# Patient Record
Sex: Male | Born: 1960 | Race: White | Hispanic: No | Marital: Married | Smoking: Former smoker
Health system: Southern US, Community
[De-identification: ages and names within clinical notes are randomized; demographics above are authoritative.]

## PROBLEM LIST (undated history)

## (undated) DIAGNOSIS — M169 Osteoarthritis of hip, unspecified: Secondary | ICD-10-CM

## (undated) DIAGNOSIS — N200 Calculus of kidney: Secondary | ICD-10-CM

## (undated) DIAGNOSIS — K4090 Unilateral inguinal hernia, without obstruction or gangrene, not specified as recurrent: Secondary | ICD-10-CM

## (undated) DIAGNOSIS — I10 Essential (primary) hypertension: Secondary | ICD-10-CM

## (undated) DIAGNOSIS — R011 Cardiac murmur, unspecified: Secondary | ICD-10-CM

## (undated) DIAGNOSIS — E119 Type 2 diabetes mellitus without complications: Secondary | ICD-10-CM

## (undated) DIAGNOSIS — G4730 Sleep apnea, unspecified: Secondary | ICD-10-CM

## (undated) HISTORY — PX: COLONOSCOPY: SHX174

---

## 2008-06-10 ENCOUNTER — Ambulatory Visit: Payer: Self-pay | Admitting: Unknown Physician Specialty

## 2009-09-02 ENCOUNTER — Ambulatory Visit: Payer: Self-pay | Admitting: Unknown Physician Specialty

## 2010-10-01 DIAGNOSIS — Z794 Long term (current) use of insulin: Secondary | ICD-10-CM | POA: Insufficient documentation

## 2010-10-01 DIAGNOSIS — E7800 Pure hypercholesterolemia, unspecified: Secondary | ICD-10-CM | POA: Insufficient documentation

## 2010-10-01 DIAGNOSIS — I10 Essential (primary) hypertension: Secondary | ICD-10-CM | POA: Insufficient documentation

## 2010-12-10 ENCOUNTER — Ambulatory Visit: Payer: Self-pay | Admitting: Gastroenterology

## 2013-01-26 ENCOUNTER — Other Ambulatory Visit: Payer: Self-pay | Admitting: Orthopaedic Surgery

## 2013-01-26 DIAGNOSIS — M25552 Pain in left hip: Secondary | ICD-10-CM

## 2013-02-03 ENCOUNTER — Other Ambulatory Visit: Payer: Self-pay

## 2013-02-05 ENCOUNTER — Ambulatory Visit
Admission: RE | Admit: 2013-02-05 | Discharge: 2013-02-05 | Disposition: A | Discharge status: Home / Self Care | Payer: BC Managed Care – PPO | Source: Ambulatory Visit | Attending: Orthopaedic Surgery | Admitting: Orthopaedic Surgery

## 2013-02-05 DIAGNOSIS — M25552 Pain in left hip: Secondary | ICD-10-CM

## 2013-02-20 ENCOUNTER — Encounter (HOSPITAL_COMMUNITY): Payer: Self-pay | Admitting: Pharmacy Technician

## 2013-02-20 ENCOUNTER — Other Ambulatory Visit (HOSPITAL_COMMUNITY): Payer: Self-pay | Admitting: Orthopaedic Surgery

## 2013-02-21 ENCOUNTER — Encounter (HOSPITAL_COMMUNITY)
Admission: RE | Admit: 2013-02-21 | Discharge: 2013-02-21 | Disposition: A | Discharge status: Home / Self Care | Payer: BC Managed Care – PPO | Source: Ambulatory Visit | Attending: Orthopaedic Surgery | Admitting: Orthopaedic Surgery

## 2013-02-21 ENCOUNTER — Encounter (HOSPITAL_COMMUNITY): Payer: Self-pay

## 2013-02-21 DIAGNOSIS — Z01818 Encounter for other preprocedural examination: Secondary | ICD-10-CM | POA: Insufficient documentation

## 2013-02-21 DIAGNOSIS — Z01810 Encounter for preprocedural cardiovascular examination: Secondary | ICD-10-CM | POA: Insufficient documentation

## 2013-02-21 DIAGNOSIS — Z01811 Encounter for preprocedural respiratory examination: Secondary | ICD-10-CM | POA: Insufficient documentation

## 2013-02-21 DIAGNOSIS — Z01812 Encounter for preprocedural laboratory examination: Secondary | ICD-10-CM | POA: Insufficient documentation

## 2013-02-21 HISTORY — DX: Cardiac murmur, unspecified: R01.1

## 2013-02-21 HISTORY — DX: Calculus of kidney: N20.0

## 2013-02-21 HISTORY — DX: Osteoarthritis of hip, unspecified: M16.9

## 2013-02-21 HISTORY — DX: Unilateral inguinal hernia, without obstruction or gangrene, not specified as recurrent: K40.90

## 2013-02-21 HISTORY — DX: Essential (primary) hypertension: I10

## 2013-02-21 HISTORY — DX: Type 2 diabetes mellitus without complications: E11.9

## 2013-02-21 HISTORY — DX: Sleep apnea, unspecified: G47.30

## 2013-02-21 LAB — PROTIME-INR
INR: 0.93 (ref 0.00–1.49)
PROTHROMBIN TIME: 12.3 s (ref 11.6–15.2)

## 2013-02-21 LAB — TYPE AND SCREEN
ABO/RH(D): O POS
Antibody Screen: NEGATIVE

## 2013-02-21 LAB — SURGICAL PCR SCREEN
MRSA, PCR: NEGATIVE
Staphylococcus aureus: NEGATIVE

## 2013-02-21 LAB — COMPREHENSIVE METABOLIC PANEL
ALK PHOS: 94 U/L (ref 39–117)
ALT: 26 U/L (ref 0–53)
AST: 20 U/L (ref 0–37)
Albumin: 4.2 g/dL (ref 3.5–5.2)
BUN: 26 mg/dL — AB (ref 6–23)
CO2: 26 mEq/L (ref 19–32)
Calcium: 9.4 mg/dL (ref 8.4–10.5)
Chloride: 99 mEq/L (ref 96–112)
Creatinine, Ser: 0.91 mg/dL (ref 0.50–1.35)
GFR calc non Af Amer: >90 mL/min (ref >90)
GLUCOSE: 246 mg/dL — AB (ref 70–99)
POTASSIUM: 4 meq/L (ref 3.7–5.3)
Sodium: 140 mEq/L (ref 137–147)
Total Bilirubin: 0.2 mg/dL — ABNORMAL LOW (ref 0.3–1.2)
Total Protein: 7.5 g/dL (ref 6.0–8.3)

## 2013-02-21 LAB — CBC
HEMATOCRIT: 42.2 % (ref 39.0–52.0)
Hemoglobin: 14.7 g/dL (ref 13.0–17.0)
MCH: 30.8 pg (ref 26.0–34.0)
MCHC: 34.8 g/dL (ref 30.0–36.0)
MCV: 88.5 fL (ref 78.0–100.0)
PLATELETS: 249 10*3/uL (ref 150–400)
RBC: 4.77 MIL/uL (ref 4.22–5.81)
RDW: 12.7 % (ref 11.5–15.5)
WBC: 11.7 10*3/uL — ABNORMAL HIGH (ref 4.0–10.5)

## 2013-02-21 LAB — URINALYSIS, ROUTINE W REFLEX MICROSCOPIC
BILIRUBIN URINE: NEGATIVE
Ketones, ur: NEGATIVE mg/dL
Leukocytes, UA: NEGATIVE
Nitrite: NEGATIVE
Protein, ur: NEGATIVE mg/dL
Specific Gravity, Urine: 1.024 (ref 1.005–1.030)
UROBILINOGEN UA: 0.2 mg/dL (ref 0.0–1.0)
pH: 5.5 (ref 5.0–8.0)

## 2013-02-21 LAB — URINE MICROSCOPIC-ADD ON

## 2013-02-21 LAB — APTT: aPTT: 33 seconds (ref 24–37)

## 2013-02-21 LAB — ABO/RH: ABO/RH(D): O POS

## 2013-02-21 NOTE — Pre-Procedure Instructions (Signed)
[name] CampbellDaniel [name]  02/21/2013   Your procedure is scheduled on:  Friday, March 02, 2013 at 7:30 AM  [facility] (use Main Entrance "A'') at 5:30 AM.  Call this number if you have problems the morning of surgery: [phone]   Remember:   Do not eat food or drink liquids after midnight.   Take these medicines the morning of surgery with A SIP OF WATER: allopurinol (ZYLOPRIM) 100 MG tablet, carvedilol (COREG) 6.25 MG tablet,  Stop taking Aspirin and herbal medications. Do not take any NSAIDs ie: Ibuprofen, Advil, Naproxen ( Anaprox) or any medication containing Aspirin   Do not wear jewelry, make-up or nail polish.  Do not wear lotions, powders, or perfumes. You may wear deodorant.  Do not shave 48 hours prior to surgery. Men may shave face and neck.  Do not bring valuables to the hospital.  [facility] is not responsible for any belongings or valuables.               Contacts, dentures or bridgework may not be worn into surgery.  Leave suitcase in the car. After surgery it may be brought to your room.  For patients admitted to the hospital, discharge time is determined by your treatment team.               Patients discharged the day of surgery will not be allowed to drive home.  Name and phone number of your driver:   Special Instructions: Shower using CHG the night before surgery and the morning of surgery.   Please read over the following fact sheets that you were given: Pain Booklet, Coughing and Deep Breathing, Blood Transfusion Information, Total Joint Packet, MRSA Information and Surgical Site Infection Prevention

## 2013-03-01 MED ORDER — VANCOMYCIN HCL 10 G IV SOLR
1500.0000 mg | INTRAVENOUS | Status: AC
  Administered 2013-03-02: 1500 mg via INTRAVENOUS
  Filled 2013-03-01: qty 1500

## 2013-03-01 NOTE — H&P (Signed)
TOTAL HIP ADMISSION H&P  Patient is admitted for left total hip arthroplasty.  Subjective:  Chief Complaint: left hip pain  HPI: [name] [name], 53 y.o. male, has a history of pain and functional disability in the left hip(s) due to arthritis and patient has failed non-surgical conservative treatments for greater than 12 weeks to include NSAID's and/or analgesics, corticosteriod injections and activity modification.  Onset of symptoms was gradual starting 2 years ago with gradually worsening course since that time.The patient noted no past surgery on the left hip(s).  Patient currently rates pain in the left hip at 6 out of 10 with activity. Patient has night pain, worsening of pain with activity and weight bearing and pain that interfers with activities of daily living. Patient has evidence of subchondral sclerosis, periarticular osteophytes and joint space narrowing by imaging studies. This condition presents safety issues increasing the risk of falls.  There is no current active infection.  There are no active problems to display for this patient.  Past Medical History  Diagnosis Date  . Hypertension   . Diabetes mellitus without complication   . OA (osteoarthritis) of hip     Hx: of  . Heart murmur     Hx: of as achild  . Sleep apnea   . Kidney stones     Hx: of  . Inguinal hernia     Hx: of left groin    Past Surgical History  Procedure Laterality Date  . Colonoscopy      Hx: of    No prescriptions prior to admission   Allergies  Allergen Reactions  . Shellfish Allergy Anaphylaxis  . Penicillins Hives    1977    History  Substance Use Topics  . Smoking status: Former Smoker    Types: Cigars, Cigarettes  . Smokeless tobacco: Current User    Types: Snuff     Comment: Quit smoking 1998  . Alcohol Use: Yes     Comment: once a week     Family History  Problem Relation Age of Onset  . Diabetes Mother   . Hypertension Mother   . Cancer - Other Mother   . Cancer -  Lung Father   . Arthritis Sister   . Heart disease Sister      Review of Systems  Musculoskeletal: Positive for joint pain.  All other systems reviewed and are negative.    Objective:  Physical Exam  Constitutional: He is oriented to person, place, and time. He appears well-developed and well-nourished.  HENT:  Head: Normocephalic and atraumatic.  Eyes: EOM are normal. Pupils are equal, round, and reactive to light.  Neck: Normal range of motion.  Cardiovascular: Normal rate.   Respiratory: Effort normal.  GI: Soft.  Musculoskeletal:  Painful and limited ROM of left hip. -SLR.  No focal weakness of LEs.  Neurological: He is alert and oriented to person, place, and time.  Skin: Skin is warm and dry.    Vital signs in last 24 hours:    Labs:   There is no height or weight on file to calculate BMI.   Imaging Review Plain radiographs demonstrate moderate degenerative joint disease of the left hip(s). The bone quality appears to be adequate for age and reported activity level.  Assessment/Plan:  End stage arthritis, left hip(s)  The patient history, physical examination, clinical judgement of the provider and imaging studies are consistent with end stage degenerative joint disease of the left hip(s) and total hip arthroplasty is deemed medically necessary.  The treatment options including medical management, injection therapy, arthroscopy and arthroplasty were discussed at length. The risks and benefits of total hip arthroplasty were presented and reviewed. The risks due to aseptic loosening, infection, stiffness, dislocation/subluxation,  thromboembolic complications and other imponderables were discussed.  The patient acknowledged the explanation, agreed to proceed with the plan and consent was signed. Patient is being admitted for inpatient treatment for surgery, pain control, PT, OT, prophylactic antibiotics, VTE prophylaxis, progressive ambulation and ADL's and discharge  planning.The patient is planning to be discharged home with home health services

## 2013-03-02 ENCOUNTER — Inpatient Hospital Stay (HOSPITAL_COMMUNITY): Payer: BC Managed Care – PPO | Admitting: Certified Registered Nurse Anesthetist

## 2013-03-02 ENCOUNTER — Inpatient Hospital Stay (HOSPITAL_COMMUNITY)
Admission: RE | Admit: 2013-03-02 | Discharge: 2013-03-05 | DRG: 470 | Disposition: A | Discharge status: Home / Self Care | Payer: BC Managed Care – PPO | Source: Ambulatory Visit | Attending: Orthopaedic Surgery | Admitting: Orthopaedic Surgery

## 2013-03-02 ENCOUNTER — Encounter (HOSPITAL_COMMUNITY)
Admission: RE | Disposition: A | Discharge status: Home / Self Care | Payer: Self-pay | Source: Ambulatory Visit | Attending: Orthopaedic Surgery

## 2013-03-02 ENCOUNTER — Inpatient Hospital Stay (HOSPITAL_COMMUNITY): Payer: BC Managed Care – PPO

## 2013-03-02 ENCOUNTER — Encounter (HOSPITAL_COMMUNITY): Payer: Self-pay | Admitting: Anesthesiology

## 2013-03-02 ENCOUNTER — Encounter (HOSPITAL_COMMUNITY): Payer: BC Managed Care – PPO | Admitting: Certified Registered Nurse Anesthetist

## 2013-03-02 DIAGNOSIS — M1610 Unilateral primary osteoarthritis, unspecified hip: Principal | ICD-10-CM | POA: Diagnosis present

## 2013-03-02 DIAGNOSIS — G4730 Sleep apnea, unspecified: Secondary | ICD-10-CM | POA: Diagnosis present

## 2013-03-02 DIAGNOSIS — I10 Essential (primary) hypertension: Secondary | ICD-10-CM | POA: Diagnosis present

## 2013-03-02 DIAGNOSIS — E119 Type 2 diabetes mellitus without complications: Secondary | ICD-10-CM | POA: Diagnosis present

## 2013-03-02 DIAGNOSIS — Z87891 Personal history of nicotine dependence: Secondary | ICD-10-CM

## 2013-03-02 DIAGNOSIS — M1611 Unilateral primary osteoarthritis, right hip: Secondary | ICD-10-CM | POA: Diagnosis present

## 2013-03-02 DIAGNOSIS — M169 Osteoarthritis of hip, unspecified: Principal | ICD-10-CM | POA: Diagnosis present

## 2013-03-02 DIAGNOSIS — M1612 Unilateral primary osteoarthritis, left hip: Secondary | ICD-10-CM

## 2013-03-02 HISTORY — PX: TOTAL HIP ARTHROPLASTY: SHX124

## 2013-03-02 LAB — GLUCOSE, CAPILLARY
GLUCOSE-CAPILLARY: 122 mg/dL — AB (ref 70–99)
GLUCOSE-CAPILLARY: 98 mg/dL (ref 70–99)
GLUCOSE-CAPILLARY: 223 mg/dL — AB (ref 70–99)
Glucose-Capillary: 114 mg/dL — ABNORMAL HIGH (ref 70–99)
Glucose-Capillary: 164 mg/dL — ABNORMAL HIGH (ref 70–99)

## 2013-03-02 SURGERY — ARTHROPLASTY, HIP, TOTAL, ANTERIOR APPROACH
Anesthesia: General | Site: Hip | Laterality: Left

## 2013-03-02 MED ORDER — CARVEDILOL 6.25 MG PO TABS
6.2500 mg | ORAL_TABLET | Freq: Two times a day (BID) | ORAL | Status: DC
  Administered 2013-03-02 – 2013-03-05 (×6): 6.25 mg via ORAL
  Filled 2013-03-02 (×8): qty 1

## 2013-03-02 MED ORDER — FLEET ENEMA 7-19 GM/118ML RE ENEM: 1.0000 | ENEMA | Freq: Once | RECTAL | Status: AC | PRN

## 2013-03-02 MED ORDER — METHOCARBAMOL 100 MG/ML IJ SOLN
500.0000 mg | Freq: Four times a day (QID) | INTRAVENOUS | Status: DC | PRN
  Filled 2013-03-02: qty 5

## 2013-03-02 MED ORDER — ACETAMINOPHEN 650 MG RE SUPP: 650.0000 mg | Freq: Four times a day (QID) | RECTAL | Status: DC | PRN

## 2013-03-02 MED ORDER — PROPOFOL 10 MG/ML IV BOLUS
INTRAVENOUS | Status: AC
  Filled 2013-03-02: qty 20

## 2013-03-02 MED ORDER — LIDOCAINE HCL (CARDIAC) 20 MG/ML IV SOLN
INTRAVENOUS | Status: DC | PRN
  Administered 2013-03-02: 80 mg via INTRAVENOUS

## 2013-03-02 MED ORDER — KETOROLAC TROMETHAMINE 15 MG/ML IJ SOLN
15.0000 mg | Freq: Three times a day (TID) | INTRAMUSCULAR | Status: AC
  Administered 2013-03-02 – 2013-03-03 (×3): 15 mg via INTRAVENOUS
  Filled 2013-03-02 (×3): qty 1

## 2013-03-02 MED ORDER — HYDROMORPHONE HCL PF 1 MG/ML IJ SOLN
INTRAMUSCULAR | Status: AC
  Filled 2013-03-02: qty 1

## 2013-03-02 MED ORDER — METFORMIN HCL 500 MG PO TABS
2000.0000 mg | ORAL_TABLET | Freq: Two times a day (BID) | ORAL | Status: DC
  Administered 2013-03-03: 1000 mg via ORAL
  Filled 2013-03-02 (×3): qty 4

## 2013-03-02 MED ORDER — ZOLPIDEM TARTRATE 5 MG PO TABS: 5.0000 mg | ORAL_TABLET | Freq: Every evening | ORAL | Status: DC | PRN

## 2013-03-02 MED ORDER — ONDANSETRON HCL 4 MG/2ML IJ SOLN
INTRAMUSCULAR | Status: AC
  Filled 2013-03-02: qty 2

## 2013-03-02 MED ORDER — SUCCINYLCHOLINE CHLORIDE 20 MG/ML IJ SOLN
INTRAMUSCULAR | Status: AC
  Filled 2013-03-02: qty 1

## 2013-03-02 MED ORDER — GLIMEPIRIDE 4 MG PO TABS
4.0000 mg | ORAL_TABLET | Freq: Every day | ORAL | Status: DC
  Administered 2013-03-03 – 2013-03-05 (×3): 4 mg via ORAL
  Filled 2013-03-02 (×4): qty 1

## 2013-03-02 MED ORDER — BISACODYL 10 MG RE SUPP: 10.0000 mg | Freq: Every day | RECTAL | Status: DC | PRN

## 2013-03-02 MED ORDER — SODIUM CHLORIDE 0.9 % IJ SOLN
INTRAMUSCULAR | Status: AC
  Filled 2013-03-02: qty 10

## 2013-03-02 MED ORDER — METHOCARBAMOL 500 MG PO TABS
ORAL_TABLET | ORAL | Status: AC
  Filled 2013-03-02: qty 1

## 2013-03-02 MED ORDER — LACTATED RINGERS IV SOLN
INTRAVENOUS | Status: DC | PRN
  Administered 2013-03-02 (×3): via INTRAVENOUS

## 2013-03-02 MED ORDER — ATORVASTATIN CALCIUM 10 MG PO TABS
10.0000 mg | ORAL_TABLET | Freq: Every day | ORAL | Status: DC
  Administered 2013-03-02 – 2013-03-05 (×4): 10 mg via ORAL
  Filled 2013-03-02 (×4): qty 1

## 2013-03-02 MED ORDER — EPHEDRINE SULFATE 50 MG/ML IJ SOLN
INTRAMUSCULAR | Status: AC
  Filled 2013-03-02: qty 1

## 2013-03-02 MED ORDER — LOSARTAN POTASSIUM 50 MG PO TABS
100.0000 mg | ORAL_TABLET | Freq: Every day | ORAL | Status: DC
  Administered 2013-03-02 – 2013-03-05 (×4): 100 mg via ORAL
  Filled 2013-03-02 (×4): qty 2

## 2013-03-02 MED ORDER — ONDANSETRON HCL 4 MG/2ML IJ SOLN
INTRAMUSCULAR | Status: DC | PRN
  Administered 2013-03-02: 4 mg via INTRAVENOUS

## 2013-03-02 MED ORDER — LIDOCAINE HCL (CARDIAC) 20 MG/ML IV SOLN
INTRAVENOUS | Status: AC
  Filled 2013-03-02: qty 5

## 2013-03-02 MED ORDER — 0.9 % SODIUM CHLORIDE (POUR BTL) OPTIME
TOPICAL | Status: DC | PRN
  Administered 2013-03-02: 1000 mL

## 2013-03-02 MED ORDER — MIDAZOLAM HCL 5 MG/5ML IJ SOLN
INTRAMUSCULAR | Status: DC | PRN
  Administered 2013-03-02: 2 mg via INTRAVENOUS

## 2013-03-02 MED ORDER — FENTANYL CITRATE 0.05 MG/ML IJ SOLN
INTRAMUSCULAR | Status: DC | PRN
  Administered 2013-03-02 (×2): 100 ug via INTRAVENOUS
  Administered 2013-03-02: 50 ug via INTRAVENOUS
  Administered 2013-03-02 (×2): 100 ug via INTRAVENOUS
  Administered 2013-03-02: 50 ug via INTRAVENOUS

## 2013-03-02 MED ORDER — ONDANSETRON HCL 4 MG PO TABS: 4.0000 mg | ORAL_TABLET | Freq: Four times a day (QID) | ORAL | Status: DC | PRN

## 2013-03-02 MED ORDER — SENNOSIDES-DOCUSATE SODIUM 8.6-50 MG PO TABS: 1.0000 | ORAL_TABLET | Freq: Every evening | ORAL | Status: DC | PRN

## 2013-03-02 MED ORDER — FENTANYL CITRATE 0.05 MG/ML IJ SOLN
INTRAMUSCULAR | Status: AC
  Filled 2013-03-02: qty 5

## 2013-03-02 MED ORDER — METHOCARBAMOL 500 MG PO TABS
500.0000 mg | ORAL_TABLET | Freq: Four times a day (QID) | ORAL | Status: DC | PRN
  Administered 2013-03-02 – 2013-03-05 (×7): 500 mg via ORAL
  Filled 2013-03-02 (×6): qty 1

## 2013-03-02 MED ORDER — PROPOFOL 10 MG/ML IV BOLUS
INTRAVENOUS | Status: DC | PRN
  Administered 2013-03-02: 330 mg via INTRAVENOUS

## 2013-03-02 MED ORDER — HYDROMORPHONE HCL PF 1 MG/ML IJ SOLN
0.2500 mg | INTRAMUSCULAR | Status: DC | PRN
  Administered 2013-03-02 (×4): 0.5 mg via INTRAVENOUS

## 2013-03-02 MED ORDER — METOCLOPRAMIDE HCL 10 MG PO TABS: 5.0000 mg | ORAL_TABLET | Freq: Three times a day (TID) | ORAL | Status: DC | PRN

## 2013-03-02 MED ORDER — GLYCOPYRROLATE 0.2 MG/ML IJ SOLN
INTRAMUSCULAR | Status: AC
  Filled 2013-03-02: qty 2

## 2013-03-02 MED ORDER — INSULIN GLARGINE 100 UNIT/ML ~~LOC~~ SOLN
25.0000 [IU] | Freq: Every day | SUBCUTANEOUS | Status: DC
  Administered 2013-03-03 – 2013-03-05 (×3): 25 [IU] via SUBCUTANEOUS
  Filled 2013-03-02 (×3): qty 0.25

## 2013-03-02 MED ORDER — INSULIN ASPART 100 UNIT/ML ~~LOC~~ SOLN
0.0000 [IU] | Freq: Three times a day (TID) | SUBCUTANEOUS | Status: DC
  Administered 2013-03-02 – 2013-03-03 (×2): 3 [IU] via SUBCUTANEOUS
  Administered 2013-03-03 – 2013-03-04 (×3): 2 [IU] via SUBCUTANEOUS
  Administered 2013-03-04 – 2013-03-05 (×2): 3 [IU] via SUBCUTANEOUS

## 2013-03-02 MED ORDER — ASPIRIN EC 325 MG PO TBEC: 325.0000 mg | DELAYED_RELEASE_TABLET | Freq: Every day | ORAL | Status: DC

## 2013-03-02 MED ORDER — OXYCODONE-ACETAMINOPHEN 5-325 MG PO TABS: 1.0000 | ORAL_TABLET | ORAL | Status: DC | PRN

## 2013-03-02 MED ORDER — CHLORHEXIDINE GLUCONATE 4 % EX LIQD: 60.0000 mL | Freq: Once | CUTANEOUS | Status: DC

## 2013-03-02 MED ORDER — MENTHOL 3 MG MT LOZG
1.0000 | LOZENGE | OROMUCOSAL | Status: DC | PRN
  Filled 2013-03-02: qty 9

## 2013-03-02 MED ORDER — ROCURONIUM BROMIDE 50 MG/5ML IV SOLN
INTRAVENOUS | Status: AC
  Filled 2013-03-02: qty 1

## 2013-03-02 MED ORDER — ASPIRIN EC 325 MG PO TBEC
325.0000 mg | DELAYED_RELEASE_TABLET | Freq: Every day | ORAL | Status: DC
  Administered 2013-03-03 – 2013-03-05 (×3): 325 mg via ORAL
  Filled 2013-03-02 (×4): qty 1

## 2013-03-02 MED ORDER — BUPIVACAINE HCL (PF) 0.25 % IJ SOLN
INTRAMUSCULAR | Status: AC
  Filled 2013-03-02: qty 30

## 2013-03-02 MED ORDER — MIDAZOLAM HCL 2 MG/2ML IJ SOLN
INTRAMUSCULAR | Status: AC
  Filled 2013-03-02: qty 2

## 2013-03-02 MED ORDER — ONDANSETRON HCL 4 MG/2ML IJ SOLN: 4.0000 mg | Freq: Four times a day (QID) | INTRAMUSCULAR | Status: DC | PRN

## 2013-03-02 MED ORDER — KETOROLAC TROMETHAMINE 30 MG/ML IJ SOLN
INTRAMUSCULAR | Status: AC
  Filled 2013-03-02: qty 1

## 2013-03-02 MED ORDER — ALLOPURINOL 100 MG PO TABS
100.0000 mg | ORAL_TABLET | Freq: Every day | ORAL | Status: DC
  Administered 2013-03-02 – 2013-03-05 (×4): 100 mg via ORAL
  Filled 2013-03-02 (×4): qty 1

## 2013-03-02 MED ORDER — METOCLOPRAMIDE HCL 5 MG/ML IJ SOLN: 5.0000 mg | Freq: Three times a day (TID) | INTRAMUSCULAR | Status: DC | PRN

## 2013-03-02 MED ORDER — POTASSIUM CHLORIDE IN NACL 20-0.45 MEQ/L-% IV SOLN
INTRAVENOUS | Status: DC
  Administered 2013-03-02 – 2013-03-03 (×2): via INTRAVENOUS
  Filled 2013-03-02 (×7): qty 1000

## 2013-03-02 MED ORDER — ACETAMINOPHEN 325 MG PO TABS
650.0000 mg | ORAL_TABLET | Freq: Four times a day (QID) | ORAL | Status: DC | PRN
  Administered 2013-03-03 – 2013-03-04 (×3): 650 mg via ORAL
  Filled 2013-03-02 (×3): qty 2

## 2013-03-02 MED ORDER — EPHEDRINE SULFATE 50 MG/ML IJ SOLN
INTRAMUSCULAR | Status: DC | PRN
  Administered 2013-03-02 (×2): 10 mg via INTRAVENOUS

## 2013-03-02 MED ORDER — METHOCARBAMOL 500 MG PO TABS
500.0000 mg | ORAL_TABLET | Freq: Four times a day (QID) | ORAL | Status: DC | PRN
Start: 2013-03-02 — End: 2014-06-01

## 2013-03-02 MED ORDER — GLYCOPYRROLATE 0.2 MG/ML IJ SOLN
INTRAMUSCULAR | Status: DC | PRN
  Administered 2013-03-02: 0.4 mg via INTRAVENOUS

## 2013-03-02 MED ORDER — HYDROCHLOROTHIAZIDE 25 MG PO TABS
25.0000 mg | ORAL_TABLET | Freq: Every day | ORAL | Status: DC
  Administered 2013-03-02 – 2013-03-05 (×4): 25 mg via ORAL
  Filled 2013-03-02 (×4): qty 1

## 2013-03-02 MED ORDER — NEOSTIGMINE METHYLSULFATE 1 MG/ML IJ SOLN
INTRAMUSCULAR | Status: DC | PRN
Start: 2013-03-02 — End: 2013-03-02
  Administered 2013-03-02: 3 mg via INTRAVENOUS

## 2013-03-02 MED ORDER — ACETAMINOPHEN 10 MG/ML IV SOLN
INTRAVENOUS | Status: AC
  Filled 2013-03-02: qty 100

## 2013-03-02 MED ORDER — INSULIN GLARGINE 100 UNIT/ML ~~LOC~~ SOLN
75.0000 [IU] | Freq: Every day | SUBCUTANEOUS | Status: DC
  Administered 2013-03-02 – 2013-03-04 (×3): 75 [IU] via SUBCUTANEOUS
  Filled 2013-03-02 (×4): qty 0.75

## 2013-03-02 MED ORDER — MORPHINE SULFATE 2 MG/ML IJ SOLN: 2.0000 mg | INTRAMUSCULAR | Status: DC | PRN

## 2013-03-02 MED ORDER — ONDANSETRON HCL 4 MG/2ML IJ SOLN: 4.0000 mg | Freq: Once | INTRAMUSCULAR | Status: DC | PRN

## 2013-03-02 MED ORDER — PHENOL 1.4 % MT LIQD: 1.0000 | OROMUCOSAL | Status: DC | PRN

## 2013-03-02 MED ORDER — ACETAMINOPHEN 10 MG/ML IV SOLN
INTRAVENOUS | Status: DC | PRN
  Administered 2013-03-02: 1000 mg via INTRAVENOUS

## 2013-03-02 MED ORDER — SUCCINYLCHOLINE CHLORIDE 20 MG/ML IJ SOLN
INTRAMUSCULAR | Status: DC | PRN
  Administered 2013-03-02: 110 mg via INTRAVENOUS

## 2013-03-02 MED ORDER — DOCUSATE SODIUM 100 MG PO CAPS
100.0000 mg | ORAL_CAPSULE | Freq: Two times a day (BID) | ORAL | Status: DC
  Administered 2013-03-02 – 2013-03-04 (×3): 100 mg via ORAL
  Filled 2013-03-02 (×7): qty 1

## 2013-03-02 MED ORDER — OXYCODONE HCL 5 MG PO TABS
ORAL_TABLET | ORAL | Status: AC
  Filled 2013-03-02: qty 2

## 2013-03-02 MED ORDER — ROCURONIUM BROMIDE 100 MG/10ML IV SOLN
INTRAVENOUS | Status: DC | PRN
  Administered 2013-03-02 (×2): 50 mg via INTRAVENOUS

## 2013-03-02 MED ORDER — NEOSTIGMINE METHYLSULFATE 1 MG/ML IJ SOLN
INTRAMUSCULAR | Status: AC
  Filled 2013-03-02: qty 10

## 2013-03-02 MED ORDER — OXYCODONE HCL 5 MG PO TABS
5.0000 mg | ORAL_TABLET | ORAL | Status: DC | PRN
  Administered 2013-03-02 – 2013-03-05 (×11): 10 mg via ORAL
  Filled 2013-03-02 (×10): qty 2

## 2013-03-02 SURGICAL SUPPLY — 51 items
BLADE SAW SGTL 18X1.27X75 (BLADE) ×2 IMPLANT
BLADE SURG ROTATE 9660 (MISCELLANEOUS) IMPLANT
CAPT HIP PF MOP ×2 IMPLANT
CELLS DAT CNTRL 66122 CELL SVR (MISCELLANEOUS) ×1 IMPLANT
CLOTH BEACON ORANGE TIMEOUT ST (SAFETY) ×2 IMPLANT
COVER SURGICAL LIGHT HANDLE (MISCELLANEOUS) ×2 IMPLANT
DERMABOND ADHESIVE PROPEN (GAUZE/BANDAGES/DRESSINGS) ×1
DERMABOND ADVANCED .7 DNX6 (GAUZE/BANDAGES/DRESSINGS) ×1 IMPLANT
DRAPE C-ARM 42X72 X-RAY (DRAPES) ×2 IMPLANT
DRAPE STERI IOBAN 125X83 (DRAPES) ×2 IMPLANT
DRAPE U-SHAPE 47X51 STRL (DRAPES) ×6 IMPLANT
DRSG MEPILEX BORDER 4X8 (GAUZE/BANDAGES/DRESSINGS) ×2 IMPLANT
DURAPREP 26ML APPLICATOR (WOUND CARE) ×2 IMPLANT
ELECT BLADE 4.0 EZ CLEAN MEGAD (MISCELLANEOUS)
ELECT BLADE TIP CTD 4 INCH (ELECTRODE) ×2 IMPLANT
ELECT CAUTERY BLADE 6.4 (BLADE) ×2 IMPLANT
ELECT REM PT RETURN 9FT ADLT (ELECTROSURGICAL) ×2
ELECTRODE BLDE 4.0 EZ CLN MEGD (MISCELLANEOUS) IMPLANT
ELECTRODE REM PT RTRN 9FT ADLT (ELECTROSURGICAL) ×1 IMPLANT
FACESHIELD LNG OPTICON STERILE (SAFETY) ×4 IMPLANT
GAUZE XEROFORM 1X8 LF (GAUZE/BANDAGES/DRESSINGS) ×2 IMPLANT
GLOVE BIOGEL PI IND STRL 7.5 (GLOVE) ×1 IMPLANT
GLOVE BIOGEL PI IND STRL 8 (GLOVE) ×1 IMPLANT
GLOVE BIOGEL PI INDICATOR 7.5 (GLOVE) ×1
GLOVE BIOGEL PI INDICATOR 8 (GLOVE) ×1
GLOVE ECLIPSE 7.0 STRL STRAW (GLOVE) ×2 IMPLANT
GLOVE ORTHO TXT STRL SZ7.5 (GLOVE) ×2 IMPLANT
GOWN PREVENTION PLUS LG XLONG (DISPOSABLE) IMPLANT
GOWN STRL NON-REIN LRG LVL3 (GOWN DISPOSABLE) ×4 IMPLANT
GOWN STRL REIN XL XLG (GOWN DISPOSABLE) ×2 IMPLANT
KIT BASIN OR (CUSTOM PROCEDURE TRAY) ×2 IMPLANT
KIT ROOM TURNOVER OR (KITS) ×2 IMPLANT
MANIFOLD NEPTUNE II (INSTRUMENTS) ×2 IMPLANT
NS IRRIG 1000ML POUR BTL (IV SOLUTION) ×2 IMPLANT
PACK TOTAL JOINT (CUSTOM PROCEDURE TRAY) ×2 IMPLANT
PAD ARMBOARD 7.5X6 YLW CONV (MISCELLANEOUS) ×4 IMPLANT
RTRCTR WOUND ALEXIS 18CM MED (MISCELLANEOUS) ×2
SPONGE LAP 18X18 X RAY DECT (DISPOSABLE) ×2 IMPLANT
SPONGE LAP 4X18 X RAY DECT (DISPOSABLE) IMPLANT
STAPLER VISISTAT 35W (STAPLE) ×2 IMPLANT
SUT ETHIBOND NAB CT1 #1 30IN (SUTURE) IMPLANT
SUT VIC AB 0 CT1 27 (SUTURE) ×1
SUT VIC AB 0 CT1 27XBRD ANBCTR (SUTURE) ×1 IMPLANT
SUT VIC AB 2-0 CT1 27 (SUTURE) ×1
SUT VIC AB 2-0 CT1 TAPERPNT 27 (SUTURE) ×1 IMPLANT
SUT VICRYL 4-0 PS2 18IN ABS (SUTURE) ×2 IMPLANT
SUT VLOC 180 0 24IN GS25 (SUTURE) ×2 IMPLANT
TOWEL OR 17X24 6PK STRL BLUE (TOWEL DISPOSABLE) ×2 IMPLANT
TOWEL OR 17X26 10 PK STRL BLUE (TOWEL DISPOSABLE) ×4 IMPLANT
TRAY FOLEY CATH 16FRSI W/METER (SET/KITS/TRAYS/PACK) IMPLANT
WATER STERILE IRR 1000ML POUR (IV SOLUTION) ×4 IMPLANT

## 2013-03-02 NOTE — Brief Op Note (Cosign Needed)
03/02/2013  10:06 AM  PATIENT:  [name] [name]  53 y.o. male  PRE-OPERATIVE DIAGNOSIS:  Osteoarthritis Left Hip  POST-OPERATIVE DIAGNOSIS:  Osteoarthritis Left Hip  PROCEDURE:  Procedure(s) with comments: TOTAL HIP ARTHROPLASTY ANTERIOR APPROACH (Left) - Left Total Hip Arthroplasty Direct Anterior Approach  SURGEON:  Surgeon(s) and Role:    * Eldred MangesMark C Yates, MD - Primary  PHYSICIAN ASSISTANT: Maud DeedSheila Naeem Quillin [facility]  ASSISTANTS: none   ANESTHESIA:   general  EBL:  Total I/O In: 1000 [I.V.:1000] Out: -   BLOOD ADMINISTERED:none  DRAINS: none   LOCAL MEDICATIONS USED:  MARCAINE     SPECIMEN:  No Specimen  DISPOSITION OF SPECIMEN:  N/A  COUNTS:  YES  TOURNIQUET:  * No tourniquets in log *  DICTATION: .Note written in EPIC  PLAN OF CARE: Admit to inpatient   PATIENT DISPOSITION:  PACU - hemodynamically stable.   Delay start of Pharmacological VTE agent (>24hrs) due to surgical blood loss or risk of bleeding: no

## 2013-03-02 NOTE — Evaluation (Signed)
Physical Therapy Evaluation Patient Details Name: [name] [name] MRN: [id number] DOB: Jan 15, 1961 Today's Date: 03/02/2013 Time: 8295-6213 PT Time Calculation (min): 29 min  PT Assessment / Plan / Recommendation History of Present Illness  Patient is a 53 yo male s/p Lt THA (direct anterior approach - no precautions).  Clinical Impression  Patient presents with problems listed below.  Will benefit from acute PT to maximize functional independence prior to discharge home with wife.    PT Assessment  Patient needs continued PT services    Follow Up Recommendations  Home health PT;Supervision/Assistance - 24 hour    Does the patient have the potential to tolerate intense rehabilitation      Barriers to Discharge        Equipment Recommendations  Rolling walker with 5" wheels;3in1 (PT) (Bariatric equipment (patient is 6'2" and obese))    Recommendations for Other Services     Frequency 7X/week    Precautions / Restrictions Precautions Precautions: Fall Restrictions Weight Bearing Restrictions: Yes LLE Weight Bearing: Weight bearing as tolerated   Pertinent Vitals/Pain       Mobility  Bed Mobility Overal bed mobility: Needs Assistance;+2 for physical assistance Bed Mobility: Supine to Sit Supine to sit: Max assist;+2 for physical assistance General bed mobility comments: Verbal cues for mobility.  Assist to move LLE off of bed.  +2 assist to raise trunk to sitting position.  Once upright with feet on floor, patient able to maintain balance with standby assist. Transfers Overall transfer level: Needs assistance Equipment used: Rolling walker (2 wheeled) Transfers: Sit to/from Stand Sit to Stand: Mod assist;+2 physical assistance;From elevated surface General transfer comment: Raised bed to elevated level to assist with sit > stand.  Verbal cues for hand placement and technique.  Assist to rise to standing and for balance. Ambulation/Gait Ambulation/Gait assistance: Min  assist;+2 physical assistance Ambulation Distance (Feet): 4 Feet Assistive device: Rolling walker (2 wheeled) Gait Pattern/deviations: Step-to pattern;Decreased stance time - left;Decreased step length - right;Decreased weight shift to left;Antalgic;Trunk flexed Gait velocity: Slow General Gait Details: Verbal cues for safe use of RW and gait sequence.  Patient able to maneuver RW.  Patient able to ambulate 4' to chair.  Patient stood at chair x2 minutes to void in urinal.  Assist to control descent into chair.    Exercises Total Joint Exercises Ankle Circles/Pumps: AROM;Both;10 reps;Seated   PT Diagnosis: Difficulty walking;Acute pain  PT Problem List: Decreased strength;Decreased activity tolerance;Decreased balance;Decreased mobility;Decreased knowledge of use of DME;Obesity;Pain PT Treatment Interventions: DME instruction;Gait training;Stair training;Functional mobility training;Therapeutic exercise;Patient/family education     PT Goals(Current goals can be found in the care plan section) Acute Rehab PT Goals Patient Stated Goal: To return home PT Goal Formulation: With patient/family Time For Goal Achievement: 03/09/13 Potential to Achieve Goals: Good  Visit Information  Last PT Received On: 03/02/13 Assistance Needed: +2 History of Present Illness: Patient is a 53 yo male s/p Lt THA (direct anterior approach - no precautions).       Prior Functioning  Home Living Family/patient expects to be discharged to:: Private residence Living Arrangements: Spouse/significant other Available Help at Discharge: Family;Available 24 hours/day Type of Home: House Home Access: Stairs to enter [employer] of Steps: 4 (2 steps with no rail) Entrance Stairs-Rails: Right;Left Home Layout: Two level (Bedroom/bath down 3 steps with no rails) Alternate Level Stairs-Number of Steps: 3 Alternate Level Stairs-Rails: None Home Equipment: None Prior Function Level of Independence:  Independent Communication Communication: No difficulties    Cognition  Cognition Arousal/Alertness: Awake/alert Behavior During Therapy: WFL for tasks assessed/performed Overall Cognitive Status: Within Functional Limits for tasks assessed    Extremity/Trunk Assessment Upper Extremity Assessment Upper Extremity Assessment: Overall WFL for tasks assessed Lower Extremity Assessment Lower Extremity Assessment: LLE deficits/detail LLE Deficits / Details: Decreased strength and ROM due to surgery/pain.  Strength grossly 3-/5 LLE: Unable to fully assess due to pain LLE Coordination: decreased gross motor Cervical / Trunk Assessment Cervical / Trunk Assessment: Normal   Balance Balance Overall balance assessment: Needs assistance Sitting-balance support: No upper extremity supported;Feet supported Sitting balance-Leahy Scale: Good Standing balance support: Bilateral upper extremity supported Standing balance-Leahy Scale: Fair  End of Session PT - End of Session Equipment Utilized During Treatment: Gait belt;Oxygen Activity Tolerance: Patient limited by pain;Patient limited by fatigue Patient left: in chair;with call bell/phone within reach;with family/visitor present Nurse Communication: Mobility status  GP     Vena AustriaDavis, Myrtle Haller H 03/02/2013, 4:26 PM Durenda HurtSusan H. Renaldo Fiddleravis, PT, [facility] Acute Rehab Services Pager [phone]

## 2013-03-02 NOTE — Transfer of Care (Signed)
Immediate Anesthesia Transfer of Care Note  Patient: [name] CampbellDaniel [name]  Procedure(s) Performed: Procedure(s) with comments: TOTAL HIP ARTHROPLASTY ANTERIOR APPROACH (Left) - Left Total Hip Arthroplasty Direct Anterior Approach  Patient Location: PACU  Anesthesia Type:General  Level of Consciousness: awake, alert , oriented and patient cooperative  Airway & Oxygen Therapy: Patient Spontanous Breathing and Patient connected to nasal cannula oxygen  Post-op Assessment: Report given to PACU RN, Post -op Vital signs reviewed and stable and Patient moving all extremities  Post vital signs: Reviewed and stable  Complications: No apparent anesthesia complications

## 2013-03-02 NOTE — Anesthesia Postprocedure Evaluation (Signed)
  Anesthesia Post-op Note  Patient: [name] [name]  Procedure(s) Performed: Procedure(s) with comments: TOTAL HIP ARTHROPLASTY ANTERIOR APPROACH (Left) - Left Total Hip Arthroplasty Direct Anterior Approach  Patient Location: PACU  Anesthesia Type:General  Level of Consciousness: awake, alert , oriented and patient cooperative  Airway and Oxygen Therapy: Patient Spontanous Breathing  Post-op Pain: moderate  Post-op Assessment: Post-op Vital signs reviewed, Patient's Cardiovascular Status Stable, Respiratory Function Stable, Patent Airway, No signs of Nausea or vomiting and Pain level controlled  Post-op Vital Signs: stable  Complications: No apparent anesthesia complications

## 2013-03-02 NOTE — Anesthesia Preprocedure Evaluation (Signed)
Anesthesia Evaluation  Patient identified by MRN, date of birth, ID band Patient awake    Reviewed: Allergy & Precautions, H&P , NPO status , Patient's Chart, lab work & pertinent test results  Airway       Dental   Pulmonary sleep apnea , former smoker,          Cardiovascular hypertension,     Neuro/Psych    GI/Hepatic   Endo/Other  diabetes, Type 2, Insulin Dependent and Oral Hypoglycemic Agents  Renal/GU Renal disease     Musculoskeletal   Abdominal   Peds  Hematology   Anesthesia Other Findings   Reproductive/Obstetrics                           Anesthesia Physical Anesthesia Plan  ASA: III  Anesthesia Plan: General   Post-op Pain Management:    Induction: Intravenous  Airway Management Planned: Oral ETT  Additional Equipment:   Intra-op Plan:   Post-operative Plan: Extubation in OR  Informed Consent: I have reviewed the patients History and Physical, chart, labs and discussed the procedure including the risks, benefits and alternatives for the proposed anesthesia with the patient or authorized representative who has indicated his/her understanding and acceptance.     Plan Discussed with:   Anesthesia Plan Comments:         Anesthesia Quick Evaluation

## 2013-03-02 NOTE — Interval H&P Note (Signed)
History and Physical Interval Note:  03/02/2013 7:22 AM  [name] [name]  has presented today for surgery, with the diagnosis of Osteoarthritis Left Hip  The various methods of treatment have been discussed with the patient and family. After consideration of risks, benefits and other options for treatment, the patient has consented to  Procedure(s) with comments: TOTAL HIP ARTHROPLASTY ANTERIOR APPROACH (Left) - Left Total Hip Arthroplasty Direct Anterior Approach as a surgical intervention .  The patient's history has been reviewed, patient examined, no change in status, stable for surgery.  I have reviewed the patient's chart and labs.  Questions were answered to the patient's satisfaction.     Sheng Pritz C

## 2013-03-02 NOTE — Discharge Instructions (Signed)
Keep hip incision dry for 5 days post op then may wet while bathing. Change dressing daily or as needed. Therapy daily . Call if fever or chills or increased drainage. Go to ER if acutely short of breath or call for ambulance. Return for follow up in 2 weeks. May full weight bear on the surgical leg unless told otherwise. In house walking for first 2 weeks.

## 2013-03-02 NOTE — Progress Notes (Signed)
Patient ID: [name] CampbellDaniel Kronk, male   DOB: 04-Apr-1960, 53 y.o.   MRN: [id number] Anticipate discharge to home, possibly over weekend if pt does well.   RX on chart  OV 2 weeks.

## 2013-03-03 LAB — CBC
HCT: 34.9 % — ABNORMAL LOW (ref 39.0–52.0)
Hemoglobin: 11.8 g/dL — ABNORMAL LOW (ref 13.0–17.0)
MCH: 30.3 pg (ref 26.0–34.0)
MCHC: 33.8 g/dL (ref 30.0–36.0)
MCV: 89.5 fL (ref 78.0–100.0)
Platelets: 193 K/uL (ref 150–400)
RBC: 3.9 MIL/uL — ABNORMAL LOW (ref 4.22–5.81)
RDW: 12.9 % (ref 11.5–15.5)
WBC: 8.2 K/uL (ref 4.0–10.5)

## 2013-03-03 LAB — BASIC METABOLIC PANEL
BUN: 14 mg/dL (ref 6–23)
CALCIUM: 8.2 mg/dL — AB (ref 8.4–10.5)
CO2: 30 meq/L (ref 19–32)
Chloride: 98 mEq/L (ref 96–112)
Creatinine, Ser: 0.99 mg/dL (ref 0.50–1.35)
GFR calc Af Amer: >90 mL/min (ref >90)
Glucose, Bld: 223 mg/dL — ABNORMAL HIGH (ref 70–99)
POTASSIUM: 3.7 meq/L (ref 3.7–5.3)
SODIUM: 140 meq/L (ref 137–147)

## 2013-03-03 LAB — GLUCOSE, CAPILLARY
Glucose-Capillary: 164 mg/dL — ABNORMAL HIGH (ref 70–99)
Glucose-Capillary: 110 mg/dL — ABNORMAL HIGH (ref 70–99)
Glucose-Capillary: 143 mg/dL — ABNORMAL HIGH (ref 70–99)
Glucose-Capillary: 156 mg/dL — ABNORMAL HIGH (ref 70–99)

## 2013-03-03 MED ORDER — METFORMIN HCL 500 MG PO TABS
1000.0000 mg | ORAL_TABLET | Freq: Two times a day (BID) | ORAL | Status: DC
  Administered 2013-03-03 – 2013-03-05 (×4): 1000 mg via ORAL
  Filled 2013-03-03 (×6): qty 2

## 2013-03-03 NOTE — Progress Notes (Signed)
Subjective: Pt stable - walked in halls   Objective: Vital signs in last 24 hours: Temp:  [97.6 F (36.4 C)-99.7 F (37.6 C)] 99.7 F (37.6 C) (02/07 0800) Pulse Rate:  [79-99] 99 (02/07 1000) Resp:  [12-19] 18 (02/07 1000) BP: (124-166)/(63-83) 127/78 mmHg (02/07 1000) SpO2:  [95 %-99 %] 98 % (02/07 1000)  Intake/Output from previous day: 02/06 0701 - 02/07 0700 In: 4162.5 [P.O.:680; I.V.:3482.5] Out: 2200 [Urine:1750; Blood:450] Intake/Output this shift:    Exam:  Neurovascular intact Sensation intact distally Intact pulses distally  Labs:  Recent Labs  03/03/13 0325  HGB 11.8*    Recent Labs  03/03/13 0325  WBC 8.2  RBC 3.90*  HCT 34.9*  PLT 193    Recent Labs  03/03/13 0325  NA 140  K 3.7  CL 98  CO2 30  BUN 14  CREATININE 0.99  GLUCOSE 223*  CALCIUM 8.2*   No results found for this basename: LABPT, INR,  in the last 72 hours  Assessment/Plan: Doing well plan for possible dc mon    [name] [name] 03/03/2013, 10:46 AM

## 2013-03-03 NOTE — Progress Notes (Signed)
Physical Therapy Treatment Patient Details Name: [name] [name] MRN: [id number] DOB: 1960-04-27 Today's Date: 03/03/2013 Time: 4098-1191 PT Time Calculation (min): 23 min  PT Assessment / Plan / Recommendation  History of Present Illness Patient is a 53 yo male s/p Lt THA (direct anterior approach - no precautions).   PT Comments   Pt. Initially stiff and needed to walk about 50 feet before he was moving L LE more freely.  As he tired, he positioned himself too far behind RW and needed cues for safe technique.  Overall making progress.  Follow Up Recommendations  Home health PT;Supervision/Assistance - 24 hour     Does the patient have the potential to tolerate intense rehabilitation     Barriers to Discharge        Equipment Recommendations  Rolling walker with 5" wheels;3in1 (PT)    Recommendations for Other Services    Frequency 7X/week   Progress towards PT Goals Progress towards PT goals: Progressing toward goals  Plan Current plan remains appropriate    Precautions / Restrictions Precautions Precautions: Fall Restrictions Weight Bearing Restrictions: Yes LLE Weight Bearing: Weight bearing as tolerated   Pertinent Vitals/Pain See vitals tab Pain appears well controlled    Mobility  Bed Mobility Overal bed mobility: Needs Assistance;+2 for physical assistance Bed Mobility: Sit to Supine Supine to sit: +2 for physical assistance;Min assist Sit to supine: +2 for physical assistance;Mod assist General bed mobility comments: Pt. needed mod assist at each LE to lie back down from sitting at EOB; pt. able to manage his own upper body.   Transfers Overall transfer level: Needs assistance Equipment used: Rolling walker (2 wheeled) Transfers: Sit to/from Stand Sit to Stand: Min assist General transfer comment: min assist from recliner and to bed with cues for hand placement and L LE placement for comfort Ambulation/Gait Ambulation/Gait assistance: Min assist;+2  safety/equipment (second person for IV, recliner chair) Ambulation Distance (Feet): 200 Feet Assistive device: Rolling walker (2 wheeled) Gait Pattern/deviations: Step-to pattern;Antalgic;Decreased step length - right;Decreased step length - left Gait velocity: Slow General Gait Details: Pt. had been sitting in recliner chair for several hours and had difficulty getting moving.  Needed increased time to begin to be able to step and swing with L LE but after about 50 feet he was doing so more freely and with less effort.  Pt. tends to get too far behind RW and needs cues to stpe up into RW.    Exercises Total Joint Exercises Ankle Circles/Pumps: AROM;Both;20 reps Quad Sets: AROM;Both;10 reps Short Arc Quad: AROM;Left;10 reps;Supine Straight Leg Raises: AAROM;Left;5 reps;Supine Long Arc Quad: AROM;Left;10 reps   PT Diagnosis:    PT Problem List:   PT Treatment Interventions:     PT Goals (current goals can now be found in the care plan section) Acute Rehab PT Goals Patient Stated Goal: get back to being independent  Visit Information  Last PT Received On: 03/03/13 Assistance Needed: +2 History of Present Illness: Patient is a 53 yo male s/p Lt THA (direct anterior approach - no precautions).    Subjective Data  Subjective: Pt. reports he recently got pain med and is at a 2/10 pain level Patient Stated Goal: get back to being independent   Cognition  Cognition Arousal/Alertness: Awake/alert Behavior During Therapy: WFL for tasks assessed/performed Overall Cognitive Status: Within Functional Limits for tasks assessed    Balance     End of Session PT - End of Session Equipment Utilized During Treatment: Gait belt Activity Tolerance: Patient  tolerated treatment well Patient left: in chair;with call bell/phone within reach Nurse Communication: Mobility status   GP     Ferman HammingBlankenship, TRUE Garciamartinez B 03/03/2013, 2:32 PM Weldon PickingSusan Fadumo Heng PT Acute Rehab Services [phone] Bee[place]  [phone]

## 2013-03-03 NOTE — Progress Notes (Signed)
Physical Therapy Treatment Patient Details Name: [name] [name] MRN: [id number] DOB: [birth year] Today's Date: 03/03/2013 Time: [id number] PT Time Calculation (min): 25 min  PT Assessment / Plan / Recommendation  History of Present Illness Patient is a 53 yo male s/p Lt THA (direct anterior approach - no precautions).   PT Comments   Pt's ambulation improving over yesterday evening.  Good progress with PT.  Transitions most difficult for him.  Follow Up Recommendations  Home health PT;Supervision/Assistance - 24 hour     Does the patient have the potential to tolerate intense rehabilitation     Barriers to Discharge        Equipment Recommendations  Rolling walker with 5" wheels;3in1 (PT)    Recommendations for Other Services    Frequency 7X/week   Progress towards PT Goals Progress towards PT goals: Progressing toward goals  Plan Current plan remains appropriate    Precautions / Restrictions Precautions Precautions: Fall Restrictions Weight Bearing Restrictions: Yes LLE Weight Bearing: Weight bearing as tolerated   Pertinent Vitals/Pain 03/03/2013 See vitals tab       Mobility  Bed Mobility Overal bed mobility: Needs Assistance;+2 for physical assistance Bed Mobility: Supine to Sit Supine to sit: +2 for physical assistance;Min assist General bed mobility comments: Pt. needed min assist for moving L LE to edge of bed and min asssist at shoulders to move to sitting position Transfers Overall transfer level: Needs assistance Equipment used: Rolling walker (2 wheeled) Transfers: Sit to/from Stand Sit to Stand: Min assist;+2 physical assistance General transfer comment: With bed raised, pt . needed min assist of 2 to rise to stand with assist to power up to standing postition Ambulation/Gait Ambulation/Gait assistance: Min assist;+2 safety/equipment Ambulation Distance (Feet): 150 Feet Assistive device: Rolling walker (2 wheeled) Gait Pattern/deviations: Step-to  pattern Gait velocity: Slow General Gait Details: Pt. able to walk 150 with slow pace and at min assist level, second person for recliner chair and for safety    Exercises Total Joint Exercises Ankle Circles/Pumps: AROM;Both;20 reps Quad Sets: AROM;Both;10 reps Short Arc Quad: AROM;Left;10 reps;Supine Long Arc Quad: AROM;Left;10 reps   PT Diagnosis:    PT Problem List:   PT Treatment Interventions:     PT Goals (current goals can now be found in the care plan section)    Visit Information  Last PT Received On: 03/03/13 Assistance Needed: +2 History of Present Illness: Patient is a 53 yo male s/p Lt THA (direct anterior approach - no precautions).    Subjective Data  Subjective: Pt. presents in bed and willing to participate in PT session   Cognition  Cognition Arousal/Alertness: Awake/alert Behavior During Therapy: [facility] tasks assessed/performed Overall Cognitive Status: Within Functional Limits for tasks assessed    Balance     End of Session PT - End of Session Equipment Utilized During Treatment: Gait belt Activity Tolerance: Patient tolerated treatment well Patient left: in chair;with call bell/phone within reach Nurse Communication: Mobility status   GP     Ferman HammingBlankenship, [name] Sieh B 03/03/2013, 1:56 PM Weldon PickingSusan Silus [name] PT Acute Rehab Services [phone] [place] [phone]

## 2013-03-03 NOTE — Evaluation (Signed)
Occupational Therapy Evaluation Patient Details Name: [name] [name] MRN: [id number] DOB: 1960-11-20 Today's Date: 03/03/2013 Time: 3710-6269 OT Time Calculation (min): 29 min  OT Assessment / Plan / Recommendation History of present illness Patient is a 53 yo male s/p Lt THA (direct anterior approach - no precautions).   Clinical Impression   Pt was admitted for the above surgery.  He will benefit from skilled OT to practice/review bathroom transfers while in acute.  Recommend wide 3:1 for home.      OT Assessment  Patient needs continued OT Services    Follow Up Recommendations  No OT follow up    Barriers to Discharge      Equipment Recommendations  3 in 1 bedside comode (wide)    Recommendations for Other Services    Frequency  Min 2X/week    Precautions / Restrictions Precautions Precautions: Fall Restrictions Weight Bearing Restrictions: Yes LLE Weight Bearing: Weight bearing as tolerated   Pertinent Vitals/Pain 1 to 2 L hip; R sciatica is worse--repositioned and this improved    ADL  Lower Body Bathing: Minimal assistance (with AE) Where Assessed - Lower Body Bathing: Supported sit to stand Lower Body Dressing: Minimal assistance (with AE) Where Assessed - Lower Body Dressing: Supported sit to stand Equipment Used: Rolling walker ADL Comments: Pt is able to perform UB adls with set up.  He bought AE kit: reviewed with him and he practiced with reacher and sock aid.  Stood to urinate.  Pt's HR up into 120s with standing:  did not walk to bathroom.  Simulated shower ledge, but pt was unable to lift LLE high enough to clear ledge.    OT Diagnosis: Generalized weakness  OT Problem List: Decreased activity tolerance;Pain;Decreased knowledge of use of DME or AE;Cardiopulmonary status limiting activity OT Treatment Interventions: Self-care/ADL training;DME and/or AE instruction;Patient/family education   OT Goals(Current goals can be found in the care plan section) Acute  Rehab OT Goals Patient Stated Goal: get back to being independent OT Goal Formulation: With patient/family Time For Goal Achievement: 03/10/13 Potential to Achieve Goals: Good ADL Goals Pt Will Transfer to Toilet: with min guard assist;ambulating;bedside commode Pt Will Perform Tub/Shower Transfer: with min guard assist;Shower transfer (simulate)  Visit Information  Last OT Received On: 03/03/13 Assistance Needed: +2 (+1 sit to stand) History of Present Illness: Patient is a 53 yo male s/p Lt THA (direct anterior approach - no precautions).       Prior [place] expects to be discharged to:: Private residence Living Arrangements: Spouse/significant other Available Help at Discharge: Family;Available 24 hours/day Home Equipment: None Prior Function Level of Independence: Independent Communication Communication: No difficulties         Vision/Perception     Cognition  Cognition Arousal/Alertness: Awake/alert Behavior During Therapy: WFL for tasks assessed/performed Overall Cognitive Status: Within Functional Limits for tasks assessed    Extremity/Trunk Assessment Upper Extremity Assessment Upper Extremity Assessment: Overall WFL for tasks assessed     Mobility  Transfers Overall transfer level: Needs assistance Equipment used: Rolling walker (2 wheeled) Transfers: Sit to/from Stand Sit to Stand: Min assist General transfer comment: from chair; cues for LE position     Exercise    Balance     End of Session OT - End of Session Activity Tolerance: Patient tolerated treatment well Patient left: in chair;with call bell/phone within reach;with family/visitor present  [place] 03/03/2013, 2:14 PM Lesle Chris, OTR/L [phone] 03/03/2013

## 2013-03-03 NOTE — Op Note (Signed)
NAMEARGELIO, [name] [name] NO.:  [id number]  MEDICAL RECORD NO.:  [id number]  LOCATION:  [id number]                        FACILITY:  [facility]  PHYSICIAN:  Garey Alleva C. [name], M.D.    DATE OF BIRTH:  1960-09-09  DATE OF PROCEDURE:  03/02/2013 DATE OF DISCHARGE:                              OPERATIVE REPORT   PREOPERATIVE DIAGNOSIS:  Left hip osteoarthritis.  POSTOPERATIVE DIAGNOSIS:  Left hip osteoarthritis.  PROCEDURE:  Left direct anterior total hip arthroplasty.  SURGEON:  Rayshawn Maney C. [name], M.D.  ASSISTANT:  Maud Deed, PA-C, medically necessary and present for the entire procedure.  SECOND ASSISTANT:  RNFA.  ANESTHESIA:  __________ Deniece Ree BLOOD LOSS:  100 mL.  DRAINS:  None.  COMPLICATIONS:  None.  COMPONENTS:  Corail DePuy #11 stem +1.5 neck, 54 mm acetabulum, +4 offset polyethylene liner, metal ball.  DESCRIPTION OF PROCEDURE:  After induction of general anesthesia, the patient had the hana table boots applied.  He was placed on the table post careful positioning.  The left anterior hip region was clipped.  C- arm was brought in, checked position lines drawn.  He had equivalent leg lengths, measuring at the ischial tuberosity to both lesser trochanters. Hip was prepped after 10 to 15 drapes had been applied and then more split sheets, drapes, taking large shower curtain Betadine Steri-Drape, half sheet on the opposite side, half sheet proximally.  Time-out procedure was completed.  Sterile skin marker was used over the top of the Betadine Steri-Drape.  Anterior incision was made after marking out the greater trochanter ASIS.  Oblique incision was made.  It was extended due to the patient's 347-pound size.  Thick subcutaneous tissue with multiple large veins that were coagulated.  Fascia was split in line with the skin incision.  Muscle was elevated medially after grasped with an Allis clamp, and then appropriate plane was developed between the  iliopsoas sartorius.  __________ was placed medially over the medial neck outside the capsule.  Anterior capsulectomy was performed with __________ around the superior neck.  Once capsule was opened, cobras were placed intracapsular and continued removal of the capsule was performed.  Neck was cut with direct C-arm visualization with about an 8- mm neck cut about the lesser trochanter.  Neck cut was made a few mm lower than normal due to the patient's fairly large size.  Extensive time was then spent after the lateral aspect of the cut and vertical portion had been made with an osteotome.  After removal of the head, extensive resection of the labrum with 90-degree Hohmann placed anteriorly over the anterior-inferior acetabulum.  Labrum resected sequential reaming up to 53, 52 trial bottomed out, a 54 cup was inserted.  It was checked under C-arm for final positioning, was in good version and 45 degrees abduction.  Permanent liner was inserted, tested, tight.  Finishing impactor was pounded down and there was good position checked under C-arm.  Large femoral hook was applied for hydraulic lifting.  Leg was then taken down, externally rotated, and taken underneath the opposite leg.  Due to the patient's extremely large size, we had to lean against the lateral-anterior thigh in order to get approach  to the femur.  Extensive time was spent hollowing out proximally __________ the superior aspect of the posterior capsule __________ to the trochanter and peeling back a little bit of the medius over the trochanter, thinning it for approach and then sequential cookie cutter chili pepper sequential broaching up to 11 checked under fluoroscopy with trial reduction and restoration of leg length. Permanent stem was inserted, +1.5 neck, 36 ball.  Meticulously cleaning the trunnion before placing the ball, impacting it, and then the hip was reduced.  Ankle leg lengths, AP and lateral x-ray was taken.   The hip could be externally rotated 90 degrees and shucked, which was only trace shucked and was still stable.  Leg lengths were restored.  Good position of the stem, AP and lateral.  Good position of acetabulum with abduction and inversion, copious irrigation and then V-Loc closure of fascia and subcutaneous tissue, skin closure with Dermabond on the skin.  Postop dressing and transferred to the recovery room in stable condition. Instrument count and needle count was correct.     Ashtan Laton C. Ophelia CharterYates, M.D.     MCY/MEDQ  D:  03/02/2013  T:  03/03/2013  Job:  [id number]

## 2013-03-04 LAB — CBC
HEMATOCRIT: 33.8 % — AB (ref 39.0–52.0)
Hemoglobin: 11.5 g/dL — ABNORMAL LOW (ref 13.0–17.0)
MCH: 30.4 pg (ref 26.0–34.0)
MCHC: 34 g/dL (ref 30.0–36.0)
MCV: 89.4 fL (ref 78.0–100.0)
Platelets: 199 10*3/uL (ref 150–400)
RBC: 3.78 MIL/uL — ABNORMAL LOW (ref 4.22–5.81)
RDW: 12.8 % (ref 11.5–15.5)
WBC: 7.5 10*3/uL (ref 4.0–10.5)

## 2013-03-04 LAB — GLUCOSE, CAPILLARY
Glucose-Capillary: 122 mg/dL — ABNORMAL HIGH (ref 70–99)
Glucose-Capillary: 180 mg/dL — ABNORMAL HIGH (ref 70–99)
Glucose-Capillary: 132 mg/dL — ABNORMAL HIGH (ref 70–99)
Glucose-Capillary: 134 mg/dL — ABNORMAL HIGH (ref 70–99)

## 2013-03-04 NOTE — Progress Notes (Signed)
Subjective: Pt stable - having more pain today   Objective: Vital signs in last 24 hours: Temp:  [98.2 F (36.8 C)-101.8 F (38.8 C)] 98.2 F (36.8 C) (02/08 0809) Pulse Rate:  [98-111] 107 (02/08 0513) Resp:  [16-18] 18 (02/08 0513) BP: (110-147)/(55-72) 147/63 mmHg (02/08 0513) SpO2:  [90 %-96 %] 90 % (02/08 0513)  Intake/Output from previous day: 02/07 0701 - 02/08 0700 In: 1085 [P.O.:335; I.V.:750] Out: 1450 [Urine:1450] Intake/Output this shift:    Exam:  Dorsiflexion/Plantar flexion intact  Labs:  Recent Labs  03/03/13 0325 03/04/13 0445  HGB 11.8* 11.5*    Recent Labs  03/03/13 0325 03/04/13 0445  WBC 8.2 7.5  RBC 3.90* 3.78*  HCT 34.9* 33.8*  PLT 193 199    Recent Labs  03/03/13 0325  NA 140  K 3.7  CL 98  CO2 30  BUN 14  CREATININE 0.99  GLUCOSE 223*  CALCIUM 8.2*   No results found for this basename: LABPT, INR,  in the last 72 hours  Assessment/Plan: Labs stable - likely ready for dc am   Brittny Spangle SCOTT 03/04/2013, 11:09 AM

## 2013-03-04 NOTE — Progress Notes (Signed)
Occupational Therapy Treatment Patient Details Name: Sundiata Ferrick MRN: [id number] DOB: May 05, 1960 Today's Date: 03/04/2013 Time: 1010-1037 OT Time Calculation (min): 27 min  OT Assessment / Plan / Recommendation  History of present illness Patient is a 53 yo male s/p Lt THA (direct anterior approach - no precautions).   OT comments  Moving well. Still requiring assistance for bed mobility sequencing and tech.  Able to complete toileting and sim. Shower stall transfer with min guard a/s.  Has purchased a/e kit for lb adls.    Follow Up Recommendations  No OT follow up           Equipment Recommendations  3 in 1 bedside comode        Frequency Min 2X/week   Progress towards OT Goals Progress towards OT goals: Progressing toward goals  Plan Discharge plan remains appropriate    Precautions / Restrictions Precautions Precautions: Fall Restrictions LLE Weight Bearing: Weight bearing as tolerated   Pertinent Vitals/Pain 0/10, reports pain meds given prior to arrival.  States he went too long yesterday without taking.  Reviewed benefits of keeping up with reg. Dosage to avoid having spikes in pain.     ADL  Grooming: Performed;Wash/dry hands Where Assessed - Grooming: Unsupported standing Toilet Transfer: Performed;Min guard Armed forces technical officer Method: Other (comment) (standing) Toilet Transfer Equipment: Regular height toilet;Grab bars Toileting - Clothing Manipulation and Hygiene: Performed;Supervision/safety Where Assessed - Best boy and Hygiene: Standing Tub/Shower Transfer: Simulated;Min guard Tub/Shower Transfer Method: Anterior-posterior Equipment Used: Rolling walker Transfers/Ambulation Related to ADLs: intermittent cues for hand placement and sliding LLE forward prior to sitting down ADL Comments: amb. to b.room and stood for toileting with s/min guard a.  sim. tub transfer with min guard a.  reports he purchased a/e kit and declined further review  stating he feels he has a good understanding from yesterday's review     OT Goals(current goals can now be found in the care plan section)    Visit Information  Last OT Received On: 03/04/13 History of Present Illness: Patient is a 53 yo male s/p Lt THA (direct anterior approach - no precautions).                 Cognition  Cognition Arousal/Alertness: Awake/alert Behavior During Therapy: WFL for tasks assessed/performed Overall Cognitive Status: Within Functional Limits for tasks assessed    Mobility  Bed Mobility Overal bed mobility: Needs Assistance Bed Mobility: Rolling;Sidelying to Sit Rolling: Min assist;Mod assist Sidelying to sit: Min assist General bed mobility comments: initial assistance to initiate roll and inst. cues for bringing b les off of bed and using ues to push self into upright position.-completed with hob flat Transfers Overall transfer level: Needs assistance Equipment used: Rolling walker (2 wheeled) Transfers: Sit to/from Stand Sit to Stand: Min guard General transfer comment: min assist from recliner and to bed with cues for hand placement and L LE placement for comfort              End of Session OT - End of Session Activity Tolerance: Patient tolerated treatment well Patient left: in chair;with call bell/phone within reach;with family/visitor present       Janice Coffin, COTA/L 03/04/2013, 11:51 AM

## 2013-03-04 NOTE — Progress Notes (Signed)
Physical Therapy Treatment Patient Details Name: [name] CampbellDaniel Mcalpine MRN: [id number] DOB: 09-24-1960 Today's Date: 03/04/2013 Time: [id number] PT Time Calculation (min): 23 min  PT Assessment / Plan / Recommendation  History of Present Illness Patient is a 53 yo male s/p Lt THA (direct anterior approach - no precautions).   PT Comments   Pt progressing well towards physical therapy goals. Pt reports being fatigued during session, but was still able to ambulate 200' with RW and negotiate 5 steps. Pt states he is stiff from sitting in the recliner, but appeared to loosen up quickly after began gait training. Occasional cueing needed for walker placement for safety, however overall pt's ambulation technique is improving.   Follow Up Recommendations  Home health PT;Supervision/Assistance - 24 hour     Does the patient have the potential to tolerate intense rehabilitation     Barriers to Discharge        Equipment Recommendations  Rolling walker with 5" wheels;3in1 (PT)    Recommendations for Other Services    Frequency 7X/week   Progress towards PT Goals Progress towards PT goals: Progressing toward goals  Plan Current plan remains appropriate    Precautions / Restrictions Precautions Precautions: Fall Restrictions Weight Bearing Restrictions: Yes LLE Weight Bearing: Weight bearing as tolerated   Pertinent Vitals/Pain Pt reports 0/10 pain at rest while sitting in the chair; 2/10 during ambulation.    Mobility  Bed Mobility Overal bed mobility: Needs Assistance Bed Mobility: Sit to Supine Rolling: Min assist;Mod assist Sidelying to sit: Min assist Sit to supine: Min assist General bed mobility comments: Assist for LLE elevation back into bed, as well as for LLE movement and support to adjust positioning in bed.  Transfers Overall transfer level: Needs assistance Equipment used: Rolling walker (2 wheeled) Transfers: Sit to/from Stand Sit to Stand: Min guard General transfer  comment: Pt demonstrates proper hand placement and safety awareness while coming to full stand.  Ambulation/Gait Ambulation/Gait assistance: Min guard;Supervision Ambulation Distance (Feet): 200 Feet Assistive device: Rolling walker (2 wheeled) Gait Pattern/deviations: Step-to pattern;Step-through pattern;Decreased stride length;Trunk flexed Gait velocity: Decreased Gait velocity interpretation: Below normal speed for age/gender General Gait Details: VC's for increased heel strike, increased quad activation, and walker positioning closer to pt's body. Stairs: Yes Stairs assistance: Min guard Stair Management: Two rails;Forwards Number of Stairs: 5 General stair comments: Pt states he will be using the entrance where he will be able to grab onto door frame with both hands while advancing up steps. States he has been pulling himself up the stairs this way for a long time and feels he will be fine getting into the house. PT offers backward option with walker, however pt declines.    Exercises Total Joint Exercises Ankle Circles/Pumps: 15 reps Quad Sets: 15 reps Short Arc Quad: 15 reps Heel Slides: 15 reps Hip ABduction/ADduction: 15 reps Long Arc Quad: 15 reps   PT Diagnosis:    PT Problem List:   PT Treatment Interventions:     PT Goals (current goals can now be found in the care plan section) Acute Rehab PT Goals Patient Stated Goal: Get back to work PT Goal Formulation: With patient/family Time For Goal Achievement: 03/09/13 Potential to Achieve Goals: Good  Visit Information  Last PT Received On: 03/04/13 Assistance Needed: +1 History of Present Illness: Patient is a 53 yo male s/p Lt THA (direct anterior approach - no precautions).    Subjective Data  Subjective: "I feel stiff but I'm doing ok." Patient Stated Goal: Get  back to work   Copywriter, advertising Arousal/Alertness: Awake/alert Behavior During Therapy: WFL for tasks assessed/performed Overall Cognitive Status:  Within Functional Limits for tasks assessed    Balance  Balance Overall balance assessment: Needs assistance Sitting-balance support: Feet supported Sitting balance-Leahy Scale: Good Standing balance support: Bilateral upper extremity supported Standing balance-Leahy Scale: Fair  End of Session PT - End of Session Equipment Utilized During Treatment: Gait belt Activity Tolerance: Patient tolerated treatment well Patient left: in bed;with call bell/phone within reach;with family/visitor present Nurse Communication: Mobility status   GP     Ruthann Cancer 03/04/2013, 3:38 PM  Ruthann Cancer, PT, DPT [phone]

## 2013-03-04 NOTE — Progress Notes (Signed)
Physical Therapy Treatment Patient Details Name: [name] CampbellDaniel [name] MRN: [id number] DOB: 20-Jan-1961 Today's Date: 03/04/2013 Time: [id number] PT Time Calculation (min): 36 min  PT Assessment / Plan / Recommendation  History of Present Illness Patient is a 53 yo male s/p Lt THA (direct anterior approach - no precautions).   PT Comments   Pt  Progressing well towards physical therapy goals. Pt able to negotiate 5 steps with bilateral railings, however states that he has to navigate through uneven terrein to get to steps with bilateral railings. Will continue stair training to best meet his needs for entrance to home.  Follow Up Recommendations  Home health PT;Supervision/Assistance - 24 hour     Does the patient have the potential to tolerate intense rehabilitation     Barriers to Discharge        Equipment Recommendations  Rolling walker with 5" wheels;3in1 (PT)    Recommendations for Other Services    Frequency 7X/week   Progress towards PT Goals Progress towards PT goals: Progressing toward goals  Plan Current plan remains appropriate    Precautions / Restrictions Precautions Precautions: Fall Restrictions Weight Bearing Restrictions: Yes LLE Weight Bearing: Weight bearing as tolerated   Pertinent Vitals/Pain Pt reports minimal pain throughout session.     Mobility  Bed Mobility Overal bed mobility: Needs Assistance Bed Mobility: Rolling;Sidelying to Sit Rolling: Min assist;Mod assist Sidelying to sit: Min assist General bed mobility comments: NT - pt received sitting in chair.  Transfers Overall transfer level: Needs assistance Equipment used: Rolling walker (2 wheeled) Transfers: Sit to/from Stand Sit to Stand: Min assist General transfer comment: Assist to power-up to full stand from low recliner. Pt was cued for hand placement on seated surface for safety.  Ambulation/Gait Ambulation/Gait assistance: Min guard Ambulation Distance (Feet): 200 Feet Assistive device:  Rolling walker (2 wheeled) Gait Pattern/deviations: Step-through pattern;Decreased stride length;Trunk flexed Gait velocity: Slow Gait velocity interpretation: Below normal speed for age/gender General Gait Details: VC's for increased heel strike, increased quad activation, and safety awareness with the RW.  Stairs: Yes Stairs assistance: Min guard Stair Management: Two rails;Forwards;Step to pattern Number of Stairs: 5 General stair comments: VC's for sequencing and safety awareness    Exercises Total Joint Exercises Ankle Circles/Pumps: 15 reps Quad Sets: 15 reps Short Arc Quad: 15 reps Heel Slides: 15 reps Hip ABduction/ADduction: 15 reps Long Arc Quad: 15 reps   PT Diagnosis:    PT Problem List:   PT Treatment Interventions:     PT Goals (current goals can now be found in the care plan section) Acute Rehab PT Goals Patient Stated Goal: Get back to work PT Goal Formulation: With patient/family Time For Goal Achievement: 03/09/13 Potential to Achieve Goals: Good  Visit Information  Last PT Received On: 03/04/13 Assistance Needed: +1 History of Present Illness: Patient is a 53 yo male s/p Lt THA (direct anterior approach - no precautions).    Subjective Data  Subjective: Pt agreeable to participation in PT. Wife present. Patient Stated Goal: Get back to work   Cognition  Cognition Arousal/Alertness: Awake/alert Behavior During Therapy: WFL for tasks assessed/performed Overall Cognitive Status: Within Functional Limits for tasks assessed    Balance  Balance Overall balance assessment: Needs assistance Sitting-balance support: Feet supported;Bilateral upper extremity supported Sitting balance-Leahy Scale: Good Standing balance support: Bilateral upper extremity supported Standing balance-Leahy Scale: Fair  End of Session PT - End of Session Equipment Utilized During Treatment: Gait belt Activity Tolerance: Patient tolerated treatment well Patient left: in  chair;with call bell/phone within reach Nurse Communication: Mobility status   GP     Ruthann Cancer 03/04/2013, 12:07 PM  Ruthann Cancer, PT, DPT [phone]

## 2013-03-05 ENCOUNTER — Encounter (HOSPITAL_COMMUNITY): Payer: Self-pay | Admitting: Orthopaedic Surgery

## 2013-03-05 LAB — CBC
HEMATOCRIT: 31.2 % — AB (ref 39.0–52.0)
Hemoglobin: 10.9 g/dL — ABNORMAL LOW (ref 13.0–17.0)
MCH: 30.5 pg (ref 26.0–34.0)
MCHC: 34.9 g/dL (ref 30.0–36.0)
MCV: 87.4 fL (ref 78.0–100.0)
Platelets: 187 10*3/uL (ref 150–400)
RBC: 3.57 MIL/uL — ABNORMAL LOW (ref 4.22–5.81)
RDW: 12.6 % (ref 11.5–15.5)
WBC: 7.5 10*3/uL (ref 4.0–10.5)

## 2013-03-05 LAB — GLUCOSE, CAPILLARY
GLUCOSE-CAPILLARY: 158 mg/dL — AB (ref 70–99)
Glucose-Capillary: 133 mg/dL — ABNORMAL HIGH (ref 70–99)
Glucose-Capillary: 94 mg/dL (ref 70–99)

## 2013-03-05 MED ORDER — OXYCODONE-ACETAMINOPHEN 5-325 MG PO TABS: 1.0000 | ORAL_TABLET | ORAL | Status: DC | PRN

## 2013-03-05 NOTE — Progress Notes (Signed)
OT Cancellation Note  Patient Details Name: Norlene CampbellDaniel Pickering MRN: [id number] DOB: September 12, 1960   Cancelled Treatment:    Reason Eval/Treat Not Completed: Other (comment). Pt reports no further OT need, will sign off. I did follow up with his concern that he did not have his DME yet. CM, Darl PikesSusan in process of verifying this as I type.   Evette GeorgesLeonard, Kamron Vanwyhe Eva [phone] 03/05/2013, 9:38 AM

## 2013-03-05 NOTE — Progress Notes (Signed)
Physical Therapy Treatment Patient Details Name: Vidit Boissonneault MRN: [id number] DOB: 27-Dec-1960 Today's Date: 03/05/2013 Time: 0900-0919 PT Time Calculation (min): 19 min  PT Assessment / Plan / Recommendation  History of Present Illness Patient is a 53 yo male s/p Lt THA (direct anterior approach - no precautions).   PT Comments   Pt has met all goals and is mod I with gait and mobility, will benefit from HHPT for continued strengthening and gait training to progress to gait without AD and return to prior level of function.  Follow Up Recommendations  Home health PT;Supervision - Intermittent     Does the patient have the potential to tolerate intense rehabilitation     Barriers to Discharge        Equipment Recommendations  Rolling walker with 5" wheels;3in1 (PT)    Recommendations for Other Services    Frequency 7X/week   Progress towards PT Goals Progress towards PT goals: Goals met/education completed, patient discharged from PT  Plan Current plan remains appropriate    Precautions / Restrictions Precautions Precautions: Fall Restrictions Weight Bearing Restrictions: Yes LLE Weight Bearing: Weight bearing as tolerated   Pertinent Vitals/Pain Pt c/o LLE fatigue after gait, no c/o pain.    Mobility  Transfers Overall transfer level: Modified independent Ambulation/Gait Ambulation/Gait assistance: Modified independent (Device/Increase time) Ambulation Distance (Feet): 300 Feet Assistive device: Rolling walker (2 wheeled) General Gait Details: improved step through pattern Stairs: Yes Stairs assistance: Supervision Stair Management: Two rails;Forwards Number of Stairs: 5 General stair comments: pt states he will grab door frame to assist with stair climbing, pt states this is how he entered home PTA so he feels comfortable with this technique    Exercises  Reviewed HEP with pt, he verbalizes understanding   PT Diagnosis:    PT Problem List:   PT Treatment  Interventions:     PT Goals (current goals can now be found in the care plan section)    Visit Information  Last PT Received On: 03/05/13 Assistance Needed: +1 History of Present Illness: Patient is a 53 yo male s/p Lt THA (direct anterior approach - no precautions).    Subjective Data      Cognition  Cognition Arousal/Alertness: Awake/alert Behavior During Therapy: WFL for tasks assessed/performed Overall Cognitive Status: Within Functional Limits for tasks assessed    Balance     End of Session PT - End of Session Equipment Utilized During Treatment: Gait belt Activity Tolerance: Patient tolerated treatment well Patient left: in chair;with call bell/phone within reach Nurse Communication: Mobility status   GP     Aleene Swanner 03/05/2013, 9:25 AM

## 2013-03-05 NOTE — Progress Notes (Signed)
Patient provided with discharge instructions and follow up information. He has DME for home use provided by hospital. He is going home with his wife at this time.

## 2013-03-05 NOTE — Progress Notes (Signed)
Subjective: 3 Days Post-Op Procedure(s) (LRB): TOTAL HIP ARTHROPLASTY ANTERIOR APPROACH (Left) Patient reports pain as mild.    Objective: Vital signs in last 24 hours: Temp:  [98.2 F (36.8 C)-99.4 F (37.4 C)] 98.9 F (37.2 C) (02/09 0611) Pulse Rate:  [91-96] 91 (02/09 0611) Resp:  [17-18] 18 (02/09 0611) BP: (118-142)/(57-82) 142/82 mmHg (02/09 0611) SpO2:  [90 %-95 %] 94 % (02/09 0611) Weight:  [157.852 kg (348 lb)] 157.852 kg (348 lb) (02/09 0401)  Intake/Output from previous day: 02/08 0701 - 02/09 0700 In: 400 [P.O.:400] Out: -  Intake/Output this shift: Total I/O In: 360 [P.O.:360] Out: -    Recent Labs  03/03/13 0325 03/04/13 0445 03/05/13 0530  HGB 11.8* 11.5* 10.9*    Recent Labs  03/04/13 0445 03/05/13 0530  WBC 7.5 7.5  RBC 3.78* 3.57*  HCT 33.8* 31.2*  PLT 199 187    Recent Labs  03/03/13 0325  NA 140  K 3.7  CL 98  CO2 30  BUN 14  CREATININE 0.99  GLUCOSE 223*  CALCIUM 8.2*   No results found for this basename: LABPT, INR,  in the last 72 hours  Neurovascular intact Dorsiflexion/Plantar flexion intact Incision: dressing C/D/I  Assessment/Plan: 3 Days Post-Op Procedure(s) (LRB): TOTAL HIP ARTHROPLASTY ANTERIOR APPROACH (Left) Discharge home today  [name] [name], [name] [name] 03/05/2013, 11:58 AM

## 2013-03-05 NOTE — Care Management Note (Signed)
CARE MANAGEMENT NOTE 03/05/2013  Patient:  [name] [name],[name] [name]   Account Number:  [id number]  Date Initiated:  03/05/2013  Documentation initiated by:  Vance PeperBRADY,Dashawn Golda  Subjective/Objective Assessment:   53 yr old male s/p left total hip arthroplasty.     Action/Plan:   Case manager spoke with patient concerning DME and HH needs. patient states he will not need HHPT per Dr.DME ordered.   Anticipated DC Date:  03/05/2013   Anticipated DC Plan:  HOME/SELF CARE      DC Planning Services  CM consult      PAC Choice  DURABLE MEDICAL EQUIPMENT   Choice offered to / List presented to:     DME arranged  WALKER - TALL  WALKER - WIDE  3-N-1      DME agency  [facility]        Status of service:  Completed, signed off Medicare Important Message given?   (If response is "NO", the following Medicare IM given date fields will be blank) Date Medicare IM given:   Date Additional Medicare IM given:    Discharge Disposition:  HOME/SELF CARE

## 2013-03-06 NOTE — Discharge Summary (Signed)
Patient ID: [name] [name] MRN: [id number] DOB/AGE: [birth year] 53 y.o.  Admit date: 03/02/2013 Discharge date: 03/06/2013  Admission Diagnoses:  Active Problems:   Osteoarthritis of left hip   Discharge Diagnoses:  S/p left total hip arthroplasty direct anterior approach  Past Medical History  Diagnosis Date  . Hypertension   . Diabetes mellitus without complication   . OA (osteoarthritis) of hip     Hx: of  . Heart murmur     Hx: of as achild  . Sleep apnea   . Kidney stones     Hx: of  . Inguinal hernia     Hx: of left groin    Surgeries: Procedure(s): TOTAL HIP ARTHROPLASTY ANTERIOR APPROACH on 03/02/2013   Consultants:  PT  Discharged Condition: Improved  Hospital Course: [name] [name] is an 53 y.o. male who was admitted 03/02/2013 for operative treatment of<principal problem not specified>. Patient has severe unremitting pain that affects sleep, daily activities, and work/hobbies. After pre-op clearance the patient was taken to the operating room on 03/02/2013 and underwent  Procedure(s): TOTAL HIP ARTHROPLASTY ANTERIOR APPROACH.    Patient was given perioperative antibiotics: Anti-infectives   Start     Dose/Rate Route Frequency Ordered Stop   03/01/13 1516  vancomycin (VANCOCIN) 1,500 mg in sodium chloride 0.9 % 500 mL IVPB     1,500 mg 250 mL/hr over 120 Minutes Intravenous 60 min pre-op 03/01/13 1516 03/02/13 0845       Patient was given sequential compression devices, early ambulation, and chemoprophylaxis to prevent DVT.  Patient benefited maximally from hospital stay and there were no complications.    Recent vital signs: No data found.    Recent laboratory studies:  Recent Labs  03/04/13 0445 03/05/13 0530  WBC 7.5 7.5  HGB 11.5* 10.9*  HCT 33.8* 31.2*  PLT 199 187     Discharge Medications:     Medication List         allopurinol 100 MG tablet  Commonly known as:  ZYLOPRIM  Take 100 mg by mouth daily.     aspirin EC 325 MG tablet  Take  1 tablet (325 mg total) by mouth daily.     atorvastatin 10 MG tablet  Commonly known as:  LIPITOR  Take 10 mg by mouth daily.     carvedilol 6.25 MG tablet  Commonly known as:  COREG  Take 6.25 mg by mouth 2 (two) times daily with a meal.     glimepiride 4 MG tablet  Commonly known as:  AMARYL  Take 4 mg by mouth daily with breakfast.     hydrochlorothiazide 25 MG tablet  Commonly known as:  HYDRODIURIL  Take 25 mg by mouth daily.     insulin glargine 100 UNIT/ML injection  Commonly known as:  LANTUS  Inject 25-75 Units into the skin 2 (two) times daily. 25units in the morning and 75 units in the evening     losartan 100 MG tablet  Commonly known as:  COZAAR  Take 100 mg by mouth daily.     metFORMIN 1000 MG tablet  Commonly known as:  GLUCOPHAGE  Take 2,000 mg by mouth daily.     methocarbamol 500 MG tablet  Commonly known as:  ROBAXIN  Take 1 tablet (500 mg total) by mouth every 6 (six) hours as needed for muscle spasms (spasm).     naproxen sodium 220 MG tablet  Commonly known as:  ANAPROX  Take 440 mg by mouth 2 (two) times daily with  a meal.     oxyCODONE-acetaminophen 5-325 MG per tablet  Commonly known as:  ROXICET  Take 1-2 tablets by mouth every 4 (four) hours as needed.        Diagnostic Studies: Dg Chest 2 View  02/21/2013   CLINICAL DATA:  Preoperative evaluation for left hip replacement, former smoker, hypertension, diabetes, sleep apnea  EXAM: CHEST  2 VIEW  COMPARISON:  None  FINDINGS: Normal heart size, mediastinal contours and pulmonary vascularity.  Minimal peribronchial thickening.  Lungs grossly clear.  No pleural effusion or pneumothorax.  Degenerative disc disease changes thoracic and lumbar spine.  IMPRESSION: Minimal bronchitic changes.   Electronically Signed   By: Ulyses SouthwardMark  Boles M.D.   On: 02/21/2013 17:14   Dg Hip Operative Left  03/02/2013   CLINICAL DATA:  Left total hip arthroplasty  EXAM: OPERATIVE LEFT HIP  COMPARISON:  Preoperative MRI  02/05/2013  FINDINGS: Single frontal intraoperative spot radiograph of the left hip demonstrate interval surgical changes of total hip arthroplasty. No evidence of acute hardware complication on this image.  IMPRESSION: Surgical changes of left total hip arthroplasty as above.   Electronically Signed   By: Malachy MoanHeath  McCullough M.D.   On: 03/02/2013 11:22   Mr Hip Left Wo Contrast  02/06/2013   CLINICAL DATA:  Left hip pain.  EXAM: MRI OF THE LEFT HIP WITHOUT CONTRAST  TECHNIQUE: Multiplanar, multisequence MR imaging was performed. No intravenous contrast was administered.  COMPARISON:  None.  FINDINGS: Both hips are normally located. There are moderate hip joint degenerative changes bilaterally, left slightly greater than right. There is joint space narrowing, degenerative chondrosis, osteophytic spurring and subchondral cystic change. No findings for avascular necrosis or stress fracture. There is a small left-sided joint effusion. No periarticular fluid collections to suggest a paralabral cyst.  The pubic symphysis and SI joints are intact. No pelvic stress fracture.  The surrounding hip and pelvic musculature appear normal. No findings for gluteus medius tendinitis or trochanteric bursitis. The hamstring attachments appear normal.  No significant intrapelvic abnormalities are identified. Prominent fat containing inguinal rings versus inguinal hernias containing fat, left greater than right.  IMPRESSION: 1. Age advanced hip joint degenerative changes, left greater than right. 2. No findings for stress fracture or avascular necrosis. 3. Intact hip and pelvic musculature. 4. Bilateral inguinal hernias versus prominent fat containing inguinal canals, left greater than right.   Electronically Signed   By: Loralie ChampagneMark  Gallerani M.D.   On: 02/06/2013 09:30    Disposition: 01-Home or Self Care      Discharge Orders   Future Orders Complete By Expires   Full weight bearing  As directed    Questions:     Laterality:   left   Extremity:  Lower      Follow-up Information   Follow up with YATES,MARK C, MD. Schedule an appointment as soon as possible for a visit in 2 weeks.   Specialty:  Orthopedic Surgery   Contact information:   [address] [place] [address] [phone]        Signed: Richardean CanalCLARK, Emara Lichter 03/06/2013, 11:51 AM

## 2014-04-11 ENCOUNTER — Other Ambulatory Visit: Payer: Self-pay | Admitting: Orthopaedic Surgery

## 2014-04-11 DIAGNOSIS — M79672 Pain in left foot: Secondary | ICD-10-CM

## 2014-04-16 ENCOUNTER — Ambulatory Visit
Admission: RE | Admit: 2014-04-16 | Discharge: 2014-04-16 | Disposition: A | Discharge status: Home / Self Care | Payer: BLUE CROSS/BLUE SHIELD | Source: Ambulatory Visit | Attending: Orthopaedic Surgery | Admitting: Orthopaedic Surgery

## 2014-04-16 DIAGNOSIS — M79672 Pain in left foot: Secondary | ICD-10-CM

## 2014-05-20 ENCOUNTER — Other Ambulatory Visit (HOSPITAL_COMMUNITY): Payer: Self-pay | Admitting: Orthopaedic Surgery

## 2014-05-21 NOTE — Pre-Procedure Instructions (Signed)
[name] [name]  05/21/2014   Your procedure is scheduled on: Wednesday, May 29, 2014  [facility] at 10:30 AM.  Call this number if you have problems the morning of surgery: [phone]   Remember:   Do not eat food or drink liquids after midnight Tuesday, May 28, 2014   Take these medicines the morning of surgery with A SIP OF WATER: carvedilol (COREG)  DO NOT take any diabetic medications the morning of procedure such as glimepiride (AMARYL), metFORMIN (GLUCOPHAGE) and insulin glargine (LANTUS).  Stop taking Aspirin, vitamins and herbal medications. Do not take any NSAIDs ie: Ibuprofen, Advil, Naproxen or any medication containing Aspirin; stop 5 days prior to procedure (Friday, May 24, 2014)   Do not wear jewelry, make-up or nail polish.  Do not wear lotions or colognes. You may not wear deodorant.   Men may shave face and neck.  Do not bring valuables to the hospital.  [facility] is not responsible for any belongings or valuables.               Contacts, dentures or bridgework may not be worn into surgery.  Leave suitcase in the car. After surgery it may be brought to your room.  For patients admitted to the hospital, discharge time is determined by your treatment team.               Patients discharged the day of surgery will not be allowed to drive home.  Name and phone number of your driver: [name] [name] -  SPOUSE    Special Instructions:  Special Instructions:Special Instructions: [facility] - Preparing for [facility]  Before surgery, you can play an important role.  Because skin is not sterile, your skin needs to be as free of germs as possible.  You can reduce the number of germs on you skin by washing with CHG (chlorahexidine gluconate) soap before surgery.  CHG is an antiseptic cleaner which kills germs and bonds with the skin to continue killing germs even after washing.  Please DO NOT use if you have an allergy to CHG or antibacterial  soaps.  If your skin becomes reddened/irritated stop using the CHG and inform your nurse when you arrive at Short Stay.  Do not shave (including legs and underarms) for at least 48 hours prior to the first CHG shower.  You may shave your face.  Please follow these instructions carefully:   1.  Shower with CHG Soap the night before surgery and the morning of Surgery.  2.  If you choose to wash your hair, wash your hair first as usual with your normal shampoo.  3.  After you shampoo, rinse your hair and body thoroughly to remove the Shampoo.  4.  Use CHG as you would any other liquid soap.  You can apply chg directly  to the skin and wash gently with scrungie or a clean washcloth.  5.  Apply the CHG Soap to your body ONLY FROM THE NECK DOWN.  Do not use on open wounds or open sores.  Avoid contact with your eyes, ears, mouth and genitals (private parts).  Wash genitals (private parts) with your normal soap.  6.  Wash thoroughly, paying special attention to the area where your surgery will be performed.  7.  Thoroughly rinse your body with warm water from the neck down.  8.  DO NOT shower/wash with your normal soap after using and rinsing off the CHG Soap.  9.  Pat yourself dry with a clean towel.            10.  Wear clean pajamas.            11.  Place clean sheets on your bed the night of your first shower and do not sleep with pets.  Day of Surgery  Do not apply any lotions/deodorants the morning of surgery.  Please wear clean clothes to the hospital/surgery center.   Please read over the following fact sheets that you were given: Pain Booklet, Coughing and Deep Breathing and Surgical Site Infection Prevention

## 2014-05-22 ENCOUNTER — Encounter (HOSPITAL_COMMUNITY)
Admission: RE | Admit: 2014-05-22 | Discharge: 2014-05-22 | Disposition: A | Discharge status: Home / Self Care | Payer: BLUE CROSS/BLUE SHIELD | Source: Ambulatory Visit | Attending: Orthopaedic Surgery | Admitting: Orthopaedic Surgery

## 2014-05-22 ENCOUNTER — Encounter (HOSPITAL_COMMUNITY): Payer: Self-pay

## 2014-05-22 ENCOUNTER — Ambulatory Visit (HOSPITAL_COMMUNITY)
Admission: RE | Admit: 2014-05-22 | Discharge: 2014-05-22 | Disposition: A | Discharge status: Home / Self Care | Payer: BLUE CROSS/BLUE SHIELD | Source: Ambulatory Visit | Attending: Orthopaedic Surgery | Admitting: Orthopaedic Surgery

## 2014-05-22 DIAGNOSIS — Z01810 Encounter for preprocedural cardiovascular examination: Secondary | ICD-10-CM | POA: Diagnosis not present

## 2014-05-22 DIAGNOSIS — M21969 Unspecified acquired deformity of unspecified lower leg: Secondary | ICD-10-CM | POA: Insufficient documentation

## 2014-05-22 DIAGNOSIS — Z01818 Encounter for other preprocedural examination: Secondary | ICD-10-CM | POA: Insufficient documentation

## 2014-05-22 DIAGNOSIS — M216X9 Other acquired deformities of unspecified foot: Secondary | ICD-10-CM

## 2014-05-22 DIAGNOSIS — Z01812 Encounter for preprocedural laboratory examination: Secondary | ICD-10-CM | POA: Insufficient documentation

## 2014-05-22 DIAGNOSIS — R011 Cardiac murmur, unspecified: Secondary | ICD-10-CM | POA: Insufficient documentation

## 2014-05-22 DIAGNOSIS — I10 Essential (primary) hypertension: Secondary | ICD-10-CM | POA: Diagnosis not present

## 2014-05-22 DIAGNOSIS — E119 Type 2 diabetes mellitus without complications: Secondary | ICD-10-CM | POA: Diagnosis not present

## 2014-05-22 LAB — CBC
HCT: 42.5 % (ref 39.0–52.0)
Hemoglobin: 14.3 g/dL (ref 13.0–17.0)
MCH: 30.2 pg (ref 26.0–34.0)
MCHC: 33.6 g/dL (ref 30.0–36.0)
MCV: 89.7 fL (ref 78.0–100.0)
PLATELETS: 231 10*3/uL (ref 150–400)
RBC: 4.74 MIL/uL (ref 4.22–5.81)
RDW: 13 % (ref 11.5–15.5)
WBC: 8.9 10*3/uL (ref 4.0–10.5)

## 2014-05-22 LAB — COMPREHENSIVE METABOLIC PANEL
ALBUMIN: 3.9 g/dL (ref 3.5–5.2)
ALT: 33 U/L (ref 0–53)
ANION GAP: 11 (ref 5–15)
AST: 28 U/L (ref 0–37)
Alkaline Phosphatase: 67 U/L (ref 39–117)
BILIRUBIN TOTAL: 0.7 mg/dL (ref 0.3–1.2)
BUN: 19 mg/dL (ref 6–23)
CHLORIDE: 98 mmol/L (ref 96–112)
CO2: 28 mmol/L (ref 19–32)
CREATININE: 0.99 mg/dL (ref 0.50–1.35)
Calcium: 9.3 mg/dL (ref 8.4–10.5)
GFR calc non Af Amer: >90 mL/min (ref >90)
Glucose, Bld: 150 mg/dL — ABNORMAL HIGH (ref 70–99)
POTASSIUM: 3.9 mmol/L (ref 3.5–5.1)
SODIUM: 137 mmol/L (ref 135–145)
Total Protein: 6.6 g/dL (ref 6.0–8.3)

## 2014-05-22 LAB — URINALYSIS, ROUTINE W REFLEX MICROSCOPIC
BILIRUBIN URINE: NEGATIVE
GLUCOSE, UA: NEGATIVE mg/dL
KETONES UR: NEGATIVE mg/dL
Leukocytes, UA: NEGATIVE
Nitrite: NEGATIVE
PROTEIN: NEGATIVE mg/dL
Specific Gravity, Urine: 1.021 (ref 1.005–1.030)
Urobilinogen, UA: 0.2 mg/dL (ref 0.0–1.0)
pH: 5 (ref 5.0–8.0)

## 2014-05-22 LAB — URINE MICROSCOPIC-ADD ON

## 2014-05-22 LAB — SURGICAL PCR SCREEN
MRSA, PCR: NEGATIVE
Staphylococcus aureus: NEGATIVE

## 2014-05-22 LAB — PROTIME-INR
INR: 0.97 (ref 0.00–1.49)
PROTHROMBIN TIME: 13 s (ref 11.6–15.2)

## 2014-05-22 NOTE — Progress Notes (Addendum)
Had murmur as child, no problems.  Has never seen cardio.  2013 EKG done @ [facility]. Has been test for OSA greater then [facility] yrs [facility], but doesn't wear the mask.  PCP is aware.

## 2014-05-28 MED ORDER — CLINDAMYCIN PHOSPHATE 900 MG/50ML IV SOLN
900.0000 mg | INTRAVENOUS | Status: AC
  Administered 2014-05-29: 900 mg via INTRAVENOUS
  Filled 2014-05-28: qty 50

## 2014-05-29 ENCOUNTER — Inpatient Hospital Stay (HOSPITAL_COMMUNITY): Payer: BLUE CROSS/BLUE SHIELD | Admitting: Certified Registered Nurse Anesthetist

## 2014-05-29 ENCOUNTER — Observation Stay (HOSPITAL_COMMUNITY): Payer: BLUE CROSS/BLUE SHIELD

## 2014-05-29 ENCOUNTER — Encounter (HOSPITAL_COMMUNITY): Payer: Self-pay

## 2014-05-29 ENCOUNTER — Encounter (HOSPITAL_COMMUNITY)
Admission: RE | Disposition: A | Discharge status: Home / Self Care | Payer: Self-pay | Source: Ambulatory Visit | Attending: Orthopaedic Surgery

## 2014-05-29 ENCOUNTER — Observation Stay (HOSPITAL_COMMUNITY)
Admission: RE | Admit: 2014-05-29 | Discharge: 2014-06-01 | Disposition: A | Discharge status: Home / Self Care | Payer: BLUE CROSS/BLUE SHIELD | Source: Ambulatory Visit | Attending: Orthopaedic Surgery | Admitting: Orthopaedic Surgery

## 2014-05-29 DIAGNOSIS — R5082 Postprocedural fever: Secondary | ICD-10-CM

## 2014-05-29 DIAGNOSIS — Z87442 Personal history of urinary calculi: Secondary | ICD-10-CM | POA: Insufficient documentation

## 2014-05-29 DIAGNOSIS — Z981 Arthrodesis status: Secondary | ICD-10-CM

## 2014-05-29 DIAGNOSIS — Z794 Long term (current) use of insulin: Secondary | ICD-10-CM | POA: Diagnosis not present

## 2014-05-29 DIAGNOSIS — Q666 Other congenital valgus deformities of feet: Secondary | ICD-10-CM | POA: Insufficient documentation

## 2014-05-29 DIAGNOSIS — Z419 Encounter for procedure for purposes other than remedying health state, unspecified: Secondary | ICD-10-CM

## 2014-05-29 DIAGNOSIS — G4730 Sleep apnea, unspecified: Secondary | ICD-10-CM | POA: Diagnosis not present

## 2014-05-29 DIAGNOSIS — E119 Type 2 diabetes mellitus without complications: Secondary | ICD-10-CM | POA: Insufficient documentation

## 2014-05-29 DIAGNOSIS — R0902 Hypoxemia: Secondary | ICD-10-CM

## 2014-05-29 DIAGNOSIS — M19072 Primary osteoarthritis, left ankle and foot: Secondary | ICD-10-CM | POA: Diagnosis not present

## 2014-05-29 DIAGNOSIS — J9601 Acute respiratory failure with hypoxia: Secondary | ICD-10-CM

## 2014-05-29 DIAGNOSIS — Z96649 Presence of unspecified artificial hip joint: Secondary | ICD-10-CM | POA: Diagnosis not present

## 2014-05-29 DIAGNOSIS — Z91013 Allergy to seafood: Secondary | ICD-10-CM | POA: Insufficient documentation

## 2014-05-29 DIAGNOSIS — I10 Essential (primary) hypertension: Secondary | ICD-10-CM | POA: Insufficient documentation

## 2014-05-29 DIAGNOSIS — Z880 Allergy status to penicillin: Secondary | ICD-10-CM | POA: Insufficient documentation

## 2014-05-29 DIAGNOSIS — Z87891 Personal history of nicotine dependence: Secondary | ICD-10-CM | POA: Insufficient documentation

## 2014-05-29 DIAGNOSIS — Z7982 Long term (current) use of aspirin: Secondary | ICD-10-CM | POA: Insufficient documentation

## 2014-05-29 HISTORY — PX: FOOT ARTHRODESIS, TRIPLE: SUR54

## 2014-05-29 HISTORY — PX: FOOT ARTHRODESIS: SHX1655

## 2014-05-29 LAB — GLUCOSE, CAPILLARY
GLUCOSE-CAPILLARY: 129 mg/dL — AB (ref 70–99)
GLUCOSE-CAPILLARY: 201 mg/dL — AB (ref 70–99)
Glucose-Capillary: 189 mg/dL — ABNORMAL HIGH (ref 70–99)
Glucose-Capillary: 108 mg/dL — ABNORMAL HIGH (ref 70–99)

## 2014-05-29 SURGERY — FUSION, JOINT, FOOT
Anesthesia: General | Laterality: Left

## 2014-05-29 MED ORDER — LIDOCAINE HCL (CARDIAC) 20 MG/ML IV SOLN
INTRAVENOUS | Status: AC
  Filled 2014-05-29: qty 5

## 2014-05-29 MED ORDER — ALLOPURINOL 300 MG PO TABS
300.0000 mg | ORAL_TABLET | Freq: Every day | ORAL | Status: DC
  Administered 2014-05-30 – 2014-06-01 (×3): 300 mg via ORAL
  Filled 2014-05-29 (×2): qty 1

## 2014-05-29 MED ORDER — LIDOCAINE HCL (CARDIAC) 20 MG/ML IV SOLN
INTRAVENOUS | Status: AC
  Filled 2014-05-29: qty 10

## 2014-05-29 MED ORDER — POTASSIUM CHLORIDE IN NACL 20-0.45 MEQ/L-% IV SOLN
INTRAVENOUS | Status: DC
  Administered 2014-05-29: 85 mL/h via INTRAVENOUS
  Filled 2014-05-29 (×2): qty 1000

## 2014-05-29 MED ORDER — LACTATED RINGERS IV SOLN
INTRAVENOUS | Status: DC
  Administered 2014-05-29 (×2): via INTRAVENOUS

## 2014-05-29 MED ORDER — ACETAMINOPHEN 650 MG RE SUPP: 650.0000 mg | Freq: Four times a day (QID) | RECTAL | Status: DC | PRN

## 2014-05-29 MED ORDER — HYDROMORPHONE HCL 1 MG/ML IJ SOLN
0.2500 mg | INTRAMUSCULAR | Status: DC | PRN
  Administered 2014-05-29 (×4): 0.5 mg via INTRAVENOUS

## 2014-05-29 MED ORDER — DOCUSATE SODIUM 100 MG PO CAPS
100.0000 mg | ORAL_CAPSULE | Freq: Two times a day (BID) | ORAL | Status: DC
  Administered 2014-05-29 – 2014-06-01 (×6): 100 mg via ORAL
  Filled 2014-05-29 (×6): qty 1

## 2014-05-29 MED ORDER — NEOSTIGMINE METHYLSULFATE 10 MG/10ML IV SOLN
INTRAVENOUS | Status: AC
  Filled 2014-05-29: qty 1

## 2014-05-29 MED ORDER — MIDAZOLAM HCL 2 MG/2ML IJ SOLN
INTRAMUSCULAR | Status: AC
  Filled 2014-05-29: qty 2

## 2014-05-29 MED ORDER — FENTANYL CITRATE (PF) 250 MCG/5ML IJ SOLN
INTRAMUSCULAR | Status: AC
  Filled 2014-05-29: qty 5

## 2014-05-29 MED ORDER — NEOSTIGMINE METHYLSULFATE 10 MG/10ML IV SOLN
INTRAVENOUS | Status: DC | PRN
  Administered 2014-05-29: 2 mg via INTRAVENOUS
  Administered 2014-05-29: 1 mg via INTRAVENOUS

## 2014-05-29 MED ORDER — DEXTROSE 5 % IV SOLN
10.0000 mg | INTRAVENOUS | Status: DC | PRN
  Administered 2014-05-29: 20 ug/min via INTRAVENOUS

## 2014-05-29 MED ORDER — LOSARTAN POTASSIUM 50 MG PO TABS
100.0000 mg | ORAL_TABLET | Freq: Every day | ORAL | Status: DC
Start: 2014-05-29 — End: 2014-06-01
  Administered 2014-05-29 – 2014-06-01 (×4): 100 mg via ORAL
  Filled 2014-05-29 (×4): qty 2

## 2014-05-29 MED ORDER — INSULIN GLARGINE 100 UNIT/ML ~~LOC~~ SOLN
65.0000 [IU] | Freq: Every day | SUBCUTANEOUS | Status: DC
  Administered 2014-05-29 – 2014-05-31 (×3): 65 [IU] via SUBCUTANEOUS
  Filled 2014-05-29 (×4): qty 0.65

## 2014-05-29 MED ORDER — INSULIN GLARGINE 100 UNIT/ML ~~LOC~~ SOLN
25.0000 [IU] | Freq: Every day | SUBCUTANEOUS | Status: DC
  Administered 2014-05-30 – 2014-06-01 (×3): 25 [IU] via SUBCUTANEOUS
  Filled 2014-05-29 (×5): qty 0.25

## 2014-05-29 MED ORDER — ONDANSETRON HCL 4 MG/2ML IJ SOLN
INTRAMUSCULAR | Status: AC
  Filled 2014-05-29: qty 4

## 2014-05-29 MED ORDER — ROCURONIUM BROMIDE 100 MG/10ML IV SOLN
INTRAVENOUS | Status: DC | PRN
  Administered 2014-05-29: 30 mg via INTRAVENOUS

## 2014-05-29 MED ORDER — LIDOCAINE HCL (CARDIAC) 20 MG/ML IV SOLN
INTRAVENOUS | Status: DC | PRN
  Administered 2014-05-29: 100 mg via INTRAVENOUS

## 2014-05-29 MED ORDER — GLIMEPIRIDE 4 MG PO TABS
4.0000 mg | ORAL_TABLET | Freq: Two times a day (BID) | ORAL | Status: DC
  Administered 2014-05-30 – 2014-05-31 (×3): 4 mg via ORAL
  Filled 2014-05-29 (×3): qty 1

## 2014-05-29 MED ORDER — METFORMIN HCL 500 MG PO TABS
1000.0000 mg | ORAL_TABLET | Freq: Two times a day (BID) | ORAL | Status: DC
  Administered 2014-05-30 – 2014-05-31 (×3): 1000 mg via ORAL
  Filled 2014-05-29 (×3): qty 2

## 2014-05-29 MED ORDER — ALBUTEROL SULFATE HFA 108 (90 BASE) MCG/ACT IN AERS
INHALATION_SPRAY | RESPIRATORY_TRACT | Status: DC | PRN
  Administered 2014-05-29: 8 via RESPIRATORY_TRACT

## 2014-05-29 MED ORDER — METOCLOPRAMIDE HCL 5 MG PO TABS: 5.0000 mg | ORAL_TABLET | Freq: Three times a day (TID) | ORAL | Status: DC | PRN

## 2014-05-29 MED ORDER — ATORVASTATIN CALCIUM 10 MG PO TABS
10.0000 mg | ORAL_TABLET | Freq: Every day | ORAL | Status: DC
Start: 2014-05-29 — End: 2014-06-01
  Administered 2014-05-29 – 2014-06-01 (×4): 10 mg via ORAL
  Filled 2014-05-29 (×4): qty 1

## 2014-05-29 MED ORDER — ONDANSETRON HCL 4 MG PO TABS: 4.0000 mg | ORAL_TABLET | Freq: Four times a day (QID) | ORAL | Status: DC | PRN

## 2014-05-29 MED ORDER — SUCCINYLCHOLINE CHLORIDE 20 MG/ML IJ SOLN
INTRAMUSCULAR | Status: AC
  Filled 2014-05-29: qty 1

## 2014-05-29 MED ORDER — ALBUTEROL SULFATE HFA 108 (90 BASE) MCG/ACT IN AERS
INHALATION_SPRAY | RESPIRATORY_TRACT | Status: AC
  Filled 2014-05-29: qty 6.7

## 2014-05-29 MED ORDER — PHENYLEPHRINE 40 MCG/ML (10ML) SYRINGE FOR IV PUSH (FOR BLOOD PRESSURE SUPPORT)
PREFILLED_SYRINGE | INTRAVENOUS | Status: AC
  Filled 2014-05-29: qty 10

## 2014-05-29 MED ORDER — PROPOFOL 10 MG/ML IV BOLUS
INTRAVENOUS | Status: DC | PRN
  Administered 2014-05-29: 20 mg via INTRAVENOUS
  Administered 2014-05-29: 260 mg via INTRAVENOUS

## 2014-05-29 MED ORDER — PHENYLEPHRINE HCL 10 MG/ML IJ SOLN
INTRAMUSCULAR | Status: DC | PRN
  Administered 2014-05-29: 40 ug via INTRAVENOUS
  Administered 2014-05-29 (×2): 80 ug via INTRAVENOUS
  Administered 2014-05-29 (×2): 40 ug via INTRAVENOUS

## 2014-05-29 MED ORDER — HYDROCHLOROTHIAZIDE 25 MG PO TABS
25.0000 mg | ORAL_TABLET | Freq: Every day | ORAL | Status: DC
  Administered 2014-05-29 – 2014-06-01 (×4): 25 mg via ORAL
  Filled 2014-05-29 (×4): qty 1

## 2014-05-29 MED ORDER — METHOCARBAMOL 500 MG PO TABS
500.0000 mg | ORAL_TABLET | Freq: Four times a day (QID) | ORAL | Status: DC | PRN
  Administered 2014-05-30 – 2014-05-31 (×2): 500 mg via ORAL
  Filled 2014-05-29 (×3): qty 1

## 2014-05-29 MED ORDER — GLYCOPYRROLATE 0.2 MG/ML IJ SOLN
INTRAMUSCULAR | Status: DC | PRN
  Administered 2014-05-29 (×2): .2 mg via INTRAVENOUS

## 2014-05-29 MED ORDER — METOCLOPRAMIDE HCL 5 MG/ML IJ SOLN: 5.0000 mg | Freq: Three times a day (TID) | INTRAMUSCULAR | Status: DC | PRN

## 2014-05-29 MED ORDER — 0.9 % SODIUM CHLORIDE (POUR BTL) OPTIME
TOPICAL | Status: DC | PRN
  Administered 2014-05-29: 1000 mL

## 2014-05-29 MED ORDER — PROPOFOL 10 MG/ML IV BOLUS
INTRAVENOUS | Status: AC
  Filled 2014-05-29: qty 20

## 2014-05-29 MED ORDER — ONDANSETRON HCL 4 MG/2ML IJ SOLN
INTRAMUSCULAR | Status: DC | PRN
  Administered 2014-05-29: 4 mg via INTRAVENOUS

## 2014-05-29 MED ORDER — HYDROMORPHONE HCL 1 MG/ML IJ SOLN
INTRAMUSCULAR | Status: AC
  Administered 2014-05-29: 0.5 mg via INTRAVENOUS
  Filled 2014-05-29: qty 1

## 2014-05-29 MED ORDER — SUCCINYLCHOLINE CHLORIDE 20 MG/ML IJ SOLN
INTRAMUSCULAR | Status: DC | PRN
  Administered 2014-05-29: 120 mg via INTRAVENOUS

## 2014-05-29 MED ORDER — PHENYLEPHRINE HCL 10 MG/ML IJ SOLN
INTRAMUSCULAR | Status: AC
  Filled 2014-05-29: qty 3

## 2014-05-29 MED ORDER — CARVEDILOL 6.25 MG PO TABS
6.2500 mg | ORAL_TABLET | Freq: Two times a day (BID) | ORAL | Status: DC
  Administered 2014-05-30 – 2014-06-01 (×5): 6.25 mg via ORAL
  Filled 2014-05-29 (×5): qty 1

## 2014-05-29 MED ORDER — GLYCOPYRROLATE 0.2 MG/ML IJ SOLN
INTRAMUSCULAR | Status: AC
  Filled 2014-05-29: qty 3

## 2014-05-29 MED ORDER — FENTANYL CITRATE (PF) 250 MCG/5ML IJ SOLN
INTRAMUSCULAR | Status: DC | PRN
  Administered 2014-05-29 (×2): 25 ug via INTRAVENOUS
  Administered 2014-05-29: 100 ug via INTRAVENOUS

## 2014-05-29 MED ORDER — ASPIRIN EC 325 MG PO TBEC
325.0000 mg | DELAYED_RELEASE_TABLET | Freq: Every day | ORAL | Status: DC
Start: 2014-05-29 — End: 2014-06-01
  Administered 2014-05-29 – 2014-06-01 (×4): 325 mg via ORAL
  Filled 2014-05-29 (×4): qty 1

## 2014-05-29 MED ORDER — ROCURONIUM BROMIDE 50 MG/5ML IV SOLN
INTRAVENOUS | Status: AC
  Filled 2014-05-29: qty 1

## 2014-05-29 MED ORDER — LIDOCAINE HCL 4 % MT SOLN
OROMUCOSAL | Status: DC | PRN
  Administered 2014-05-29: 4 mL via TOPICAL

## 2014-05-29 MED ORDER — ONDANSETRON HCL 4 MG/2ML IJ SOLN: 4.0000 mg | Freq: Four times a day (QID) | INTRAMUSCULAR | Status: DC | PRN

## 2014-05-29 MED ORDER — ACETAMINOPHEN 325 MG PO TABS
650.0000 mg | ORAL_TABLET | Freq: Four times a day (QID) | ORAL | Status: DC | PRN
  Administered 2014-05-30 – 2014-05-31 (×2): 650 mg via ORAL
  Filled 2014-05-29 (×2): qty 2

## 2014-05-29 MED ORDER — OXYCODONE HCL 5 MG PO TABS
5.0000 mg | ORAL_TABLET | ORAL | Status: DC | PRN
  Administered 2014-05-29 – 2014-06-01 (×7): 10 mg via ORAL
  Filled 2014-05-29 (×7): qty 2

## 2014-05-29 MED ORDER — PROMETHAZINE HCL 25 MG/ML IJ SOLN
6.2500 mg | INTRAMUSCULAR | Status: DC | PRN
Start: 2014-05-29 — End: 2014-05-29

## 2014-05-29 MED ORDER — HYDROMORPHONE HCL 1 MG/ML IJ SOLN
1.0000 mg | INTRAMUSCULAR | Status: DC | PRN
  Administered 2014-05-29 – 2014-05-30 (×7): 1 mg via INTRAVENOUS
  Filled 2014-05-29 (×7): qty 1

## 2014-05-29 MED ORDER — METHOCARBAMOL 1000 MG/10ML IJ SOLN
500.0000 mg | Freq: Four times a day (QID) | INTRAMUSCULAR | Status: DC | PRN
  Filled 2014-05-29: qty 5

## 2014-05-29 MED ORDER — FENTANYL CITRATE (PF) 100 MCG/2ML IJ SOLN
INTRAMUSCULAR | Status: AC
  Filled 2014-05-29: qty 2

## 2014-05-29 MED ORDER — ONDANSETRON HCL 4 MG/2ML IJ SOLN
INTRAMUSCULAR | Status: AC
  Filled 2014-05-29: qty 2

## 2014-05-29 MED ORDER — SODIUM CHLORIDE 0.9 % IJ SOLN
INTRAMUSCULAR | Status: AC
  Filled 2014-05-29: qty 10

## 2014-05-29 MED ORDER — VANCOMYCIN HCL IN DEXTROSE 1-5 GM/200ML-% IV SOLN
1000.0000 mg | Freq: Two times a day (BID) | INTRAVENOUS | Status: AC
  Administered 2014-05-29: 1000 mg via INTRAVENOUS
  Filled 2014-05-29: qty 200

## 2014-05-29 MED ORDER — EPHEDRINE SULFATE 50 MG/ML IJ SOLN
INTRAMUSCULAR | Status: AC
  Filled 2014-05-29: qty 1

## 2014-05-29 SURGICAL SUPPLY — 53 items
BANDAGE ELASTIC 4 VELCRO ST LF (GAUZE/BANDAGES/DRESSINGS) ×2 IMPLANT
BANDAGE ELASTIC 6 VELCRO ST LF (GAUZE/BANDAGES/DRESSINGS) ×2 IMPLANT
BANDAGE ESMARK 6X9 LF (GAUZE/BANDAGES/DRESSINGS) ×1 IMPLANT
BIT DRILL 3.8 CANN DISP (BIT) ×2 IMPLANT
BLADE LONG MED 31X9 (MISCELLANEOUS) ×2 IMPLANT
BLADE SAW SGTL 81X20 HD (BLADE) ×2 IMPLANT
BNDG ESMARK 6X9 LF (GAUZE/BANDAGES/DRESSINGS) ×2
BNDG GAUZE ELAST 4 BULKY (GAUZE/BANDAGES/DRESSINGS) ×2 IMPLANT
COVER MAYO STAND STRL (DRAPES) ×6 IMPLANT
COVER SURGICAL LIGHT HANDLE (MISCELLANEOUS) ×2 IMPLANT
CUFF TOURNIQUET SINGLE 34IN LL (TOURNIQUET CUFF) ×2 IMPLANT
CUFF TOURNIQUET SINGLE 44IN (TOURNIQUET CUFF) IMPLANT
DRAPE C-ARM 42X72 X-RAY (DRAPES) ×2 IMPLANT
DRAPE INCISE IOBAN 66X45 STRL (DRAPES) ×2 IMPLANT
DRAPE PROXIMA HALF (DRAPES) ×2 IMPLANT
DRAPE U-SHAPE 47X51 STRL (DRAPES) ×2 IMPLANT
DRSG PAD ABDOMINAL 8X10 ST (GAUZE/BANDAGES/DRESSINGS) ×2 IMPLANT
DURAPREP 26ML APPLICATOR (WOUND CARE) ×2 IMPLANT
ELECT REM PT RETURN 9FT ADLT (ELECTROSURGICAL) ×2
ELECTRODE REM PT RTRN 9FT ADLT (ELECTROSURGICAL) ×1 IMPLANT
GAUZE SPONGE 4X4 12PLY STRL (GAUZE/BANDAGES/DRESSINGS) ×2 IMPLANT
GAUZE XEROFORM 5X9 LF (GAUZE/BANDAGES/DRESSINGS) ×2 IMPLANT
GLOVE BIOGEL PI IND STRL 8 (GLOVE) ×2 IMPLANT
GLOVE BIOGEL PI INDICATOR 8 (GLOVE) ×2
GLOVE ORTHO TXT STRL SZ7.5 (GLOVE) ×4 IMPLANT
GOWN STRL REUS W/ TWL LRG LVL3 (GOWN DISPOSABLE) ×1 IMPLANT
GOWN STRL REUS W/ TWL XL LVL3 (GOWN DISPOSABLE) ×1 IMPLANT
GOWN STRL REUS W/TWL 2XL LVL3 (GOWN DISPOSABLE) ×2 IMPLANT
GOWN STRL REUS W/TWL LRG LVL3 (GOWN DISPOSABLE) ×1
GOWN STRL REUS W/TWL XL LVL3 (GOWN DISPOSABLE) ×1
KIT BASIN OR (CUSTOM PROCEDURE TRAY) ×2 IMPLANT
KIT ROOM TURNOVER OR (KITS) ×2 IMPLANT
MANIFOLD NEPTUNE II (INSTRUMENTS) IMPLANT
NS IRRIG 1000ML POUR BTL (IV SOLUTION) ×2 IMPLANT
PACK ORTHO EXTREMITY (CUSTOM PROCEDURE TRAY) ×2 IMPLANT
PAD ARMBOARD 7.5X6 YLW CONV (MISCELLANEOUS) ×4 IMPLANT
PAD CAST 4YDX4 CTTN HI CHSV (CAST SUPPLIES) ×1 IMPLANT
PADDING CAST COTTON 4X4 STRL (CAST SUPPLIES) ×1
PADDING CAST COTTON 6X4 STRL (CAST SUPPLIES) ×2 IMPLANT
SCREW LAG CANN CANC 5.0 20X65 (Screw) ×2 IMPLANT
SCREW LAG CANN CANC 5.0 20X70 (Screw) ×4 IMPLANT
SPLINT FIBERGLASS 4X30 (CAST SUPPLIES) ×2 IMPLANT
SPONGE LAP 18X18 X RAY DECT (DISPOSABLE) ×2 IMPLANT
STAPLER VISISTAT 35W (STAPLE) IMPLANT
SUCTION FRAZIER TIP 10 FR DISP (SUCTIONS) ×2 IMPLANT
SUT ETHILON 3 0 PS 1 (SUTURE) ×4 IMPLANT
SUT VIC AB 2-0 CT1 27 (SUTURE) ×2
SUT VIC AB 2-0 CT1 TAPERPNT 27 (SUTURE) ×2 IMPLANT
TOWEL OR 17X24 6PK STRL BLUE (TOWEL DISPOSABLE) ×2 IMPLANT
TOWEL OR 17X26 10 PK STRL BLUE (TOWEL DISPOSABLE) ×2 IMPLANT
TUBE CONNECTING 12X1/4 (SUCTIONS) ×2 IMPLANT
WATER STERILE IRR 1000ML POUR (IV SOLUTION) IMPLANT
YANKAUER SUCT BULB TIP NO VENT (SUCTIONS) ×2 IMPLANT

## 2014-05-29 NOTE — Anesthesia Procedure Notes (Addendum)
Anesthesia Regional Block:  Popliteal block  Pre-Anesthetic Checklist: ,, timeout performed, Correct Patient, Correct Site, Correct Laterality, Correct Procedure, Correct Position, site marked, Risks and benefits discussed,  Surgical consent,  Pre-op evaluation,  At surgeon's request and post-op pain management  Laterality: Left  Prep: chloraprep       Needles:   Needle Type: Stimulator Needle - 80          Additional Needles:  Procedures: Doppler guided Popliteal block Narrative:  Start time: 05/29/2014 12:00 PM End time: 05/29/2014 12:15 PM Injection made incrementally with aspirations every 5 mL.  Performed by: Personally    Procedure Name: Intubation Date/Time: 05/29/2014 1:12 PM Performed by: Merdis Delay Pre-anesthesia Checklist: Patient identified, Emergency Drugs available, Patient being monitored, Timeout performed and Suction available Patient Re-evaluated:Patient Re-evaluated prior to inductionOxygen Delivery Method: Circle system utilized Preoxygenation: Pre-oxygenation with 100% oxygen Intubation Type: IV induction Ventilation: Mask ventilation without difficulty and Oral airway inserted - appropriate to patient size Laryngoscope Size: Mac and 4 Grade View: Grade II Tube type: Oral Tube size: 8.0 mm Number of attempts: 1 Airway Equipment and Method: Stylet and LTA kit utilized Placement Confirmation: ETT inserted through vocal cords under direct vision,  positive ETCO2,  breath sounds checked- equal and bilateral and CO2 detector Secured at: 22 cm Tube secured with: Tape Dental Injury: Teeth and Oropharynx as per pre-operative assessment

## 2014-05-29 NOTE — Anesthesia Preprocedure Evaluation (Addendum)
Anesthesia Evaluation  Patient identified by MRN, date of birth, ID band Patient awake    Reviewed: Allergy & Precautions, NPO status   Airway Mallampati: III  TM Distance: >3 FB Neck ROM: Full    Dental  (+) Teeth Intact, Dental Advisory Given,    Pulmonary sleep apnea , former smoker,  breath sounds clear to auscultation        Cardiovascular hypertension, Rhythm:Regular Rate:Normal     Neuro/Psych    GI/Hepatic negative GI ROS, Neg liver ROS,   Endo/Other  diabetes  Renal/GU Renal disease     Musculoskeletal   Abdominal   Peds  Hematology   Anesthesia Other Findings   Reproductive/Obstetrics                           Anesthesia Physical Anesthesia Plan  ASA: III  Anesthesia Plan: General   Post-op Pain Management:    Induction: Intravenous  Airway Management Planned: Oral ETT  Additional Equipment:   Intra-op Plan:   Post-operative Plan: Extubation in OR  Informed Consent: I have reviewed the patients History and Physical, chart, labs and discussed the procedure including the risks, benefits and alternatives for the proposed anesthesia with the patient or authorized representative who has indicated his/her understanding and acceptance.   Dental advisory given  Plan Discussed with: CRNA  Anesthesia Plan Comments:         Anesthesia Quick Evaluation

## 2014-05-29 NOTE — H&P (Signed)
[name] CampbellDaniel [name] is an 54 y.o. male.   CHIEF COMPLAINT:  Painful bilateral plantar valgus foot deformity.   A 54 year old male returns for followup with persistent left foot pain symptoms.  CT scan done 04/16/2014 is available for review.  We gave him a copy of the report and reviewed the images.  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  Tendons are intact.  We discussed options with him.  Patient has had previous total hip arthroplasty which is doing well.   MEDICATIONS:  Atorvastatin 10 mg daily, metformin 1000 mg b.i.d., hydrochlorothiazide 25 mg daily, losartan 100 mg daily, carvedilol 6.25 mg a day, allopurinol 200 mg daily, Lantus 75 units at night, 20 units in the morning.  No additional sliding scale.  One baby aspirin and glyburide 4.25 mg p.o. daily.   ALLERGIES:  PENICILLIN causes a rash.  He has had Ancef before with no problems.   FAMILY HISTORY:  Positive for diabetes, breast and lung cancer, hypertension.   SOCIAL HISTORY:  Married to his wife Steward DroneBrenda.  He works as an Database administratoraviation inspector.  Does not smoke.  Drinks 2 drinks per week.   Past Medical History  Diagnosis Date  . Hypertension   . Diabetes mellitus without complication   . OA (osteoarthritis) of hip     Hx: of  . Heart murmur     Hx: of as achild  . Kidney stones     Hx: of  . Inguinal hernia     Hx: of left groin  . Sleep apnea     DOESN'T WEAR CPAP - 'CLOSED IN FEELING'.  [facility]rs [facility]    Past Surgical History  Procedure Laterality Date  . Colonoscopy      Hx: of  . 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 Total hip arthroplasty Left 03/02/2013    Procedure: TOTAL HIP ARTHROPLASTY ANTERIOR APPROACH;  Surgeon: Eldred MangesMark C Yates, MD;  Location: [facility] OR;  Service: Orthopedics;  Laterality: Left;  Left Total Hip Arthroplasty Direct Anterior Approach    Family History  Problem Relation Age  of Onset  . Diabetes Mother   . Hypertension Mother   . Cancer - Other Mother   . Cancer - Lung Father   . Arthritis Sister   . Heart disease Sister    Social History:  reports that he has quit smoking. His smoking use included Cigars and Cigarettes. His smokeless tobacco use includes Snuff. He reports that he drinks about 1.2 oz of alcohol per week. He reports that he does not use illicit drugs.  Allergies:  Allergies  Allergen Reactions  . Shellfish Allergy Anaphylaxis  . Penicillins Hives    1977    Medications Prior to Admission  Medication Sig Dispense Refill  . allopurinol (ZYLOPRIM) 300 MG tablet Take 300 mg by mouth daily at 12 noon.    Marland Kitchen. aspirin EC 325 MG tablet Take 1 tablet (325 mg total) by mouth daily. 30 tablet 0  . atorvastatin (LIPITOR) 10 MG tablet Take 10 mg by mouth daily.    Marland Kitchen. b complex vitamins tablet Take 1 tablet by mouth daily.    . carvedilol (COREG) 6.25 MG tablet Take 6.25 mg by mouth 2 (two) times daily with a meal.    . Cholecalciferol (VITAMIN D) 2000 UNITS CAPS Take 2,000 Units by mouth daily.    . Cinnamon 500 MG  TABS Take 1,000 mg by mouth daily.    Marland Kitchen. glimepiride (AMARYL) 4 MG tablet Take 4 mg by mouth 2 (two) times daily with a meal.     . hydrochlorothiazide (HYDRODIURIL) 25 MG tablet Take 25 mg by mouth daily.    . insulin glargine (LANTUS) 100 UNIT/ML injection Inject 25-75 Units into the skin 2 (two) times daily. 25units in the morning and 75 units in the evening    . losartan (COZAAR) 100 MG tablet Take 100 mg by mouth daily.    . metFORMIN (GLUCOPHAGE) 1000 MG tablet Take 2,000 mg by mouth daily.    . naproxen sodium (ANAPROX) 220 MG tablet Take 440 mg by mouth daily as needed (pain).     . Omega-3 Fatty Acids (OMEGA-3 FISH OIL) 300 MG CAPS Take 600 mg by mouth daily.    . Turmeric 500 MG CAPS Take 500 mg by mouth daily.    Marland Kitchen. ascorbic acid (VITAMIN C) 500 MG tablet Take 500 mg by mouth daily.    . methocarbamol (ROBAXIN) 500 MG tablet Take 1  tablet (500 mg total) by mouth every 6 (six) hours as needed for muscle spasms (spasm). (Patient not taking: Reported on 05/15/2014) 30 tablet 0  . oxyCODONE-acetaminophen (ROXICET) 5-325 MG per tablet Take 1-2 tablets by mouth every 4 (four) hours as needed. (Patient not taking: Reported on 05/15/2014) 60 tablet 0    No results found for this or any previous visit (from the past 48 hour(s)). No results found.  ROS REVIEW OF SYSTEMS:  Positive for hypertension, diabetes, increased MRI.  There were no vitals taken for this visit. Physical Exam  PHYSICAL EXAMINATION:  Patient is 6'4", 335 pounds.  Alert and oriented.  Good visual acuity with glasses.  WDWN.  Extraocular movements intact.  No plantar foot lesions.  Bilateral plantar valgus feet with grade 4 planus 30 degrees hindfoot valgus.  Internally rotates 45 degrees both hips. No pain with left hip.  Negative Trendelenburg in left hip.  Distal pulses are 2+.   ASSESSMENT:  Status post left total hip arthroplasty doing well.  Bilateral painful progressive plantar valgus foot deformity.   PLAN:  Triple arthrodesis with possible Achilles tendon lengthening for correction of his fixed deformity.  Length of time for this to heal and possible use of a wheelchair.  Likely 2 nights stay in the hospital and he understands it will take about 3 months for this to heal and potential for pseudoarthrosis.  Need for additional surgery, bone grafting discussed.  All questions answered.  Problems with hardware, skin breakdown, hyperglycemia, DVT, heart attack discussed.  All questions answered and he requested to proceed.  Lorenda Grecco M 05/29/2014, 10:25 AM

## 2014-05-29 NOTE — Transfer of Care (Signed)
Immediate Anesthesia Transfer of Care Note  Patient: [name] CampbellDaniel [name]  Procedure(s) Performed: Procedure(s): Left Triple Arthrodesis, Possible Achilles Lengthening (Left)  Patient Location: PACU  Anesthesia Type:General  Level of Consciousness: awake, alert  and oriented  Airway & Oxygen Therapy: Patient Spontanous Breathing and Patient connected to nasal cannula oxygen  Post-op Assessment: Report given to RN and Post -op Vital signs reviewed and stable  Post vital signs: Reviewed and stable  Last Vitals:  Filed Vitals:   05/29/14 1039  BP: 128/73  Pulse:   Temp: 36.8 C  Resp:     Complications: No apparent anesthesia complications

## 2014-05-29 NOTE — Interval H&P Note (Signed)
History and Physical Interval Note:  05/29/2014 12:23 PM  [name] [name]  has presented today for surgery, with the diagnosis of Left Planovalgus Foot Deformity, Subtalar Arthritis  The various methods of treatment have been discussed with the patient and family. After consideration of risks, benefits and other options for treatment, the patient has consented to  Procedure(s): Left Triple Arthrodesis, Possible Achilles Lengthening (Left) as a surgical intervention .  The patient's history has been reviewed, patient examined, no change in status, stable for surgery.  I have reviewed the patient's chart and labs.  Questions were answered to the patient's satisfaction.     [name] [name]

## 2014-05-30 ENCOUNTER — Encounter (HOSPITAL_COMMUNITY): Payer: Self-pay | Admitting: General Practice

## 2014-05-30 DIAGNOSIS — M19072 Primary osteoarthritis, left ankle and foot: Secondary | ICD-10-CM | POA: Diagnosis not present

## 2014-05-30 LAB — GLUCOSE, CAPILLARY
GLUCOSE-CAPILLARY: 111 mg/dL — AB (ref 70–99)
Glucose-Capillary: 101 mg/dL — ABNORMAL HIGH (ref 70–99)
Glucose-Capillary: 162 mg/dL — ABNORMAL HIGH (ref 70–99)
Glucose-Capillary: 168 mg/dL — ABNORMAL HIGH (ref 70–99)

## 2014-05-30 MED ORDER — OXYCODONE HCL ER 10 MG PO T12A
10.0000 mg | EXTENDED_RELEASE_TABLET | Freq: Two times a day (BID) | ORAL | Status: DC
  Administered 2014-05-30 – 2014-06-01 (×4): 10 mg via ORAL
  Filled 2014-05-30 (×4): qty 1

## 2014-05-30 NOTE — Evaluation (Signed)
Physical Therapy Evaluation Patient Details Name: [name] [name] MRN: [id number] DOB: [birth year] Today's Date: 05/30/2014   History of Present Illness  Pt is a 54 y/o M s/p L foot triple arthrodesis.  Pt additionally has painful R foot plantar valgus deformity which prevents pt from ambulating long distances 2/2 pain.  Pt's PMH includes HTN, DM, and L THA.  Clinical Impression  Patient is s/p above surgery resulting in functional limitations due to the deficits listed below (see PT Problem List). Pt will need to complete knee walker training at next session.  Limited to stand pivot transfer this session 2/2 pt fatigue and pain in L foot.  Pt is willing to participate in therapy.  Determine need for WC at next session depending on pt's performance w/ knee scooter.  Patient will benefit from skilled PT to increase their independence and safety with mobility to allow discharge to the venue listed below.      Follow Up Recommendations Home health PT;Supervision for mobility/OOB    Equipment Recommendations  None recommended by PT;Wheelchair (measurements PT);Wheelchair cushion (measurements PT) (potentially WC depending on pts performance w/ knee scooter)    Recommendations for Other Services OT consult     Precautions / Restrictions Precautions Precautions: Fall Required Braces or Orthoses: Other Brace/Splint Other Brace/Splint: splint LLE Restrictions Weight Bearing Restrictions: Yes LLE Weight Bearing: Non weight bearing      Mobility  Bed Mobility Overal bed mobility: Modified Independent             General bed mobility comments: Max use of bed rails, cues for proper sequencing, increased time.  Cues to maintain breathing as pt demonstrates valsalva.  Transfers Overall transfer level: Needs assistance Equipment used: Rolling walker (2 wheeled) Transfers: Sit to/from UGI CorporationStand;Stand Pivot Transfers Sit to Stand: Min assist;From elevated surface Stand pivot transfers: Min  assist       General transfer comment: Min A for sit>stand and stand pivot to ensure pt maintains balance.  Pt responds well to cues to take his time which allows him to maintain his balance.  Pt w/ slight LOB x1 during stand pivot but is able to stabilize w/ min A and using RW.  Ambulation/Gait             General Gait Details: pivotal steps only this session 2/2 pt's fatigue follow stand pivot and request to defer this to next session  Stairs            Wheelchair Mobility    Modified Rankin (Stroke Patients Only)       Balance Overall balance assessment: Needs assistance Sitting-balance support: Feet supported;Bilateral upper extremity supported (RLE supported) Sitting balance-Leahy Scale: Fair     Standing balance support: Bilateral upper extremity supported;During functional activity Standing balance-Leahy Scale: Poor                               Pertinent Vitals/Pain Pain Assessment: 0-10 Pain Score: 6  Pain Location: L foot Pain Descriptors / Indicators: Aching;Throbbing Pain Intervention(s): Limited activity within patient's tolerance;Monitored during session;Repositioned    Home Living Family/patient expects to be discharged to:: Private residence Living Arrangements: Spouse/significant other Available Help at Discharge: Family;Available 24 hours/day (wife available 24/7 for 2 weeks after d/c) Type of Home: House Home Access: Ramped entrance     Home Layout: One level Home Equipment: Cane - single point;Walker - 2 wheels;Bedside commode (Knee scooter) Additional Comments: Has two steps to get down  to his bedroom but has bought a rampt for this transition    Prior Function Level of Independence: Independent         Comments: Can only walk short distances ~200 ft prior to surgery 2/2 pain in B feet.  For walking distances farther than this pt would need to hold onto something for support (grocery cart)     Hand Dominance    Dominant Hand: Left    Extremity/Trunk Assessment   Upper Extremity Assessment: Defer to OT evaluation           Lower Extremity Assessment: LLE deficits/detail;RLE deficits/detail RLE Deficits / Details: see history LLE Deficits / Details: weakness and limited ROM as expected s/p surgery     Communication   Communication: No difficulties  Cognition Arousal/Alertness: Awake/alert Behavior During Therapy: WFL for tasks assessed/performed Overall Cognitive Status: Within Functional Limits for tasks assessed                      General Comments      Exercises Total Joint Exercises Ankle Circles/Pumps: AROM;10 reps;Supine;Right Quad Sets: AROM;Both;5 reps;Supine Straight Leg Raises: AROM;Both;10 reps;Supine      Assessment/Plan    PT Assessment Patient needs continued PT services  PT Diagnosis Difficulty walking;Abnormality of gait;Acute pain;Generalized weakness   PT Problem List Decreased strength;Decreased range of motion;Decreased activity tolerance;Decreased balance;Decreased mobility;Decreased coordination;Decreased knowledge of use of DME;Decreased safety awareness;Decreased knowledge of precautions;Impaired sensation;Decreased skin integrity;Pain;Obesity  PT Treatment Interventions DME instruction;Gait training;Functional mobility training;Therapeutic activities;Therapeutic exercise;Balance training;Neuromuscular re-education;Patient/family education;Modalities;Wheelchair mobility training   PT Goals (Current goals can be found in the Care Plan section) Acute Rehab PT Goals Patient Stated Goal: to get stronger PT Goal Formulation: With patient Time For Goal Achievement: 06/06/14 Potential to Achieve Goals: Good    Frequency Min 4X/week   Barriers to discharge        Co-evaluation               End of Session Equipment Utilized During Treatment: Gait belt Activity Tolerance: Patient limited by fatigue Patient left: in chair;with call  bell/phone within reach;with family/visitor present;with nursing/sitter in room Nurse Communication: Mobility status;Precautions;Weight bearing status    Functional Assessment Tool Used: clinical judgement Functional Limitation: Mobility: Walking and moving around Mobility: Walking and Moving Around Current Status [phone](G8978): At least 40 percent but less than 60 percent impaired, limited or restricted Mobility: Walking and Moving Around Goal Status [phone](G8979): At least 20 percent but less than 40 percent impaired, limited or restricted    Time: 1130-1203 PT Time Calculation (min) (ACUTE ONLY): 33 min   Charges:   PT Evaluation $Initial PT Evaluation Tier I: 1 Procedure PT Treatments $Therapeutic Exercise: 8-22 mins   PT G Codes:   PT G-Codes **NOT FOR INPATIENT CLASS** Functional Assessment Tool Used: clinical judgement Functional Limitation: Mobility: Walking and moving around Mobility: Walking and Moving Around Current Status ([id number](G8978): At least 40 percent but less than 60 percent impaired, limited or restricted Mobility: Walking and Moving Around Goal Status [phone](G8979): At least 20 percent but less than 40 percent impaired, limited or restricted   Michail JewelsAshley Parr PT, DPT [phone] Pager: [phone] 05/30/2014, 12:17 PM

## 2014-05-30 NOTE — Progress Notes (Signed)
Talked with Dr Ophelia CharterYates about patient getting no relief from oxycodone tablets. He is still getting IV dilaudid which helps his pain some. See mar for new orders. Jacqulynn CadetPotter, Padraig Nhan A

## 2014-05-30 NOTE — Op Note (Signed)
[name] [name]:  [name] [name]               ACCOUNT NO.:  [id number]  MEDICAL RECORD NO.:  [id number]  LOCATION:  [id number]                        FACILITY:  [facility]  PHYSICIAN:  [name] [name], M.D.    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  Congenital talus malformation.  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  PROCEDURE:  Left triple arthrodesis.  SURGEON:  [name] [name], M.D.  ASSISTANT:  Genene ChurnJames M. Barry Dieneswens, PA-C, medically necessary and present for the entire procedure.  ANESTHESIA:  General.  FIXATION:  Two 5-0 cannulated Biomet screws, 65, 70 mm.  TOURNIQUET TIME:  An hour and 10 minutes x350.  HISTORY:  A 54 year old male with painful planovalgus feet.  He has to wear boot all the times.  His pain has progressed to the point where he is having difficulty ambulating.  Does have diabetes, weight is up with 156 kg.  CT scan showed severe talus congenital deformity with some ankle arthrosis, but no severe subtalar arthrosis and calcaneocuboid.  DESCRIPTION OF PROCEDURE:  After standard prepping and draping, preoperative Ancef prophylaxis, extremity sheets, and drapes were applied.  Sterile skin marker, sterile gloves over the toes.  Leg was wrapped in Esmarch, tourniquet inflated.  Time-out procedure being completed.  Incision was made midway between the tip of the fibula and the base of the fifth obliquely following the skin folds.  Laterally, the peroneal tendons were the borders.  Sural nerve and accompanying vein were visualized and preserved.  Medially, the extensor tendons were noted.  Brevis was peeled back, __________ was taken off from the oscillating saw.  Calcaneocuboid joint and periosteal flap was taken underneath the cuboid  and then proximally exposed the subtalar joint and subtalar joint was cleaned out using combination of oscillating saw, osteotome angled, and straight curettes.  Subtalar joint was clean.  Due to the deformity of the talus, which was angled 45 degrees medially with a serpentine foot deformity, medial incision was made over the navicular.  For the cut for exposure of the talonavicular joint, wedge was taken, taking more bone toward the plantar surface and starting at the dorsal surface of the foot to help improve the planus deformity of his foot to recreate an arch.  The rest of foot was completely flapped, but at least with the correction of this joint, there was some improvement in his arch.  Next, the talus was cut off.  Bone was meticulously cleaned of soft tissues for later bone graft.  Subtalar joint was cleaned off on the medial side.  Talonavicular joint was cleaned, rounded off, and then the C-arm was brought in and checked. There was good resection of bone.  Foot was then shifted correcting the varus deformity in the mid foot and hind foot and then increasing the arch and K-wires were placed from navicular into the talus on the medial aspect and cuboid into calcaneus on the lateral aspect.  Pins were adjusted, reamed and then screws were placed.  Initially, the screw  over the cuboid calcaneal joint was little bit long, was exchanged from 70-60 since it barely went through the cortex, but still gave some cortical bite.  Not palpable on the plantar surface of the foot.  Position would not affect the FHL tendon.  __________ were copiously irrigated.  All remaining pieces of cartilage were cleaned and then the bone meticulously prepared, was packed with care taken of packing the talonavicular joint both medial and lateral, also subtalar joint and calcaneocuboid joint.  There was correction of the foot and under both x- ray and visualization, it was improved.  The Achilles tendon did  not need to be released.  Tourniquet was deflated.  Subcutaneous tissue reapproximated with 2-0 and then a 2-0 nylon placed in the skin or 3-0 nylon interrupted.  Xeroform, 4x4s, another pack 4x4s, 3 ABDs, Webril, and fiberglass short-leg splints were applied.  The patient tolerated the procedure well, was transferred to recovery room in stable condition.     [name] [name], M.D.     MCY/MEDQ  D:  05/29/2014  T:  05/30/2014  Job:  [id number]

## 2014-05-30 NOTE — Progress Notes (Signed)
Subjective: 1 Day Post-Op Procedure(s) (LRB): Left Triple Arthrodesis, Possible Achilles Lengthening (Left) Patient reports pain as severe.    Objective: Vital signs in last 24 hours: Temp:  [97.9 F (36.6 C)-100.1 F (37.8 C)] 100.1 F (37.8 C) (05/05 0625) Pulse Rate:  [72-96] 96 (05/05 0625) Resp:  [15-27] 18 (05/05 0625) BP: (102-157)/(58-94) 126/65 mmHg (05/05 0625) SpO2:  [92 %-100 %] 92 % (05/05 0625) Weight:  [156.945 kg (346 lb)] 156.945 kg (346 lb) (05/04 1038)  Intake/Output from previous day: 05/04 0701 - 05/05 0700 In: 1850 [P.O.:250; I.V.:1600] Out: 1015 [Urine:1000; Blood:15] Intake/Output this shift:    No results for input(s): HGB in the last 72 hours. No results for input(s): WBC, RBC, HCT, PLT in the last 72 hours. No results for input(s): NA, K, CL, CO2, BUN, CREATININE, GLUCOSE, CALCIUM in the last 72 hours. No results for input(s): LABPT, INR in the last 72 hours.  Neurologically intact  Assessment/Plan: 1 Day Post-Op Procedure(s) (LRB): Left Triple Arthrodesis, Possible Achilles Lengthening (Left) Up with therapy possible home Friday afternoon or Sat.   Bloody drainage thru dressing as expected. Overwrap.   Dazja Houchin C 05/30/2014, 7:27 AM

## 2014-05-31 ENCOUNTER — Observation Stay (HOSPITAL_COMMUNITY): Payer: BLUE CROSS/BLUE SHIELD

## 2014-05-31 ENCOUNTER — Encounter (HOSPITAL_COMMUNITY): Payer: Self-pay | Admitting: Radiology

## 2014-05-31 DIAGNOSIS — J9601 Acute respiratory failure with hypoxia: Secondary | ICD-10-CM

## 2014-05-31 DIAGNOSIS — R0902 Hypoxemia: Secondary | ICD-10-CM | POA: Diagnosis not present

## 2014-05-31 DIAGNOSIS — R5082 Postprocedural fever: Secondary | ICD-10-CM

## 2014-05-31 DIAGNOSIS — Z880 Allergy status to penicillin: Secondary | ICD-10-CM | POA: Diagnosis not present

## 2014-05-31 DIAGNOSIS — Z91013 Allergy to seafood: Secondary | ICD-10-CM | POA: Diagnosis not present

## 2014-05-31 DIAGNOSIS — M19072 Primary osteoarthritis, left ankle and foot: Secondary | ICD-10-CM | POA: Diagnosis not present

## 2014-05-31 DIAGNOSIS — Q666 Other congenital valgus deformities of feet: Secondary | ICD-10-CM | POA: Diagnosis not present

## 2014-05-31 LAB — BLOOD GAS, ARTERIAL
ACID-BASE EXCESS: 8.3 mmol/L — AB (ref 0.0–2.0)
Bicarbonate: 32.6 mEq/L — ABNORMAL HIGH (ref 20.0–24.0)
Drawn by: 313941
FIO2: 0.21 %
O2 Saturation: 89.3 %
PATIENT TEMPERATURE: 99.4
PCO2 ART: 48.3 mmHg — AB (ref 35.0–45.0)
TCO2: 34 mmol/L (ref 0–100)
pH, Arterial: 7.446 (ref 7.350–7.450)
pO2, Arterial: 52.7 mmHg — ABNORMAL LOW (ref 80.0–100.0)

## 2014-05-31 LAB — COMPREHENSIVE METABOLIC PANEL
ALBUMIN: 3.2 g/dL — AB (ref 3.5–5.0)
ALK PHOS: 58 U/L (ref 38–126)
ALT: 23 U/L (ref 17–63)
AST: 22 U/L (ref 15–41)
Anion gap: 9 (ref 5–15)
BUN: 13 mg/dL (ref 6–20)
CALCIUM: 8.8 mg/dL — AB (ref 8.9–10.3)
CO2: 33 mmol/L — ABNORMAL HIGH (ref 22–32)
Chloride: 91 mmol/L — ABNORMAL LOW (ref 101–111)
Creatinine, Ser: 1.03 mg/dL (ref 0.61–1.24)
GFR calc Af Amer: >60 mL/min (ref >60)
GFR calc non Af Amer: >60 mL/min (ref >60)
Glucose, Bld: 182 mg/dL — ABNORMAL HIGH (ref 70–99)
POTASSIUM: 3.2 mmol/L — AB (ref 3.5–5.1)
SODIUM: 133 mmol/L — AB (ref 135–145)
TOTAL PROTEIN: 6.4 g/dL — AB (ref 6.5–8.1)
Total Bilirubin: 0.5 mg/dL (ref 0.3–1.2)

## 2014-05-31 LAB — CBC WITH DIFFERENTIAL/PLATELET
Basophils Absolute: 0 10*3/uL (ref 0.0–0.1)
Basophils Relative: 0 % (ref 0–1)
EOS PCT: 2 % (ref 0–5)
Eosinophils Absolute: 0.1 10*3/uL (ref 0.0–0.7)
HCT: 38.2 % — ABNORMAL LOW (ref 39.0–52.0)
Hemoglobin: 13.2 g/dL (ref 13.0–17.0)
LYMPHS ABS: 2 10*3/uL (ref 0.7–4.0)
LYMPHS PCT: 21 % (ref 12–46)
MCH: 30.6 pg (ref 26.0–34.0)
MCHC: 34.6 g/dL (ref 30.0–36.0)
MCV: 88.4 fL (ref 78.0–100.0)
Monocytes Absolute: 0.8 10*3/uL (ref 0.1–1.0)
Monocytes Relative: 8 % (ref 3–12)
NEUTROS PCT: 69 % (ref 43–77)
Neutro Abs: 6.6 10*3/uL (ref 1.7–7.7)
PLATELETS: 191 10*3/uL (ref 150–400)
RBC: 4.32 MIL/uL (ref 4.22–5.81)
RDW: 12.6 % (ref 11.5–15.5)
WBC: 9.6 10*3/uL (ref 4.0–10.5)

## 2014-05-31 LAB — URINALYSIS, ROUTINE W REFLEX MICROSCOPIC
BILIRUBIN URINE: NEGATIVE
Glucose, UA: NEGATIVE mg/dL
Ketones, ur: NEGATIVE mg/dL
Leukocytes, UA: NEGATIVE
NITRITE: NEGATIVE
Protein, ur: NEGATIVE mg/dL
SPECIFIC GRAVITY, URINE: 1.016 (ref 1.005–1.030)
Urobilinogen, UA: 0.2 mg/dL (ref 0.0–1.0)
pH: 5.5 (ref 5.0–8.0)

## 2014-05-31 LAB — URINE MICROSCOPIC-ADD ON

## 2014-05-31 LAB — GLUCOSE, CAPILLARY
GLUCOSE-CAPILLARY: 172 mg/dL — AB (ref 70–99)
Glucose-Capillary: 156 mg/dL — ABNORMAL HIGH (ref 70–99)
Glucose-Capillary: 149 mg/dL — ABNORMAL HIGH (ref 70–99)
Glucose-Capillary: 198 mg/dL — ABNORMAL HIGH (ref 70–99)

## 2014-05-31 LAB — TROPONIN I: Troponin I: <0.03 ng/mL (ref <0.031)

## 2014-05-31 MED ORDER — METHOCARBAMOL 750 MG PO TABS: 750.0000 mg | ORAL_TABLET | Freq: Four times a day (QID) | ORAL | Status: DC | PRN

## 2014-05-31 MED ORDER — HYDROMORPHONE HCL 2 MG PO TABS: 2.0000 mg | ORAL_TABLET | Freq: Four times a day (QID) | ORAL | Status: DC | PRN

## 2014-05-31 MED ORDER — IOHEXOL 350 MG/ML SOLN
75.0000 mL | Freq: Once | INTRAVENOUS | Status: AC | PRN
  Administered 2014-05-31: 75 mL via INTRAVENOUS

## 2014-05-31 NOTE — Progress Notes (Signed)
Physical Therapy Treatment Patient Details Name: [name] CampbellDaniel [name] MRN: [id number] DOB: 26-Nov-1960 Today's Date: 05/31/2014    History of Present Illness Pt is a 54 y/o M s/p L foot triple arthrodesis.  Pt additionally has painful R foot plantar valgus deformity which prevents pt from ambulating long distances 2/2 pain.  Pt's PMH includes HTN, DM, and L THA.    PT Comments    Pt demonstrated ability to perform squat pivot transfer from bed>WC and WC>recliner chair.  Additionally pt managed WC 400 ft and performed backward and forward propulsion and turning w/ min verbal cues for technique.  Unable to practice using knee walker this session 2/2 weight limit on knee walker.  Pt has knee walker at home w/ weight limit of 450lbs and will practice knee walker use w/ HHPT once appropriate. Pt's SpO2 on room air: 91% sitting prior to transfer, 95% during WC mobility, 85% during squat pivot transfer.   Follow Up Recommendations  Home health PT;Supervision for mobility/OOB     Equipment Recommendations  Wheelchair (measurements PT);Wheelchair cushion (measurements PT) (Bariatric WC w/ L elevating leg rest)    Recommendations for Other Services OT consult     Precautions / Restrictions Precautions Precautions: Fall Required Braces or Orthoses: Other Brace/Splint Other Brace/Splint: splint LLE Restrictions Weight Bearing Restrictions: Yes LLE Weight Bearing: Non weight bearing    Mobility  Bed Mobility Overal bed mobility: Modified Independent             General bed mobility comments: Cues to maintain breathing as pt demonstrates valsava.  Mod use of bed rails.  Transfers Overall transfer level: Needs assistance Equipment used: None Transfers: Stand Pivot Transfers   Stand pivot transfers: Min assist       General transfer comment: Min A during squat pivot from bed>WC and WC>recliner.  Demonstration and verbal cues for proper technique.    Ambulation/Gait                  Administratortairs            Wheelchair Mobility Wheelchair Mobility Wheelchair mobility: Yes Wheelchair propulsion: Both upper extremities;Right lower extremity Wheelchair parts: Supervision/cueing Distance: 400 Wheelchair Assistance Details (indicate cue type and reason): Cues for proper technique, pt demonstrated ability to perform steering backwards, forwards, and turning.  Pt educated on safety features of WC and how to bring arm of chair off during transfers and back on.   Modified Rankin (Stroke Patients Only)       Balance Overall balance assessment: Needs assistance Sitting-balance support: Bilateral upper extremity supported;Feet supported Sitting balance-Leahy Scale: Fair                              Cognition Arousal/Alertness: Awake/alert Behavior During Therapy: WFL for tasks assessed/performed Overall Cognitive Status: Within Functional Limits for tasks assessed                      Exercises Total Joint Exercises Ankle Circles/Pumps: AROM;10 reps;Supine;Right Quad Sets: AROM;Both;5 reps;Supine    General Comments General comments (skin integrity, edema, etc.): Pt's SpO2 on room air: 91% sitting prior to transfer, 95% during WC mobility, 85% during squat pivot transfer.      Pertinent Vitals/Pain Pain Assessment: 0-10 Pain Score: 3  Pain Location: L foot Pain Descriptors / Indicators: Throbbing;Tightness Pain Intervention(s): Limited activity within patient's tolerance;Monitored during session;Repositioned    Home Living  Prior Function            PT Goals (current goals can now be found in the care plan section) Acute Rehab PT Goals Patient Stated Goal: to go home today Progress towards PT goals: Progressing toward goals    Frequency  Min 4X/week    PT Plan Current plan remains appropriate    Co-evaluation             End of Session Equipment Utilized During Treatment: Gait  belt Activity Tolerance: Treatment limited secondary to medical complications (Comment);Patient tolerated treatment well (limited by SpO2) Patient left: in chair;with call bell/phone within reach;with family/visitor present     Time: [id number] PT Time Calculation (min) (ACUTE ONLY): 31 min  Charges:  $Therapeutic Activity: 8-22 mins $Wheel Chair Management: 8-22 mins                    G Codes:      Michail JewelsAshley Parr PT, Tennessee[place]PT [phone] Pager: [phone] 05/31/2014, 11:40 AM

## 2014-05-31 NOTE — Progress Notes (Signed)
UR completed 

## 2014-05-31 NOTE — Progress Notes (Signed)
Subjective: Doing well this morning.  Pain controlled.  Has ambulated with PT.  Ready to go home.    Objective: Vital signs in last 24 hours: Temp:  [99.2 F (37.3 C)-100.6 F (38.1 C)] 99.2 F (37.3 C) (05/06 0641) Pulse Rate:  [98-109] 98 (05/06 0641) Resp:  [18-93] 18 (05/06 0641) BP: (114-148)/(61-69) 136/67 mmHg (05/06 0641) SpO2:  [90 %-94 %] 93 % (05/06 0641)  Intake/Output from previous day: 05/05 0701 - 05/06 0700 In: 960 [P.O.:960] Out: 3925 [Urine:3925] Intake/Output this shift: Total I/O In: -  Out: 250 [Urine:250]  No results for input(s): HGB in the last 72 hours. No results for input(s): WBC, RBC, HCT, PLT in the last 72 hours. No results for input(s): NA, K, CL, CO2, BUN, CREATININE, GLUCOSE, CALCIUM in the last 72 hours. No results for input(s): LABPT, INR in the last 72 hours.  Exam:   Splint intact.  Moves toes well.   Assessment/Plan: D/c home today after works with PT.  Scripts on chart for dilaudid and robaxin.  F/u in one week with Dr Ophelia CharterYates.     Erique Kaser M 05/31/2014, 10:39 AM

## 2014-05-31 NOTE — Care Management Note (Signed)
Case Management Note  Patient Details  Name: [name] [name] MRN: [id number] Date of Birth: [birth year]  Subjective/Objective:                    Action/Plan:   Expected Discharge Date:                  Expected Discharge Plan:     In-House Referral:     Discharge planning Services  CM Consult  Post Acute Care Choice:  Durable Medical Equipment Choice offered to:     DME Arranged:  Wheelchair manual DME Agency:  [facility]  [facility]     Status of Service:  Completed, signed off  Medicare Important Message Given:    Date Medicare IM Given:    Medicare IM give by:    Date Additional Medicare IM Given:    Additional Medicare Important Message give by:     If discussed at Long Length of Stay Meetings, dates discussed:    Additional Comments:  [name], [name], RN 05/31/2014, 1:32 PM

## 2014-05-31 NOTE — Progress Notes (Signed)
PA notified for possible home oxygen use for discharge.

## 2014-05-31 NOTE — Anesthesia Postprocedure Evaluation (Signed)
  Anesthesia Post-op Note  Patient: [name] [name]  Procedure(s) Performed: Procedure(s): Left Triple Arthrodesis, Possible Achilles Lengthening (Left)  Patient Location: PACU  Anesthesia Type:General  Level of Consciousness: awake  Airway and Oxygen Therapy: Patient Spontanous Breathing  Post-op Pain: mild  Post-op Assessment: Post-op Vital signs reviewed  Post-op Vital Signs: Reviewed  Last Vitals:  Filed Vitals:   05/31/14 0641  BP: 136/67  Pulse: 98  Temp: 37.3 C  Resp: 18    Complications: No apparent anesthesia complications

## 2014-05-31 NOTE — Progress Notes (Addendum)
Patient 02 sats b/t 88-98 while lying in bed.  They have fallen below 85% on room air while napping this afternoon, encouraged to cough and deep breath and use IS.  Patient continues to refuse C-PAP and oxygen at this time stating "It makes me feel uncomfortable".   No s/s of hypoxia including SOB or chest pain.  Nsg to continue to monitor for status changes.

## 2014-05-31 NOTE — Consult Note (Addendum)
[facility] Consult Note   Patient name: [name] [name] Medical record number: [id number] Date of birth: 1960/11/13 Age: 54 y.o. Gender: male  Primary Care Provider: Rolm GalaGRANDIS, HEIDI, MD Attending: Ophelia CharterYates   Chief Complaint: acute resp failure w/ hypoxia, postoperative fever   History of Present Illness:This is a 54 y.o. year old male with significant past medical history of morbid obesity, OSA, type 2 DM who was admitted 5/4 for painful bilateral plantar valgus foot deformity now with post operative fever and acuter resp failure w/ hypoxia. Patient noted to have been admitted on May 4 for painful bilateral plantar valgus foot deformity. Status post arthrodesis. Per report, patient has had low-grade temps up to 100.6 over the past 25 hours. Patient also with new reading of's hypoxia with O2 sats into the upper 80s on room air. Patient currently denies any systemic symptoms including chest pain, shortness of breath. Patient has no some mild increased urination. No nausea or vomiting. Current vitals at temperature of 99.4, heart rate in the 90s to 100s, respirations in the tens, pressure in the 110s to 140s. Now back to satting in the mid 90s on room air. Stat labs ordered including CBC and CMP. White count 9.6, hemoglobin 13.2. CMP still pending. Chest x-ray without acute infiltrate. EKG NSR.   Assessment and Plan: [name] [name] is a 54 y.o. year old male presenting with acute resp failure w/ hypoxia, postoperative fever Active Problems:   Status post arthrodesis   Postoperative fever   Acute respiratory failure with hypoxia   1- Acute resp failure w/ hypoxia -transient at present  -asymptomatic-no subjective complaint of dyspnea -EKG NSR  -CXR negative for infiltrate  -given post operative state w/ concominant intermittnt tachycardia, obtain CTAngio r/o PE  -suspect possible component of OSA -supplemental O2 prn  -f/u imaging   2-postoperative fever  -r/o infectious etiologies  including PNA, UTI -CXR w/o infiltrate-f/u CT Angio  -UA  -surgical site appears stable  -meets sepsis criteria transiently by HR and T-however pt non toxic-well appearing-asymptomatic-hemodynamically stable  -follow studies-start sepsis protocol as clinically indicated  -follow   3- DM -hold orals -SSI  4-bilateral plantar valgus foot deformity  -s/p arthrodesis -per ortho   5-HTN -BP stable  -cont current regimen  Patient Active Problem List   Diagnosis Date Noted  . Status post arthrodesis 05/29/2014  . Osteoarthritis of left hip 03/02/2013   Past Medical History: Past Medical History  Diagnosis Date  . Hypertension   . OA (osteoarthritis) of hip     Hx: of  . Heart murmur     Hx: of as achild  . Kidney stones     Hx: of  . Inguinal hernia     Hx: of left groin  . Sleep apnea     DOESN'T WEAR CPAP - 'CLOSED IN FEELING'.  [facility] yrs [facility]  . Diabetes mellitus without complication     type 2     Past Surgical History: Past Surgical History  Procedure Laterality Date  . Colonoscopy      Hx: of  . 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 Total hip arthroplasty Left 03/02/2013    Procedure: TOTAL HIP ARTHROPLASTY ANTERIOR APPROACH;  Surgeon: Eldred MangesMark C Yates, MD;  Location: [facility] OR;  Service: Orthopedics;  Laterality: Left;  Left Total Hip Arthroplasty Direct Anterior Approach  . Foot arthrodesis, triple Left 05/29/2014    Social History: History   Social History  . Marital Status:  Married    Spouse Name: N/A  . Number of Children: N/A  . Years of Education: N/A   Social History Main Topics  . Smoking status: Former Smoker    Types: Cigars, Cigarettes  . Smokeless tobacco: Current User    Types: Snuff     Comment: Quit smoking 1998  . Alcohol Use: 1.2 oz/week    2 Shots of liquor per week     Comment: once a week   . Drug Use: No  . Sexual Activity: Not on file   Other Topics Concern  . None   Social History Narrative     Family History: Family History  Problem Relation Age of Onset  . Diabetes Mother   . Hypertension Mother   . Cancer - Other Mother   . Cancer - Lung Father   . Arthritis Sister   . Heart disease Sister     Allergies: Allergies  Allergen Reactions  . Shellfish Allergy Anaphylaxis  . Penicillins Hives    1977    Current Facility-Administered Medications  Medication Dose Route Frequency Provider Last Rate Last Dose  . acetaminophen (TYLENOL) tablet 650 mg  650 mg Oral Q6H PRN Naida SleightJames M Owens, PA-C   650 mg at 05/30/14 0631   Or  . acetaminophen (TYLENOL) suppository 650 mg  650 mg Rectal Q6H PRN Naida SleightJames M Owens, PA-C      . allopurinol (ZYLOPRIM) tablet 300 mg  300 mg Oral Q1200 Naida SleightJames M Owens, PA-C   300 mg at 05/31/14 1250  . aspirin EC tablet 325 mg  325 mg Oral Daily Naida SleightJames M Owens, PA-C   325 mg at 05/31/14 1034  . atorvastatin (LIPITOR) tablet 10 mg  10 mg Oral Daily Naida SleightJames M Owens, PA-C   10 mg at 05/31/14 1036  . carvedilol (COREG) tablet 6.25 mg  6.25 mg Oral BID WC Naida SleightJames M Owens, PA-C   6.25 mg at 05/31/14 1035  . docusate sodium (COLACE) capsule 100 mg  100 mg Oral BID Naida SleightJames M Owens, PA-C   100 mg at 05/31/14 1035  . glimepiride (AMARYL) tablet 4 mg  4 mg Oral BID WC Naida SleightJames M Owens, PA-C   4 mg at 05/31/14 1035  . hydrochlorothiazide (HYDRODIURIL) tablet 25 mg  25 mg Oral Daily Naida SleightJames M Owens, PA-C   25 mg at 05/31/14 1036  . HYDROmorphone (DILAUDID) injection 1 mg  1 mg Intravenous Q1H PRN Naida SleightJames M Owens, PA-C   1 mg at 05/30/14 2319  . insulin glargine (LANTUS) injection 25 Units  25 Units Subcutaneous Daily Eldred MangesMark C Yates, MD   25 Units at 05/31/14 1036  . insulin glargine (LANTUS) injection 65 Units  65 Units Subcutaneous QHS Naida SleightJames M Owens, PA-C   65 Units at 05/30/14 2050  . lactated ringers infusion   Intravenous Continuous Judie Petitharlene Edwards, MD 50 mL/hr at 05/29/14 1128    . losartan (COZAAR) tablet 100 mg  100 mg Oral Daily Naida SleightJames M Owens, PA-C   100 mg at 05/31/14 1035  .  metFORMIN (GLUCOPHAGE) tablet 1,000 mg  1,000 mg Oral BID WC Naida SleightJames M Owens, PA-C   1,000 mg at 05/31/14 1035  . methocarbamol (ROBAXIN) tablet 500 mg  500 mg Oral Q6H PRN Naida SleightJames M Owens, PA-C   500 mg at 05/31/14 [id number]   Or  . methocarbamol (ROBAXIN) 500 mg in dextrose 5 % 50 mL IVPB  500 mg Intravenous Q6H PRN Naida SleightJames M Owens, PA-C      . metoCLOPramide [facility](REGLAN)  tablet 5-10 mg  5-10 mg Oral Q8H PRN Naida Sleight, PA-C       Or  . metoCLOPramide (REGLAN) injection 5-10 mg  5-10 mg Intravenous Q8H PRN Naida Sleight, PA-C      . ondansetron [facility]) tablet 4 mg  4 mg Oral Q6H PRN Naida Sleight, PA-C       Or  . ondansetron [facility]) injection 4 mg  4 mg Intravenous Q6H PRN Naida Sleight, PA-C      . oxyCODONE (Oxy IR/ROXICODONE) immediate release tablet 5-10 mg  5-10 mg Oral Q4H PRN Naida Sleight, PA-C   10 mg at 05/31/14 0354  . OxyCODONE (OXYCONTIN) 12 hr tablet 10 mg  10 mg Oral Q12H Eldred Manges, MD   10 mg at 05/31/14 1035   Review Of Systems: 12 point ROS negative except as noted above in HPI.  Physical Exam: Filed Vitals:   05/31/14 1539  BP: 124/65  Pulse: 92  Temp: 99.4 F (37.4 C)  Resp: 16    General: alert, cooperative, moderately obese and morbidly obese HEENT: PERRLA and extra ocular movement intact Heart: S1, S2 normal, no murmur, rub or gallop, regular rate and rhythm Lungs: clear to auscultation, no wheezes or rales and unlabored breathing Abdomen: abdomen is soft without significant tenderness, masses, organomegaly or guarding Extremities: L foot post surgical wound-bandaged. minimal tenderness Skin:no rashes Neurology: normal without focal findings  Labs and Imaging: Lab Results  Component Value Date/Time   NA 137 05/22/2014 08:19 AM   K 3.9 05/22/2014 08:19 AM   CL 98 05/22/2014 08:19 AM   CO2 28 05/22/2014 08:19 AM   BUN 19 05/22/2014 08:19 AM   CREATININE 0.99 05/22/2014 08:19 AM   GLUCOSE 150* 05/22/2014 08:19 AM   Lab Results  Component Value Date   WBC  9.6 05/31/2014   HGB 13.2 05/31/2014   HCT 38.2* 05/31/2014   MCV 88.4 05/31/2014   PLT 191 05/31/2014    Dg Chest Port 1 View  05/31/2014   CLINICAL DATA:  Hypoxia.  EXAM: PORTABLE CHEST - 1 VIEW  COMPARISON:  05/22/2014.  FINDINGS: Poor inspiration. Grossly normal sized heart. Clear lungs with normal vascularity. Lower thoracic spine degenerative changes.  IMPRESSION: No acute abnormality.   Electronically Signed   By: Beckie Salts M.D.   On: 05/31/2014 15:40           Doree Albee MD  Pager: [phone]

## 2014-05-31 NOTE — Progress Notes (Signed)
Subjective: RN called and said that patient's O2 sats have been dropping into the low 90's.  Patient reports also high 80's.  C/o productive cough with yellow sputum.  Question some dyspnea when up.  Hx of sleep apnea.  No cpap.  Denies chest pain.    Objective: Vital signs in last 24 hours: Temp:  [99.2 F (37.3 C)-100 F (37.8 C)] 99.2 F (37.3 C) (05/06 0641) Pulse Rate:  [98-109] 98 (05/06 0641) Resp:  [18-93] 18 (05/06 0641) BP: (136-148)/(61-69) 136/67 mmHg (05/06 0641) SpO2:  [93 %-94 %] 93 % (05/06 0641)  Intake/Output from previous day: 05/05 0701 - 05/06 0700 In: 960 [P.O.:960] Out: 3925 [Urine:3925] Intake/Output this shift: Total I/O In: -  Out: 250 [Urine:250]  No results for input(s): HGB in the last 72 hours. No results for input(s): WBC, RBC, HCT, PLT in the last 72 hours. No results for input(s): NA, K, CL, CO2, BUN, CREATININE, GLUCOSE, CALCIUM in the last 72 hours. No results for input(s): LABPT, INR in the last 72 hours.  Exam: alert and oriented.  NAD.  O2 sats low 90's while I was in room.  Splint intact.    Assessment/Plan: Spoke with hospitalist and they will evaluate patient.   Ordered chest xray, ekg,CBC and CMET.     [name] [name] M 05/31/2014, 3:40 PM

## 2014-06-01 DIAGNOSIS — J9601 Acute respiratory failure with hypoxia: Secondary | ICD-10-CM | POA: Diagnosis not present

## 2014-06-01 DIAGNOSIS — Z981 Arthrodesis status: Secondary | ICD-10-CM

## 2014-06-01 DIAGNOSIS — R5082 Postprocedural fever: Secondary | ICD-10-CM | POA: Diagnosis not present

## 2014-06-01 DIAGNOSIS — M19072 Primary osteoarthritis, left ankle and foot: Secondary | ICD-10-CM | POA: Diagnosis not present

## 2014-06-01 LAB — GLUCOSE, CAPILLARY
GLUCOSE-CAPILLARY: 96 mg/dL (ref 70–99)
GLUCOSE-CAPILLARY: 145 mg/dL — AB (ref 70–99)

## 2014-06-01 MED ORDER — LEVOFLOXACIN 750 MG PO TABS: 750.0000 mg | ORAL_TABLET | Freq: Every day | ORAL | Status: DC

## 2014-06-01 MED ORDER — LEVOFLOXACIN 500 MG PO TABS
750.0000 mg | ORAL_TABLET | Freq: Every day | ORAL | Status: DC
  Administered 2014-06-01: 750 mg via ORAL
  Filled 2014-06-01: qty 2

## 2014-06-01 NOTE — Progress Notes (Signed)
Patient is stable satting in the low 90s on RA.  nonlabored breathing.  Workup has been negative so far.  Per patient, hospitalist came by this morning and was ok with dc home today.  Will plan on dc home today with HHPT.  Mayra ReelN. Michael Madilynn Montante, MD [facility] [phone] 8:45 AM

## 2014-06-01 NOTE — Progress Notes (Signed)
[facility]                                                                              Patient Demographics  [name] [name], is a 54 y.o. male, DOB - 1960/12/17, DGU:[id number]  Admit date - 05/29/2014   Admitting Physician Eldred Manges, MD  Outpatient Primary MD for the patient is Rolm Gala, MD  LOS - 3   No chief complaint on file.      Brief HPI   Briefly 54 year old male with morbid obesity, OSA, type 2 DM who was admitted 5/4 for painful bilateral plantar valgus foot deformity underwent left triple arthrodesis postoperatively patient had a fever and acute respiratory failure with hypoxia and medicine was consulted. Patient noted to have been admitted on May 4 for painful bilateral plantar valgus foot deformity. Status post arthrodesis. Per report, patient has had low-grade temps up to 100.6 over the past 24 hours. Patient also with new reading of's hypoxia with O2 sats into the upper 80s on room air.  At the time of examination by the consulting physician, patient had denied any systemic symptoms of chest pain, shortness of breath, nausea or vomiting or abdominal pain.  vitals at time of consult is time, temp 99.4, heart rate 90s to 100s, respirations intense,urrent vitals at temperature of 99.4, heart rate in the 90s to 100s, respirations 19, 114/65, subsequently was noted to be satting 90% on room air.  90s on room air. Stat labs ordered including CBC and CMP. White count 9.6, hemoglobin 13.2. CMP still pending. Chest x-ray without acute infiltrate. EKG NSR.    Assessment & Plan    Active Problems:   Status post arthrodesis - Per orthopedics    Postoperative fever,  Acute respiratory failure with hypoxia; likely due to atelectasis. Patient has underlying history of obesity hypoventilation with obstructive sleep apnea, on CPAP, noncompliant - Currently patient is back to baseline, at the time of my examination this morning O2 sats 96-98% on 2 L. Removed  the oxygen and he remained 98% on room air - Patient on examination has no wheezing or rhonchi, had a low-grade temp 99, 100.2 at 7 PM yesterday. Chest x-ray reviewed, no infiltrates.  - CT angiogram of the chest was negative for pulmonary embolism, showed small right upper lobe lung nodules with some associated groundglass opacity, may be infectious/inflammatory. Patient reports that he does not smoke. Repeat CT in 12 months for follow-up of the lung nodules, explained in detail it to the patient - Recommended patient to continue incentive spirometry, he does not need any inhalers or steroids, has no wheezing. Given that he had low-grade fever overnight, will give prescription for Levaquin X 5 days. - Strongly recommended patient to be compliant with CPAP at home, he will go to his pulmonologist for repeat fitting. Patient reports that CPAP becomes loose at night.  - Also recommended to cut down narcotics as tolerated  Diabetes mellitus - Continue home regimen of insulin  Hypertension - Continue home regimen of antihypertensives   Code Status: Full code   Family Communication: Discussed in detail with the patient, all imaging results, lab results explained  to the patient    Disposition Plan:Okay from medical standpoint TO DISCHARGE  Time Spent in minutes   25minutes  Procedures  CT angiogram of the chest    DVT Prophylaxis SCD's  Medications  Scheduled Meds: . allopurinol  300 mg Oral Q1200  . aspirin EC  325 mg Oral Daily  . atorvastatin  10 mg Oral Daily  . carvedilol  6.25 mg Oral BID WC  . docusate sodium  100 mg Oral BID  . hydrochlorothiazide  25 mg Oral Daily  . insulin glargine  25 Units Subcutaneous Daily  . insulin glargine  65 Units Subcutaneous QHS  . losartan  100 mg Oral Daily  . OxyCODONE  10 mg Oral Q12H   Continuous Infusions: . lactated ringers 50 mL/hr at 05/29/14 1128   PRN Meds:.acetaminophen **OR** acetaminophen, HYDROmorphone (DILAUDID) injection,  methocarbamol **OR** methocarbamol (ROBAXIN)  IV, metoCLOPramide **OR** metoCLOPramide (REGLAN) injection, ondansetron **OR** ondansetron (ZOFRAN) IV, oxyCODONE   Antibiotics   Anti-infectives    Start     Dose/Rate Route Frequency Ordered Stop   05/29/14 1915  vancomycin (VANCOCIN) IVPB 1000 mg/200 mL premix     1,000 mg 200 mL/hr over 60 Minutes Intravenous Every 12 hours 05/29/14 1851 05/29/14 2119   05/29/14 0600  clindamycin (CLEOCIN) IVPB 900 mg     900 mg 100 mL/hr over 30 Minutes Intravenous On call to O.R. 05/28/14 1306 05/29/14 1315        Subjective:   [name] [name] was seen and examined today.  Patient denies dizziness, chest pain, shortness of breath, abdominal pain, N/V/D/C, new weakness, numbess, tingling. No acute events overnight. O2 sat Was 96% On 2 L, O2 sats remained in 90s after O2 was taken off, feels back to his baseline   Objective:   Blood pressure 136/74, pulse 81, temperature 99 F (37.2 C), temperature source Oral, resp. rate 16, height 6\' 2"  (1.88 m), weight 156.945 kg (346 lb), SpO2 98 %.  Wt Readings from Last 3 Encounters:  05/29/14 156.945 kg (346 lb)  03/05/13 157.852 kg (348 lb)     Intake/Output Summary (Last 24 hours) at 06/01/14 1001 Last data filed at 06/01/14 [id number]  Gross per 24 hour  Intake      0 ml  Output    700 ml  Net   -700 ml    Exam  General: Alert and oriented x 3, NAD  HEENT:  PERRLA, EOMI, Anicteic Sclera, mucous membranes moist.   Neck: Supple, no JVD, no masses  CVS: S1 S2 auscultated, no rubs, murmurs or gallops. Regular rate and rhythm.  Respiratory: Clear to auscultation bilaterally, no wheezing, rales or rhonchi  Abdomen: Soft, nontender, nondistended, + bowel sounds  Ext: no cyanosis clubbing or edema, left lower extremity in dressing   Neuro: AAOx3, Cr N's II- XII. Strength 5/5 upper and lower extremities bilaterally  Skin: No rashes  Psych: Normal affect and demeanor, alert and oriented x3     Data Review   Micro Results No results found for this or any previous visit (from the past 240 hour(s)).  Radiology Reports Dg Chest 2 View  05/22/2014   CLINICAL DATA:  Preop. Planovalgus deformity of foot, acquired. Planned triple arthrodesis. Personal history of hypertension, heart murmur, and diabetes.  EXAM: CHEST  2 VIEW  COMPARISON:  01/01/2014  FINDINGS: The cardiomediastinal silhouette is within normal limits. The lungs are mildly hypoinflated with minimal linear opacity in the left lung base most consistent with atelectasis. No confluent airspace opacity,  pulmonary edema, pleural effusion, or pneumothorax is identified. Thoracolumbar spondylosis is noted.  IMPRESSION: No evidence of acute airspace disease. Minimal left basilar atelectasis.   Electronically Signed   By: Sebastian AcheAllen  Grady   On: 05/22/2014 09:09   Ct Angio Chest Pe W/cm &/or Wo Cm  06/01/2014   CLINICAL DATA:  Acute respiratory failure with hypoxia.  EXAM: CT ANGIOGRAPHY CHEST WITH CONTRAST  TECHNIQUE: Multidetector CT imaging of the chest was performed using the standard protocol during bolus administration of intravenous contrast. Multiplanar CT image reconstructions and MIPs were obtained to evaluate the vascular anatomy.  CONTRAST:  75mL OMNIPAQUE IOHEXOL 350 MG/ML SOLN  COMPARISON:  Chest radiograph earlier today  FINDINGS: Pulmonary arterial opacification is suboptimal, and there is also mild-to-moderate motion artifact. No lobar or more central emboli are identified. Segmental and subsegmental branches are not well evaluated.  Small mediastinal lymph nodes measure up to 9 mm in short axis, likely reactive. No enlarged hilar lymph nodes are identified. Heart is normal in size. There is no pleural or pericardial effusion.  Evaluation of the lung parenchyma is limited by respiratory motion artifact. A 6 mm nodule is present in the right lung apex (series 6, image 7). There is also suggestion of additional small nodular and  patchy ground-glass opacities slightly more inferiorly in the right upper lobe. Opacities in the lower lobes and lingula are favored to be due to respiratory motion, however underlying infectious or inflammatory processes not excluded.  The visualized portion the upper abdomen is unremarkable. No acute osseous abnormality is seen.  Review of the MIP images confirms the above findings.  IMPRESSION: 1. Examination partially limited by suboptimal pulmonary arterial opacification and moderate motion artifact. No evidence of central pulmonary emboli. 2. Small right upper lobe lung nodules, possibly with some associated ground-glass opacity. These may be infectious/inflammatory. If the patient is at high risk for bronchogenic carcinoma, follow-up chest CT at 6-12 months is recommended. If the patient is at low risk for bronchogenic carcinoma, follow-up chest CT at 12 months is recommended. This recommendation follows the consensus statement: Guidelines for Management of Small Pulmonary Nodules Detected on CT Scans: A Statement from the Fleischner Society as published in Radiology 2005;237:395-400.   Electronically Signed   By: Sebastian AcheAllen  Grady   On: 06/01/2014 00:01   Dg Chest Port 1 View  05/31/2014   CLINICAL DATA:  Hypoxia.  EXAM: PORTABLE CHEST - 1 VIEW  COMPARISON:  05/22/2014.  FINDINGS: Poor inspiration. Grossly normal sized heart. Clear lungs with normal vascularity. Lower thoracic spine degenerative changes.  IMPRESSION: No acute abnormality.   Electronically Signed   By: Beckie SaltsSteven  Reid M.D.   On: 05/31/2014 15:40    CBC  Recent Labs Lab 05/31/14 1606  WBC 9.6  HGB 13.2  HCT 38.2*  PLT 191  MCV 88.4  MCH 30.6  MCHC 34.6  RDW 12.6  LYMPHSABS 2.0  MONOABS 0.8  EOSABS 0.1  BASOSABS 0.0    Chemistries   Recent Labs Lab 05/31/14 1606  NA 133*  K 3.2*  CL 91*  CO2 33*  GLUCOSE 182*  BUN 13  CREATININE 1.03  CALCIUM 8.8*  AST 22  ALT 23  ALKPHOS 58  BILITOT 0.5    ------------------------------------------------------------------------------------------------------------------ estimated creatinine clearance is 131.5 mL/min (by C-G formula based on Cr of 1.03). ------------------------------------------------------------------------------------------------------------------ No results for input(s): HGBA1C in the last 72 hours. ------------------------------------------------------------------------------------------------------------------ No results for input(s): CHOL, HDL, LDLCALC, TRIG, CHOLHDL, LDLDIRECT in the last 72 hours. ------------------------------------------------------------------------------------------------------------------ No results  for input(s): TSH, T4TOTAL, T3FREE, THYROIDAB in the last 72 hours.  Invalid input(s): FREET3 ------------------------------------------------------------------------------------------------------------------ No results for input(s): VITAMINB12, FOLATE, FERRITIN, TIBC, IRON, RETICCTPCT in the last 72 hours.  Coagulation profile No results for input(s): INR, PROTIME in the last 168 hours.  No results for input(s): DDIMER in the last 72 hours.  Cardiac Enzymes  Recent Labs Lab 05/31/14 1700  TROPONINI <0.03   ------------------------------------------------------------------------------------------------------------------ Invalid input(s): POCBNP   Recent Labs  05/30/14 2134 05/31/14 0644 05/31/14 1125 05/31/14 1633 05/31/14 2121 06/01/14 0632  GLUCAP 168* 156* 172* 149* 198* 96     Sharonna Vinje M.D. [facility] 06/01/2014, 10:01 AM  Pager: [phone]   Between 7am to 7pm - call Pager - [phone]  After 7pm go to [email or web address] - password [email or web address]  Call night coverage person covering after 7pm

## 2014-06-04 ENCOUNTER — Encounter (HOSPITAL_COMMUNITY): Payer: Self-pay | Admitting: Orthopaedic Surgery

## 2014-06-04 DIAGNOSIS — R918 Other nonspecific abnormal finding of lung field: Secondary | ICD-10-CM | POA: Insufficient documentation

## 2014-06-12 NOTE — Discharge Summary (Signed)
Patient ID: [name] CampbellDaniel Haberl MRN: [id number] DOB/AGE: 28-Apr-1960 54 y.o.  Admit date: 05/29/2014 Discharge date: 06/12/2014  Admission Diagnoses:  Active Problems:   Status post arthrodesis   Postoperative fever   Acute respiratory failure with hypoxia   Discharge Diagnoses:  Active Problems:   Status post arthrodesis   Postoperative fever   Acute respiratory failure with hypoxia  status post Procedure(s): Left Triple Arthrodesis, Possible Achilles Lengthening  Past Medical History  Diagnosis Date  . Hypertension   . OA (osteoarthritis) of hip     Hx: of  . Heart murmur     Hx: of as achild  . Kidney stones     Hx: of  . Inguinal hernia     Hx: of left groin  . Sleep apnea     DOESN'T WEAR CPAP - 'CLOSED IN FEELING'.  [facility]  . Diabetes mellitus without complication     type 2     Surgeries: Procedure(s): Left Triple Arthrodesis, Possible Achilles Lengthening on 05/29/2014   Consultants:    Discharged Condition: Improved  Hospital Course: [name] [name] is an 54 y.o. male who was admitted 05/29/2014 for operative treatment of foot djd, deformity. Patient failed conservative treatments (please see the history and physical for the specifics) and had severe unremitting pain that affects sleep, daily activities and work/hobbies. After pre-op clearance, the patient was taken to the operating room on 05/29/2014 and underwent  Procedure(s): Left Triple Arthrodesis, Possible Achilles Lengthening.    Patient was given perioperative antibiotics:  Anti-infectives    Start     Dose/Rate Route Frequency Ordered Stop   06/01/14 1030  levofloxacin (LEVAQUIN) tablet 750 mg  Status:  Discontinued     750 mg Oral Daily 06/01/14 1016 06/01/14 1628   06/01/14 0000  levofloxacin (LEVAQUIN) 750 MG tablet     750 mg Oral Daily 06/01/14 1017     05/29/14 1915  vancomycin (VANCOCIN) IVPB 1000 mg/200 mL premix     1,000 mg 200 mL/hr over 60 Minutes Intravenous Every 12 hours  05/29/14 1851 05/29/14 2119   05/29/14 0600  clindamycin (CLEOCIN) IVPB 900 mg     900 mg 100 mL/hr over 30 Minutes Intravenous On call to O.R. 05/28/14 1306 05/29/14 1315       Patient was given sequential compression devices and early ambulation to prevent DVT.   Patient benefited maximally from hospital stay and there were no complications. At the time of discharge, the patient was urinating/moving their bowels without difficulty, tolerating a regular diet, pain is controlled with oral pain medications and they have been cleared by PT/OT.   Recent vital signs: No data found.    Recent laboratory studies: No results for input(s): WBC, HGB, HCT, PLT, NA, K, CL, CO2, BUN, CREATININE, GLUCOSE, INR, CALCIUM in the last 72 hours.  Invalid input(s): PT, 2   Discharge Medications:     Medication List    STOP taking these medications        Cinnamon 500 MG Tabs     naproxen sodium 220 MG tablet  Commonly known as:  ANAPROX     oxyCODONE-acetaminophen 5-325 MG per tablet  Commonly known as:  ROXICET     Turmeric 500 MG Caps      TAKE these medications        allopurinol 300 MG tablet  Commonly known as:  ZYLOPRIM  Take 300 mg by mouth daily at 12 noon.     ascorbic acid  500 MG tablet  Commonly known as:  VITAMIN C  Take 500 mg by mouth daily.     aspirin EC 325 MG tablet  Take 1 tablet (325 mg total) by mouth daily.     atorvastatin 10 MG tablet  Commonly known as:  LIPITOR  Take 10 mg by mouth daily.     b complex vitamins tablet  Take 1 tablet by mouth daily.     carvedilol 6.25 MG tablet  Commonly known as:  COREG  Take 6.25 mg by mouth 2 (two) times daily with a meal.     glimepiride 4 MG tablet  Commonly known as:  AMARYL  Take 4 mg by mouth 2 (two) times daily with a meal.     hydrochlorothiazide 25 MG tablet  Commonly known as:  HYDRODIURIL  Take 25 mg by mouth daily.     HYDROmorphone 2 MG tablet  Commonly known as:  DILAUDID  Take 1 tablet (2 mg  total) by mouth every 6 (six) hours as needed for severe pain.     insulin glargine 100 UNIT/ML injection  Commonly known as:  LANTUS  Inject 25-75 Units into the skin 2 (two) times daily. 25units in the morning and 65 units in the evening     levofloxacin 750 MG tablet  Commonly known as:  LEVAQUIN  Take 1 tablet (750 mg total) by mouth daily.     losartan 100 MG tablet  Commonly known as:  COZAAR  Take 100 mg by mouth daily.     metFORMIN 1000 MG tablet  Commonly known as:  GLUCOPHAGE  Take 1,000 mg by mouth 2 (two) times daily with a meal.     methocarbamol 750 MG tablet  Commonly known as:  ROBAXIN-750  Take 1 tablet (750 mg total) by mouth every 6 (six) hours as needed for muscle spasms.     Omega-3 Fish Oil 300 MG Caps  Take 600 mg by mouth daily.     Vitamin D 2000 UNITS Caps  Take 2,000 Units by mouth daily.        Diagnostic Studies: Dg Chest 2 View  05/22/2014   CLINICAL DATA:  Preop. Planovalgus deformity of foot, acquired. Planned triple arthrodesis. Personal history of hypertension, heart murmur, and diabetes.  EXAM: CHEST  2 VIEW  COMPARISON:  01/01/2014  FINDINGS: The cardiomediastinal silhouette is within normal limits. The lungs are mildly hypoinflated with minimal linear opacity in the left lung base most consistent with atelectasis. No confluent airspace opacity, pulmonary edema, pleural effusion, or pneumothorax is identified. Thoracolumbar spondylosis is noted.  IMPRESSION: No evidence of acute airspace disease. Minimal left basilar atelectasis.   Electronically Signed   By: Sebastian AcheAllen  Grady   On: 05/22/2014 09:09   Ct Angio Chest Pe W/cm &/or Wo Cm  06/01/2014   CLINICAL DATA:  Acute respiratory failure with hypoxia.  EXAM: CT ANGIOGRAPHY CHEST WITH CONTRAST  TECHNIQUE: Multidetector CT imaging of the chest was performed using the standard protocol during bolus administration of intravenous contrast. Multiplanar CT image reconstructions and MIPs were obtained to  evaluate the vascular anatomy.  CONTRAST:  75mL OMNIPAQUE IOHEXOL 350 MG/ML SOLN  COMPARISON:  Chest radiograph earlier today  FINDINGS: Pulmonary arterial opacification is suboptimal, and there is also mild-to-moderate motion artifact. No lobar or more central emboli are identified. Segmental and subsegmental branches are not well evaluated.  Small mediastinal lymph nodes measure up to 9 mm in short axis, likely reactive. No enlarged hilar lymph nodes are  identified. Heart is normal in size. There is no pleural or pericardial effusion.  Evaluation of the lung parenchyma is limited by respiratory motion artifact. A 6 mm nodule is present in the right lung apex (series 6, image 7). There is also suggestion of additional small nodular and patchy ground-glass opacities slightly more inferiorly in the right upper lobe. Opacities in the lower lobes and lingula are favored to be due to respiratory motion, however underlying infectious or inflammatory processes not excluded.  The visualized portion the upper abdomen is unremarkable. No acute osseous abnormality is seen.  Review of the MIP images confirms the above findings.  IMPRESSION: 1. Examination partially limited by suboptimal pulmonary arterial opacification and moderate motion artifact. No evidence of central pulmonary emboli. 2. Small right upper lobe lung nodules, possibly with some associated ground-glass opacity. These may be infectious/inflammatory. If the patient is at high risk for bronchogenic carcinoma, follow-up chest CT at 6-12 months is recommended. If the patient is at low risk for bronchogenic carcinoma, follow-up chest CT at 12 months is recommended. This recommendation follows the consensus statement: Guidelines for Management of Small Pulmonary Nodules Detected on CT Scans: A Statement from the Fleischner Society as published in Radiology 2005;237:395-400.   Electronically Signed   By: Sebastian Ache   On: 06/01/2014 00:01   Dg Chest Port 1  View  05/31/2014   CLINICAL DATA:  Hypoxia.  EXAM: PORTABLE CHEST - 1 VIEW  COMPARISON:  05/22/2014.  FINDINGS: Poor inspiration. Grossly normal sized heart. Clear lungs with normal vascularity. Lower thoracic spine degenerative changes.  IMPRESSION: No acute abnormality.   Electronically Signed   By: Beckie Salts M.D.   On: 05/31/2014 15:40   Dg Foot Complete Left  06/03/2014   CLINICAL DATA:  Arthrodesis.  EXAM: DG C-ARM 61-120 MIN; LEFT FOOT - COMPLETE 3+ VIEW  TECHNIQUE: Fluoroscopic spot images obtained of the left foot .  CONTRAST:  None.  FLUOROSCOPY TIME:  Fluoroscopy Time (in minutes and seconds): 0 min 25 seconds.  Number of Acquired Images:  3  COMPARISON:  None.  FINDINGS: Surgical screws are noted in the left proximal tarsals. Surgical screws are intact. Anatomic alignment appears to be present.  IMPRESSION: Postsurgical changes left foot.  Surgical screws intact.   Electronically Signed   By: Maisie Fus  Register   On: 06/03/2014 09:27   Dg C-arm 1-60 Min  06/03/2014   CLINICAL DATA:  Arthrodesis.  EXAM: DG C-ARM 61-120 MIN; LEFT FOOT - COMPLETE 3+ VIEW  TECHNIQUE: Fluoroscopic spot images obtained of the left foot .  CONTRAST:  None.  FLUOROSCOPY TIME:  Fluoroscopy Time (in minutes and seconds): 0 min 25 seconds.  Number of Acquired Images:  3  COMPARISON:  None.  FINDINGS: Surgical screws are noted in the left proximal tarsals. Surgical screws are intact. Anatomic alignment appears to be present.  IMPRESSION: Postsurgical changes left foot.  Surgical screws intact.   Electronically Signed   By: Maisie Fus  Register   On: 06/03/2014 09:27        Discharge Instructions    Call MD / Call 911    Complete by:  As directed   If you experience chest pain or shortness of breath, CALL 911 and be transported to the hospital emergency room.  If you develope a fever above 101 F, pus (white drainage) or increased drainage or redness at the wound, or calf pain, call your surgeon's office.     Call MD / Call  911  Complete by:  As directed   If you experience chest pain or shortness of breath, CALL 911 and be transported to the hospital emergency room.  If you develope a fever above 101.5 F, pus (white drainage) or increased drainage or redness at the wound, or calf pain, call your surgeon's office.     Constipation Prevention    Complete by:  As directed   Drink plenty of fluids.  Prune juice may be helpful.  You may use a stool softener, such as Colace (over the counter) 100 mg twice a day.  Use MiraLax (over the counter) for constipation as needed.     Constipation Prevention    Complete by:  As directed   Drink plenty of fluids.  Prune juice may be helpful.  You may use a stool softener, such as Colace (over the counter) 100 mg twice a day.  Use MiraLax (over the counter) for constipation as needed.     Diet - low sodium heart healthy    Complete by:  As directed      Diet - low sodium heart healthy    Complete by:  As directed      Diet general    Complete by:  As directed      Discharge instructions    Complete by:  As directed   Strict non-weightbearing left foot.  Do not get splint/dressing wet.  Elevate foot above heart level as much as possible.  Do not shower.     Driving restrictions    Complete by:  As directed   No driving     Driving restrictions    Complete by:  As directed   No driving while taking narcotic pain meds.     Increase activity slowly as tolerated    Complete by:  As directed      Increase activity slowly as tolerated    Complete by:  As directed      Lifting restrictions    Complete by:  As directed   No lifting           Follow-up Information    Schedule an appointment as soon as possible for a visit with Eldred Manges, MD.   Specialty:  Orthopedic Surgery   Why:  need return office visit one week.    Contact information:   [address] [place] [address] [phone]       Discharge Plan:  discharge to home  Disposition:      Signed: Naida Sleight  06/12/2014, 4:44 PM

## 2014-06-14 ENCOUNTER — Encounter (HOSPITAL_COMMUNITY): Payer: Self-pay | Admitting: Orthopaedic Surgery

## 2014-11-26 ENCOUNTER — Other Ambulatory Visit: Payer: Self-pay | Admitting: Family Medicine

## 2014-11-26 DIAGNOSIS — R918 Other nonspecific abnormal finding of lung field: Secondary | ICD-10-CM

## 2014-12-02 ENCOUNTER — Ambulatory Visit
Admission: RE | Admit: 2014-12-02 | Discharge: 2014-12-02 | Disposition: A | Discharge status: Home / Self Care | Payer: BLUE CROSS/BLUE SHIELD | Source: Ambulatory Visit | Attending: Family Medicine | Admitting: Family Medicine

## 2014-12-02 DIAGNOSIS — R918 Other nonspecific abnormal finding of lung field: Secondary | ICD-10-CM

## 2014-12-02 DIAGNOSIS — K760 Fatty (change of) liver, not elsewhere classified: Secondary | ICD-10-CM | POA: Diagnosis not present

## 2014-12-02 DIAGNOSIS — R911 Solitary pulmonary nodule: Secondary | ICD-10-CM | POA: Diagnosis present

## 2014-12-02 MED ORDER — IOHEXOL 300 MG/ML  SOLN
75.0000 mL | Freq: Once | INTRAMUSCULAR | Status: AC | PRN
  Administered 2014-12-02: 75 mL via INTRAVENOUS

## 2014-12-17 ENCOUNTER — Other Ambulatory Visit (HOSPITAL_COMMUNITY): Payer: Self-pay | Admitting: Orthopaedic Surgery

## 2015-01-02 ENCOUNTER — Encounter (HOSPITAL_COMMUNITY)
Admission: RE | Admit: 2015-01-02 | Discharge: 2015-01-02 | Disposition: A | Discharge status: Home / Self Care | Payer: BLUE CROSS/BLUE SHIELD | Source: Ambulatory Visit | Attending: Orthopaedic Surgery | Admitting: Orthopaedic Surgery

## 2015-01-02 ENCOUNTER — Encounter (HOSPITAL_COMMUNITY): Payer: Self-pay

## 2015-01-02 DIAGNOSIS — M19071 Primary osteoarthritis, right ankle and foot: Secondary | ICD-10-CM | POA: Diagnosis not present

## 2015-01-02 DIAGNOSIS — M21071 Valgus deformity, not elsewhere classified, right ankle: Secondary | ICD-10-CM | POA: Insufficient documentation

## 2015-01-02 DIAGNOSIS — E119 Type 2 diabetes mellitus without complications: Secondary | ICD-10-CM | POA: Insufficient documentation

## 2015-01-02 DIAGNOSIS — Z01812 Encounter for preprocedural laboratory examination: Secondary | ICD-10-CM | POA: Diagnosis not present

## 2015-01-02 DIAGNOSIS — Z87891 Personal history of nicotine dependence: Secondary | ICD-10-CM | POA: Insufficient documentation

## 2015-01-02 DIAGNOSIS — I10 Essential (primary) hypertension: Secondary | ICD-10-CM | POA: Diagnosis not present

## 2015-01-02 DIAGNOSIS — Z01818 Encounter for other preprocedural examination: Secondary | ICD-10-CM | POA: Insufficient documentation

## 2015-01-02 DIAGNOSIS — G4733 Obstructive sleep apnea (adult) (pediatric): Secondary | ICD-10-CM | POA: Insufficient documentation

## 2015-01-02 DIAGNOSIS — Z794 Long term (current) use of insulin: Secondary | ICD-10-CM | POA: Insufficient documentation

## 2015-01-02 DIAGNOSIS — Z7982 Long term (current) use of aspirin: Secondary | ICD-10-CM | POA: Insufficient documentation

## 2015-01-02 DIAGNOSIS — Z79899 Other long term (current) drug therapy: Secondary | ICD-10-CM | POA: Insufficient documentation

## 2015-01-02 LAB — COMPREHENSIVE METABOLIC PANEL
ALBUMIN: 4.2 g/dL (ref 3.5–5.0)
ALK PHOS: 95 U/L (ref 38–126)
ALT: 26 U/L (ref 17–63)
AST: 20 U/L (ref 15–41)
Anion gap: 10 (ref 5–15)
BUN: 21 mg/dL — ABNORMAL HIGH (ref 6–20)
CALCIUM: 9.8 mg/dL (ref 8.9–10.3)
CO2: 29 mmol/L (ref 22–32)
CREATININE: 1.06 mg/dL (ref 0.61–1.24)
Chloride: 99 mmol/L — ABNORMAL LOW (ref 101–111)
GFR calc Af Amer: >60 mL/min (ref >60)
GFR calc non Af Amer: >60 mL/min (ref >60)
GLUCOSE: 253 mg/dL — AB (ref 65–99)
Potassium: 4.2 mmol/L (ref 3.5–5.1)
SODIUM: 138 mmol/L (ref 135–145)
Total Bilirubin: 0.3 mg/dL (ref 0.3–1.2)
Total Protein: 7.4 g/dL (ref 6.5–8.1)

## 2015-01-02 LAB — CBC
HCT: 44.8 % (ref 39.0–52.0)
HEMOGLOBIN: 15 g/dL (ref 13.0–17.0)
MCH: 29.9 pg (ref 26.0–34.0)
MCHC: 33.5 g/dL (ref 30.0–36.0)
MCV: 89.2 fL (ref 78.0–100.0)
Platelets: 265 10*3/uL (ref 150–400)
RBC: 5.02 MIL/uL (ref 4.22–5.81)
RDW: 13.2 % (ref 11.5–15.5)
WBC: 13.5 10*3/uL — ABNORMAL HIGH (ref 4.0–10.5)

## 2015-01-02 LAB — PROTIME-INR
INR: 1.03 (ref 0.00–1.49)
Prothrombin Time: 13.7 seconds (ref 11.6–15.2)

## 2015-01-02 LAB — URINALYSIS, ROUTINE W REFLEX MICROSCOPIC
Bilirubin Urine: NEGATIVE
Ketones, ur: NEGATIVE mg/dL
Leukocytes, UA: NEGATIVE
Nitrite: NEGATIVE
Protein, ur: NEGATIVE mg/dL
SPECIFIC GRAVITY, URINE: 1.021 (ref 1.005–1.030)
pH: 5.5 (ref 5.0–8.0)

## 2015-01-02 LAB — URINE MICROSCOPIC-ADD ON

## 2015-01-02 LAB — GLUCOSE, CAPILLARY: GLUCOSE-CAPILLARY: 332 mg/dL — AB (ref 65–99)

## 2015-01-02 NOTE — Progress Notes (Signed)
Pt. Reports that his O2 sat. Dropped post op after last surgery.  Pt. Doesn't use CPAP at home because its "uncomfortable". Pt. Told to bring mask by Dr. Ophelia CharterYates when he presents for surgery.

## 2015-01-02 NOTE — Progress Notes (Signed)
CBG ^ today, pt. Remarks that as soon as he enters the hosp. His BP & CBG rises. Faxing request to Dr. Lisabeth DevoidBallan for last HgbA1c

## 2015-01-02 NOTE — Pre-Procedure Instructions (Signed)
[name] CampbellDaniel [name]  01/02/2015      [facility] [address]- [place], [place] - [address] [place] [address] Phone: [phone] Fax: [phone]    Your procedure is scheduled on 01/10/2015.  [facility] at 5:30 A.M.  Call this number if you have problems the morning of surgery:  [phone]   Remember:  Do not eat food or drink liquids after midnight. On Thursday   Take these medicines the morning of surgery with A SIP OF WATER : COREG   & see special instructions regarding insulin    Do not wear jewelry   Do not wear lotions, powders, or perfumes.  You may wear deodorant.     Men may shave face and neck.   Do not bring valuables to the hospital.   [facility] is not responsible for any belongings or valuables.  Contacts, dentures or bridgework may not be worn into surgery.  Leave your suitcase in the car.  After surgery it may be brought to your room.  For patients admitted to the hospital, discharge time will be determined by your treatment team.  Patients discharged the day of surgery will not be allowed to drive home.   Name and phone number of your driver:   /w wife      Special instructions: How to Manage Your Diabetes Before Surgery   Why is it important to control my blood sugar before and after surgery?   Improving blood sugar levels before and after surgery helps healing and can limit problems.  A way of improving blood sugar control is eating a healthy diet by:  - Eating less sugar and carbohydrates  - Increasing activity/exercise  - Talk with your doctor about reaching your blood sugar goals  High blood sugars (greater than 180 mg/dL) can raise your risk of infections and slow down your recovery so you will need to focus on controlling your diabetes during the weeks before surgery.  Make sure that the doctor who takes care of your diabetes knows about your planned surgery including the date  and location.  How do I manage my blood sugars before surgery?   Check your blood sugar at least 4 times a day, 2 days before surgery to make sure that they are not too high or low.   Check your blood sugar the morning of your surgery when you wake up and every 2               hours until you get to the Short-Stay unit.  If your blood sugar is less than 70 mg/dL, you will need to treat for low blood sugar by:  Treat a low blood sugar (less than 70 mg/dL) with 1/2 cup of clear juice (cranberry or apple), 4 glucose tablets, OR glucose gel.  Recheck blood sugar in 15 minutes after treatment (to make sure it is greater than 70 mg/dL).  If blood sugar is not greater than 70 mg/dL on re-check, call [phone] for further instructions.   Report your blood sugar to the Short-Stay nurse when you get to Short-Stay.  References:  [facility], 2007 "How to Manage your Diabetes Before and After Surgery".  What do I do about my diabetes medications?   Do not take oral diabetes medicines (pills) the morning of surgery.    THE NIGHT BEFORE SURGERY, take  60 units of Lantus Insulin.    THE MORNING OF SURGERY, take  12- 17 units of  LANTUS Insulin.                    Special Instructions: [place] - Preparing for Surgery  Before surgery, you can play an important role.  Because skin is not sterile, your skin needs to be as free of germs as possible.  You can reduce the number of germs on you skin by washing with CHG (chlorahexidine gluconate) soap before surgery.  CHG is an antiseptic cleaner which kills germs and bonds with the skin to continue killing germs even after washing.  Please DO NOT use if you have an allergy to CHG or antibacterial soaps.  If your skin becomes reddened/irritated stop using the CHG and inform your nurse when you arrive at Short Stay.  Do not shave (including legs and underarms) for at least 48 hours prior to the first CHG  shower.  You may shave your face.  Please follow these instructions carefully:   1.  Shower with CHG Soap the night before surgery and the  morning of Surgery.  2.  If you choose to wash your hair, wash your hair first as usual with your  normal shampoo.  3.  After you shampoo, rinse your hair and body thoroughly to remove the  Shampoo.  4.  Use CHG as you would any other liquid soap.  You can apply chg directly to the skin and wash gently with scrungie or a clean washcloth.  5.  Apply the CHG Soap to your body ONLY FROM THE NECK DOWN.    Do not use on open wounds or open sores.  Avoid contact with your eyes, ears, mouth and genitals (private parts).  Wash genitals (private parts)   with your normal soap.  6.  Wash thoroughly, paying special attention to the area where your surgery will be performed.  7.  Thoroughly rinse your body with warm water from the neck down.  8.  DO NOT shower/wash with your normal soap after using and rinsing off   the CHG Soap.  9.  Pat yourself dry with a clean towel.            10.  Wear clean pajamas.            11.  Place clean sheets on your bed the night of your first shower and do not sleep with pets.  Day of Surgery  Do not apply any lotions/deodorants the morning of surgery.  Please wear clean clothes to the hospital/surgery center.  Please read over the following fact sheets that you were given. Pain Booklet, Coughing and Deep Breathing and Surgical Site Infection Prevention

## 2015-01-03 NOTE — Progress Notes (Signed)
Anesthesia Chart Review:  Pt is 54 year old male scheduled for R triple arthrodesis, R achilles lengthening, L foot screw removal on 01/10/2015 with Dr. Ophelia CharterYates.   PMH includes:  HTN, DM, OSA (does not use CPAP), heart murmur (as a child). Former smoker. BMI 45.5. S/p L triple arthrodesis 05/29/14. S/p L THA 03/02/13.   Medications include: ASA, lipitor, carvedilol, glimepiride, hctz, lantus, losartan, metformin.   Preoperative labs reviewed.  Glucose 253. Most recent hgbA1c was 7.3 on 10/07/14.   Chest CT 12/02/14: No acute intra thoracic process or finding. Resolved right upper lobe nodular airspace opacities compatible with prior infectious/inflammatory process or pneumonia. No residual or suspicious pulmonary nodule. Hepatic steatosis  EKG 05/31/14: Sinus rhythm with Premature atrial complexes  Called and spoke with pt. He drank power drink right before PAT. Blood glucose was 85 this morning. Advised him to be careful with his diet for next few days. He understands surgery may be cancelled if glucose >200 DOS.   If blood glucose acceptable DOS, I anticipate pt can proceed as scheduled.   [name] Mastngela Camari Wisham, FNP-BC [facility]/Anesthesiology Phone: [phone] 01/03/2015 2:02 PM

## 2015-01-09 MED ORDER — VANCOMYCIN HCL 10 G IV SOLR
1500.0000 mg | INTRAVENOUS | Status: DC
  Filled 2015-01-09: qty 1500

## 2015-01-09 NOTE — H&P (Signed)
[name] [name] is an 54 y.o. male.   The patient is a 54 year old, white male who returns for a followup visit for bilateral foot pain.  He is status post a left triple arthrodesis on May 29, 2014.  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  He is scheduled for left foot screw removal and right triple arthrodesis, and we are going to proceed.  He does have ongoing pain.  No improvement from our previous visit.  No complaints of fever or chills.  During his previous hospital admission, there were some issues with hypoxia, and the patient had a CT scan of his chest.  This showed an abnormality.  It could be a possible nodule.  He states that his primary care physician just recently repeated this CT scan, and the density that was seen on the prior study was gone.  He is not complaining of any chest pain or shortness of breath.  He has a history of sleep apnea but has not been using his CPAP because he states that it keeps him up at night.     Past Medical History  Diagnosis Date  . Hypertension   . OA (osteoarthritis) of hip     Hx: of  . Heart murmur     Hx: of as achild  . Kidney stones     Hx: of  . Inguinal hernia     Hx: of left groin  . Diabetes mellitus without complication (HCC)     type 2   . Sleep apnea     DOESN'T WEAR CPAP - 'CLOSED IN FEELING'.  [facility]rs [facility]    Past Surgical History  Procedure Laterality Date  . Colonoscopy      Hx: of  . 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 Total hip arthroplasty Left 03/02/2013    Procedure: TOTAL HIP ARTHROPLASTY ANTERIOR APPROACH;  Surgeon: Eldred MangesMark C Yates, MD;  Location: [facility] OR;  Service: Orthopedics;  Laterality: Left;  Left Total Hip Arthroplasty Direct Anterior Approach  . Foot arthrodesis, triple Left 05/29/2014  . Foot arthrodesis Left 05/29/2014    Procedure: Left Triple Arthrodesis, Possible Achilles Lengthening;  Surgeon: Eldred MangesMark C Yates, MD;  Location: [facility] OR;   Service: Orthopedics;  Laterality: Left;    Family History  Problem Relation Age of Onset  . Diabetes Mother   . Hypertension Mother   . Cancer - Other Mother   . Cancer - Lung Father   . Arthritis Sister   . Heart disease Sister    Social History:  reports that he has quit smoking. His smoking use included Cigars and Cigarettes. His smokeless tobacco use includes Snuff. He reports that he drinks about 1.2 oz of alcohol per week. He reports that he does not use illicit drugs.  Allergies:  Allergies  Allergen Reactions  . Shellfish Allergy Anaphylaxis  . Penicillins Hives    1977 Has patient had a PCN reaction causing immediate rash, facial/tongue/throat swelling, SOB or lightheadedness with hypotension: Yes Has patient had a PCN reaction causing severe rash involving mucus membranes or skin necrosis: No Has patient had a PCN reaction that required hospitalization No Has patient had a PCN reaction occurring within the last 10 years: No If all of the above answers are "NO", then may proceed with Cephalosporin use.     No prescriptions prior to admission    No results found for this or any  previous visit (from the past 48 hour(s)). No results found.  Review of Systems  Constitutional: Negative.   HENT: Negative.   Eyes: Negative.   Respiratory: Negative.   Cardiovascular: Negative.   Gastrointestinal: Negative.   Genitourinary: Negative.   Musculoskeletal: Positive for joint pain.  Skin: Negative.   Neurological: Negative.   Psychiatric/Behavioral: Negative.     There were no vitals taken for this visit. Physical Exam  Constitutional: He is oriented to person, place, and time. No distress.  HENT:  Head: Atraumatic.  Eyes: EOM are normal.  Neck: Normal range of motion.  Cardiovascular: Normal rate.   Respiratory: No respiratory distress.  GI: He exhibits no distension.  Musculoskeletal: He exhibits tenderness.  Neurological: He is alert and oriented to person,  place, and time.  Skin: Skin is warm and dry.  Psychiatric: He has a normal mood and affect.    PHYSICAL EXAMINATION:  The patient is a very pleasant, obese, white male who is alert and oriented x3 and in no acute distress.  His gait is antalgic.  Head is normocephalic atraumatic.  Extraocular movements are intact.  Oral mucosa is warm and moist.  The cervical spine is unremarkable.  No respiratory distress.  Abdomen is round and obese.  Negative log roll bilaterally.  Negative straight leg raise.  The bilateral ankles show that he has some discomfort with range of motion.  There is left foot tenderness.  Skin is warm and dry.  Bilateral calves are nontender.   ASSESSMENT:  1.  Painful hardware in the left foot after a previous fusion. 2.  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   PLAN:  We will proceed with the right triple arthrodesis and left foot screw removal that is scheduled.  The surgical procedure along with the potential risks and benefits were discussed.  All questions were answered.  The patient was encouraged to bring his CPAP to the hospital, and we will use this postoperatively.  I discussed the risk of postoperative pneumonia or breathing complications, etc.    [name] M 01/09/2015, 4:17 PM

## 2015-01-10 ENCOUNTER — Inpatient Hospital Stay (HOSPITAL_COMMUNITY)
Admission: RE | Admit: 2015-01-10 | Discharge: 2015-01-12 | DRG: 496 | Disposition: A | Discharge status: Home / Self Care | Payer: BLUE CROSS/BLUE SHIELD | Source: Ambulatory Visit | Attending: Orthopaedic Surgery | Admitting: Orthopaedic Surgery

## 2015-01-10 ENCOUNTER — Inpatient Hospital Stay (HOSPITAL_COMMUNITY): Payer: BLUE CROSS/BLUE SHIELD | Admitting: Anesthesiology

## 2015-01-10 ENCOUNTER — Inpatient Hospital Stay (HOSPITAL_COMMUNITY): Payer: BLUE CROSS/BLUE SHIELD | Admitting: Emergency Medicine

## 2015-01-10 ENCOUNTER — Encounter (HOSPITAL_COMMUNITY): Payer: Self-pay | Admitting: Anesthesiology

## 2015-01-10 ENCOUNTER — Inpatient Hospital Stay (HOSPITAL_COMMUNITY): Payer: BLUE CROSS/BLUE SHIELD

## 2015-01-10 ENCOUNTER — Encounter (HOSPITAL_COMMUNITY)
Admission: RE | Disposition: A | Discharge status: Home / Self Care | Payer: Self-pay | Source: Ambulatory Visit | Attending: Orthopaedic Surgery

## 2015-01-10 DIAGNOSIS — Z87891 Personal history of nicotine dependence: Secondary | ICD-10-CM

## 2015-01-10 DIAGNOSIS — M216X1 Other acquired deformities of right foot: Secondary | ICD-10-CM | POA: Diagnosis present

## 2015-01-10 DIAGNOSIS — Z981 Arthrodesis status: Secondary | ICD-10-CM | POA: Diagnosis not present

## 2015-01-10 DIAGNOSIS — Y929 Unspecified place or not applicable: Secondary | ICD-10-CM

## 2015-01-10 DIAGNOSIS — M19071 Primary osteoarthritis, right ankle and foot: Secondary | ICD-10-CM | POA: Diagnosis present

## 2015-01-10 DIAGNOSIS — Z7982 Long term (current) use of aspirin: Secondary | ICD-10-CM

## 2015-01-10 DIAGNOSIS — I10 Essential (primary) hypertension: Secondary | ICD-10-CM | POA: Diagnosis present

## 2015-01-10 DIAGNOSIS — Y838 Other surgical procedures as the cause of abnormal reaction of the patient, or of later complication, without mention of misadventure at the time of the procedure: Secondary | ICD-10-CM | POA: Diagnosis present

## 2015-01-10 DIAGNOSIS — M2141 Flat foot [pes planus] (acquired), right foot: Secondary | ICD-10-CM | POA: Diagnosis present

## 2015-01-10 DIAGNOSIS — Z794 Long term (current) use of insulin: Secondary | ICD-10-CM

## 2015-01-10 DIAGNOSIS — G4730 Sleep apnea, unspecified: Secondary | ICD-10-CM | POA: Diagnosis present

## 2015-01-10 DIAGNOSIS — T8484XA Pain due to internal orthopedic prosthetic devices, implants and grafts, initial encounter: Secondary | ICD-10-CM | POA: Diagnosis present

## 2015-01-10 DIAGNOSIS — Z419 Encounter for procedure for purposes other than remedying health state, unspecified: Secondary | ICD-10-CM

## 2015-01-10 DIAGNOSIS — Z472 Encounter for removal of internal fixation device: Secondary | ICD-10-CM

## 2015-01-10 DIAGNOSIS — E119 Type 2 diabetes mellitus without complications: Secondary | ICD-10-CM | POA: Diagnosis present

## 2015-01-10 HISTORY — PX: HARDWARE REMOVAL: SHX979

## 2015-01-10 HISTORY — PX: FOOT ARTHRODESIS: SHX1655

## 2015-01-10 LAB — GLUCOSE, CAPILLARY
GLUCOSE-CAPILLARY: 155 mg/dL — AB (ref 65–99)
Glucose-Capillary: 131 mg/dL — ABNORMAL HIGH (ref 65–99)
Glucose-Capillary: 109 mg/dL — ABNORMAL HIGH (ref 65–99)
Glucose-Capillary: 156 mg/dL — ABNORMAL HIGH (ref 65–99)

## 2015-01-10 SURGERY — FUSION, JOINT, FOOT
Anesthesia: General | Laterality: Right

## 2015-01-10 MED ORDER — ONDANSETRON HCL 4 MG/2ML IJ SOLN: 4.0000 mg | Freq: Four times a day (QID) | INTRAMUSCULAR | Status: DC | PRN

## 2015-01-10 MED ORDER — INSULIN GLARGINE 100 UNIT/ML ~~LOC~~ SOLN
25.0000 [IU] | Freq: Every day | SUBCUTANEOUS | Status: DC
  Administered 2015-01-11 – 2015-01-12 (×2): 25 [IU] via SUBCUTANEOUS
  Filled 2015-01-10 (×2): qty 0.25

## 2015-01-10 MED ORDER — HYDROMORPHONE HCL 1 MG/ML IJ SOLN
1.0000 mg | INTRAMUSCULAR | Status: DC | PRN
  Administered 2015-01-10 – 2015-01-12 (×9): 1 mg via INTRAVENOUS
  Filled 2015-01-10 (×9): qty 1

## 2015-01-10 MED ORDER — PHENYLEPHRINE HCL 10 MG/ML IJ SOLN
INTRAMUSCULAR | Status: DC | PRN
  Administered 2015-01-10 (×6): 80 ug via INTRAVENOUS

## 2015-01-10 MED ORDER — HYDROMORPHONE HCL 1 MG/ML IJ SOLN: 0.2500 mg | INTRAMUSCULAR | Status: DC | PRN

## 2015-01-10 MED ORDER — MIDAZOLAM HCL 5 MG/5ML IJ SOLN
INTRAMUSCULAR | Status: DC | PRN
  Administered 2015-01-10: 2 mg via INTRAVENOUS

## 2015-01-10 MED ORDER — METFORMIN HCL 500 MG PO TABS
1000.0000 mg | ORAL_TABLET | Freq: Two times a day (BID) | ORAL | Status: DC
  Administered 2015-01-10 – 2015-01-12 (×4): 1000 mg via ORAL
  Filled 2015-01-10 (×4): qty 2

## 2015-01-10 MED ORDER — CARVEDILOL 6.25 MG PO TABS
6.2500 mg | ORAL_TABLET | Freq: Two times a day (BID) | ORAL | Status: DC
  Administered 2015-01-10 – 2015-01-12 (×4): 6.25 mg via ORAL
  Filled 2015-01-10 (×4): qty 1

## 2015-01-10 MED ORDER — 0.9 % SODIUM CHLORIDE (POUR BTL) OPTIME
TOPICAL | Status: DC | PRN
  Administered 2015-01-10: 1000 mL

## 2015-01-10 MED ORDER — LACTATED RINGERS IV SOLN
INTRAVENOUS | Status: DC | PRN
  Administered 2015-01-10 (×2): via INTRAVENOUS

## 2015-01-10 MED ORDER — FENTANYL CITRATE (PF) 250 MCG/5ML IJ SOLN
INTRAMUSCULAR | Status: AC
  Filled 2015-01-10: qty 5

## 2015-01-10 MED ORDER — PROPRANOLOL HCL 1 MG/ML IV SOLN
INTRAVENOUS | Status: AC
  Filled 2015-01-10: qty 1

## 2015-01-10 MED ORDER — PROPOFOL 10 MG/ML IV BOLUS
INTRAVENOUS | Status: AC
  Filled 2015-01-10: qty 20

## 2015-01-10 MED ORDER — METOCLOPRAMIDE HCL 5 MG PO TABS: 5.0000 mg | ORAL_TABLET | Freq: Three times a day (TID) | ORAL | Status: DC | PRN

## 2015-01-10 MED ORDER — POTASSIUM CHLORIDE IN NACL 20-0.45 MEQ/L-% IV SOLN
INTRAVENOUS | Status: DC
  Administered 2015-01-10 – 2015-01-11 (×3): via INTRAVENOUS
  Filled 2015-01-10 (×6): qty 1000

## 2015-01-10 MED ORDER — ONDANSETRON HCL 4 MG/2ML IJ SOLN: 4.0000 mg | Freq: Once | INTRAMUSCULAR | Status: DC | PRN

## 2015-01-10 MED ORDER — ROCURONIUM BROMIDE 50 MG/5ML IV SOLN
INTRAVENOUS | Status: AC
  Filled 2015-01-10: qty 1

## 2015-01-10 MED ORDER — METOCLOPRAMIDE HCL 5 MG/ML IJ SOLN: 5.0000 mg | Freq: Three times a day (TID) | INTRAMUSCULAR | Status: DC | PRN

## 2015-01-10 MED ORDER — BUPIVACAINE HCL (PF) 0.25 % IJ SOLN
INTRAMUSCULAR | Status: AC
  Filled 2015-01-10: qty 30

## 2015-01-10 MED ORDER — CLINDAMYCIN PHOSPHATE 900 MG/50ML IV SOLN
900.0000 mg | INTRAVENOUS | Status: AC
  Administered 2015-01-10: 900 mg via INTRAVENOUS
  Filled 2015-01-10: qty 50

## 2015-01-10 MED ORDER — LOSARTAN POTASSIUM 50 MG PO TABS
100.0000 mg | ORAL_TABLET | Freq: Every day | ORAL | Status: DC
  Administered 2015-01-11 – 2015-01-12 (×2): 100 mg via ORAL
  Filled 2015-01-10 (×2): qty 2

## 2015-01-10 MED ORDER — LIDOCAINE-EPINEPHRINE (PF) 1.5 %-1:200000 IJ SOLN
INTRAMUSCULAR | Status: DC | PRN
  Administered 2015-01-10: 30 mL via PERINEURAL

## 2015-01-10 MED ORDER — MIDAZOLAM HCL 2 MG/2ML IJ SOLN
INTRAMUSCULAR | Status: AC
  Filled 2015-01-10: qty 2

## 2015-01-10 MED ORDER — ONDANSETRON HCL 4 MG/2ML IJ SOLN
INTRAMUSCULAR | Status: DC | PRN
  Administered 2015-01-10: 4 mg via INTRAVENOUS

## 2015-01-10 MED ORDER — FENTANYL CITRATE (PF) 100 MCG/2ML IJ SOLN
INTRAMUSCULAR | Status: DC | PRN
  Administered 2015-01-10: 100 ug via INTRAVENOUS
  Administered 2015-01-10: 25 ug via INTRAVENOUS
  Administered 2015-01-10 (×2): 50 ug via INTRAVENOUS
  Administered 2015-01-10: 25 ug via INTRAVENOUS

## 2015-01-10 MED ORDER — ACETAMINOPHEN 650 MG RE SUPP: 650.0000 mg | Freq: Four times a day (QID) | RECTAL | Status: DC | PRN

## 2015-01-10 MED ORDER — ACETAMINOPHEN 325 MG PO TABS
650.0000 mg | ORAL_TABLET | Freq: Four times a day (QID) | ORAL | Status: DC | PRN
  Administered 2015-01-10 – 2015-01-11 (×2): 650 mg via ORAL
  Filled 2015-01-10 (×2): qty 2

## 2015-01-10 MED ORDER — MEPERIDINE HCL 25 MG/ML IJ SOLN: 6.2500 mg | INTRAMUSCULAR | Status: DC | PRN

## 2015-01-10 MED ORDER — GLIMEPIRIDE 4 MG PO TABS
4.0000 mg | ORAL_TABLET | Freq: Two times a day (BID) | ORAL | Status: DC
  Administered 2015-01-10 – 2015-01-12 (×4): 4 mg via ORAL
  Filled 2015-01-10 (×4): qty 1

## 2015-01-10 MED ORDER — ALLOPURINOL 300 MG PO TABS
300.0000 mg | ORAL_TABLET | Freq: Every day | ORAL | Status: DC
  Administered 2015-01-11: 300 mg via ORAL
  Filled 2015-01-10: qty 1

## 2015-01-10 MED ORDER — LIDOCAINE HCL (CARDIAC) 20 MG/ML IV SOLN
INTRAVENOUS | Status: AC
  Filled 2015-01-10: qty 5

## 2015-01-10 MED ORDER — HYDROCHLOROTHIAZIDE 25 MG PO TABS
25.0000 mg | ORAL_TABLET | Freq: Every day | ORAL | Status: DC
  Administered 2015-01-11 – 2015-01-12 (×2): 25 mg via ORAL
  Filled 2015-01-10 (×2): qty 1

## 2015-01-10 MED ORDER — BUPIVACAINE-EPINEPHRINE (PF) 0.25% -1:200000 IJ SOLN
INTRAMUSCULAR | Status: DC | PRN
  Administered 2015-01-10: 30 mL via PERINEURAL

## 2015-01-10 MED ORDER — ATORVASTATIN CALCIUM 10 MG PO TABS
10.0000 mg | ORAL_TABLET | Freq: Every day | ORAL | Status: DC
  Administered 2015-01-11 – 2015-01-12 (×2): 10 mg via ORAL
  Filled 2015-01-10 (×2): qty 1

## 2015-01-10 MED ORDER — METHOCARBAMOL 500 MG PO TABS: 500.0000 mg | ORAL_TABLET | Freq: Four times a day (QID) | ORAL | Status: DC | PRN

## 2015-01-10 MED ORDER — OXYCODONE HCL 5 MG PO TABS
10.0000 mg | ORAL_TABLET | ORAL | Status: DC | PRN
  Administered 2015-01-10 – 2015-01-12 (×8): 10 mg via ORAL
  Filled 2015-01-10 (×8): qty 2

## 2015-01-10 MED ORDER — DEXTROSE 5 % IV SOLN: 500.0000 mg | Freq: Four times a day (QID) | INTRAVENOUS | Status: DC | PRN

## 2015-01-10 MED ORDER — ONDANSETRON HCL 4 MG PO TABS: 4.0000 mg | ORAL_TABLET | Freq: Four times a day (QID) | ORAL | Status: DC | PRN

## 2015-01-10 MED ORDER — INSULIN GLARGINE 100 UNIT/ML ~~LOC~~ SOLN
75.0000 [IU] | Freq: Every day | SUBCUTANEOUS | Status: DC
  Administered 2015-01-10 – 2015-01-11 (×2): 75 [IU] via SUBCUTANEOUS
  Filled 2015-01-10 (×4): qty 0.75

## 2015-01-10 SURGICAL SUPPLY — 72 items
BANDAGE ELASTIC 4 VELCRO ST LF (GAUZE/BANDAGES/DRESSINGS) ×3 IMPLANT
BANDAGE ELASTIC 6 VELCRO ST LF (GAUZE/BANDAGES/DRESSINGS) ×3 IMPLANT
BANDAGE ESMARK 6X9 LF (GAUZE/BANDAGES/DRESSINGS) ×2 IMPLANT
BIT DRILL CANN 3.8X150 (BIT) ×3 IMPLANT
BLADE LONG MED 31X9 (MISCELLANEOUS) ×3 IMPLANT
BLADE SAW SGTL 81X20 HD (BLADE) ×3 IMPLANT
BNDG COHESIVE 4X5 TAN STRL (GAUZE/BANDAGES/DRESSINGS) ×3 IMPLANT
BNDG ESMARK 6X9 LF (GAUZE/BANDAGES/DRESSINGS) ×3
BNDG GAUZE ELAST 4 BULKY (GAUZE/BANDAGES/DRESSINGS) ×3 IMPLANT
COVER MAYO STAND STRL (DRAPES) ×3 IMPLANT
COVER SURGICAL LIGHT HANDLE (MISCELLANEOUS) ×3 IMPLANT
CUFF TOURNIQUET SINGLE 34IN LL (TOURNIQUET CUFF) IMPLANT
CUFF TOURNIQUET SINGLE 44IN (TOURNIQUET CUFF) ×3 IMPLANT
DRAPE C-ARM 42X72 X-RAY (DRAPES) ×3 IMPLANT
DRAPE C-ARMOR (DRAPES) ×3 IMPLANT
DRAPE EXTREMITY T 121X128X90 (DRAPE) IMPLANT
DRAPE INCISE IOBAN 66X45 STRL (DRAPES) ×3 IMPLANT
DRAPE ORTHO SPLIT 77X108 STRL (DRAPES)
DRAPE PROXIMA HALF (DRAPES) ×3 IMPLANT
DRAPE SURG ORHT 6 SPLT 77X108 (DRAPES) IMPLANT
DRAPE U-SHAPE 47X51 STRL (DRAPES) ×6 IMPLANT
DRSG EMULSION OIL 3X3 NADH (GAUZE/BANDAGES/DRESSINGS) ×3 IMPLANT
DRSG PAD ABDOMINAL 8X10 ST (GAUZE/BANDAGES/DRESSINGS) ×12 IMPLANT
DURAPREP 26ML APPLICATOR (WOUND CARE) ×6 IMPLANT
ELECT REM PT RETURN 9FT ADLT (ELECTROSURGICAL) ×3
ELECTRODE REM PT RTRN 9FT ADLT (ELECTROSURGICAL) ×2 IMPLANT
EVACUATOR 1/8 PVC DRAIN (DRAIN) ×3 IMPLANT
GAUZE SPONGE 4X4 12PLY STRL (GAUZE/BANDAGES/DRESSINGS) ×3 IMPLANT
GAUZE XEROFORM 5X9 LF (GAUZE/BANDAGES/DRESSINGS) ×3 IMPLANT
GLOVE BIOGEL PI IND STRL 8 (GLOVE) ×4 IMPLANT
GLOVE BIOGEL PI INDICATOR 8 (GLOVE) ×2
GLOVE ORTHO TXT STRL SZ7.5 (GLOVE) ×6 IMPLANT
GOWN STRL REUS W/ TWL LRG LVL3 (GOWN DISPOSABLE) ×2 IMPLANT
GOWN STRL REUS W/ TWL XL LVL3 (GOWN DISPOSABLE) ×2 IMPLANT
GOWN STRL REUS W/TWL 2XL LVL3 (GOWN DISPOSABLE) ×3 IMPLANT
GOWN STRL REUS W/TWL LRG LVL3 (GOWN DISPOSABLE) ×1
GOWN STRL REUS W/TWL XL LVL3 (GOWN DISPOSABLE) ×1
K-WIRE SMOOTH (WIRE) ×6
KIT BASIN OR (CUSTOM PROCEDURE TRAY) ×3 IMPLANT
KIT ROOM TURNOVER OR (KITS) ×3 IMPLANT
KWIRE SMOOTH (WIRE) ×4 IMPLANT
MANIFOLD NEPTUNE II (INSTRUMENTS) ×3 IMPLANT
NS IRRIG 1000ML POUR BTL (IV SOLUTION) ×3 IMPLANT
PACK GENERAL/GYN (CUSTOM PROCEDURE TRAY) ×3 IMPLANT
PACK ORTHO EXTREMITY (CUSTOM PROCEDURE TRAY) ×3 IMPLANT
PAD ABD 8X10 STRL (GAUZE/BANDAGES/DRESSINGS) ×3 IMPLANT
PAD ARMBOARD 7.5X6 YLW CONV (MISCELLANEOUS) ×6 IMPLANT
PAD CAST 4YDX4 CTTN HI CHSV (CAST SUPPLIES) ×2 IMPLANT
PADDING CAST COTTON 4X4 STRL (CAST SUPPLIES) ×1
PADDING CAST COTTON 6X4 STRL (CAST SUPPLIES) ×3 IMPLANT
SCREW LAG CAN CANC 5.0MM 75MM (Screw) ×3 IMPLANT
SCREW LAG CANN CANC 5.0 20X65 (Screw) ×3 IMPLANT
SPLINT PLASTER CAST XFAST 4X15 (CAST SUPPLIES) ×2 IMPLANT
SPLINT PLASTER CAST XFAST 5X30 (CAST SUPPLIES) ×2 IMPLANT
SPLINT PLASTER XFAST SET 5X30 (CAST SUPPLIES) ×1
SPLINT PLASTER XTRA FAST SET 4 (CAST SUPPLIES) ×1
SPONGE GAUZE 4X4 12PLY STER LF (GAUZE/BANDAGES/DRESSINGS) ×3 IMPLANT
SPONGE LAP 18X18 X RAY DECT (DISPOSABLE) ×3 IMPLANT
STAPLER VISISTAT 35W (STAPLE) ×3 IMPLANT
STOCKINETTE IMPERVIOUS 9X36 MD (GAUZE/BANDAGES/DRESSINGS) IMPLANT
SUCTION FRAZIER TIP 10 FR DISP (SUCTIONS) ×3 IMPLANT
SUT ETHILON 3 0 PS 1 (SUTURE) ×6 IMPLANT
SUT ETHILON 4 0 FS 1 (SUTURE) IMPLANT
SUT VIC AB 0 CT1 27 (SUTURE)
SUT VIC AB 0 CT1 27XBRD ANBCTR (SUTURE) IMPLANT
SUT VIC AB 2-0 CT1 27 (SUTURE) ×2
SUT VIC AB 2-0 CT1 TAPERPNT 27 (SUTURE) ×4 IMPLANT
TOWEL OR 17X24 6PK STRL BLUE (TOWEL DISPOSABLE) ×6 IMPLANT
TOWEL OR 17X26 10 PK STRL BLUE (TOWEL DISPOSABLE) ×3 IMPLANT
TUBE CONNECTING 12X1/4 (SUCTIONS) ×3 IMPLANT
WATER STERILE IRR 1000ML POUR (IV SOLUTION) ×3 IMPLANT
YANKAUER SUCT BULB TIP NO VENT (SUCTIONS) ×3 IMPLANT

## 2015-01-10 NOTE — Op Note (Signed)
NAMEDASAN, HARDMAN NO.:  [id number]  MEDICAL RECORD NO.:  [id number]  LOCATION:  [facility]                         FACILITY:  [facility]  PHYSICIAN:  Draedyn Weidinger C. [name], M.D.    DATE OF BIRTH:  [birth year]  DATE OF PROCEDURE:  01/10/2015 DATE OF DISCHARGE:                              OPERATIVE REPORT   PREOPERATIVE DIAGNOSES:  Right foot planovalgus deformity, subtalar arthritis, and retained hardware opposite left foot.  POSTOPERATIVE DIAGNOSES:  Right foot planovalgus deformity, subtalar arthritis, and retained hardware opposite left foot.  PROCEDURE:  Right foot triple arthrodesis.  Screw removal opposite left foot.  ANESTHESIA:  Preoperative block plus general anesthesia.  TOURNIQUET TIME:  1 hour and 10 minutes.  SURGEON:  Roslin Norwood C. [name], M.D.  ASSISTANT:  Zonia Kief, PA-C, medically necessary and present for the entire procedure.  IMPLANTS:  A 5.0 cannulated screws x2 and removal of one 5.0 cannulated screw opposite left foot.  DESCRIPTION OF PROCEDURE:  After standard prepping and draping, a proximal thigh tourniquet has been applied.  Leg was wrapped with an Esmarch, tourniquet inflated.  Sterile glove was placed over the foot. DuraPrep was used for the prep.  Clindamycin was given.  Since vancomycin was dripping and due to his large size, 1.5 g was required. Some of the vanc had gripped in with time.  Clindamycin was given.  Leg was elevated, tourniquet inflated.  Sterile skin marker was used and incision was made along the skin crease midway between the tip of the fibula and the base of the __________.  Extensor brevis was divided, elevated, exposing the calcaneocuboid joint.  Peroneal tendon sheath was identified and the tendon preserved.  Toward the midline, the extensor tendons were elevated staying directly off bone.  Crego elevator was placed exposing the talar neck.  __________ was removed and then last 1 cm of the talus was divided with an  oscillating saw.  Cartilage was scraped off the navicular calcaneocuboid, was paired with a small oscillating saw removing 2 mm of cartilage on each side.  Then using angled curettes, straight curettes, osteotomes, the subtalar joint was prepared all the way back to the capsule posteriorly.  Care was taken to avoid damage to the FHL posterior tib tendon.  Medial incision was made for exposure of the medial talonavicular joint, removal of very prominent portion of the navicular bone.  Spot fluoro pictures were brought in.  Once decompression, correction of the deformity of plano- valgus foot was corrected and then 2 screws were placed, one across the cubocalcaneal joint and the other off from navicular into the talus. Additional bone was removed that was on the dorsum of the talonavicular joint.  Once screws were completely tightened down, final spot pictures were taken with the cannulated K-wire out of the middle of the screw for documentation of position.  Bone had been decorticated by the scrub nurse and was meticulously packed back in the subtalar joint __________talus correction of the severe flatfoot partially and then calcaneocuboid shut down so well that there was no room to pack additional bone in that area.  Some bone was packed medially at the talonavicular joint as well.  Brevis was pulled  back over 2-0 Vicryl and 2-0 Vicryl in the subcutaneous tissue, 2-0 nylon in the skin, and then single 3-0 nylon placed for the stab incision which had been made for the cannulated screws for fixation.  Hemovac drain had been placed prior to closure laterally.  Short-leg splint was applied with the 5 x 30 splint.  The patient tolerated the procedure well, transferred to the recovery room in stable condition.     Jesicca Dipierro C. Ophelia CharterYates, M.D.     MCY/MEDQ  D:  01/10/2015  T:  01/10/2015  Job:  [id number]

## 2015-01-10 NOTE — Interval H&P Note (Signed)
History and Physical Interval Note:  01/10/2015 7:21 AM  [name] [name]  has presented today for surgery, with the diagnosis of Right Foot Planovalgus Deformity, Severe Subtalar Arthritis, and Retained Hardware Left Foot  The various methods of treatment have been discussed with the patient and family. After consideration of risks, benefits and other options for treatment, the patient has consented to  Procedure(s): Right Triple Arthrodesis, Achilles Lengthening (Right) Left Foot Screw Removal  (Left) as a surgical intervention .  The patient's history has been reviewed, patient examined, no change in status, stable for surgery.  I have reviewed the patient's chart and labs.  Questions were answered to the patient's satisfaction.     YATES,MARK C

## 2015-01-10 NOTE — Anesthesia Postprocedure Evaluation (Signed)
Anesthesia Post Note  Patient: Norlene CampbellDaniel Tallarico  Procedure(s) Performed: Procedure(s) (LRB): Right Triple Arthrodesis, Achilles Lengthening (Right) Left Foot Screw Removal  (Left)  Patient location during evaluation: PACU Anesthesia Type: General Level of consciousness: awake and alert Pain management: pain level controlled Vital Signs Assessment: post-procedure vital signs reviewed and stable Respiratory status: spontaneous breathing, nonlabored ventilation, respiratory function stable and patient connected to nasal cannula oxygen Cardiovascular status: blood pressure returned to baseline and stable Postop Assessment: no signs of nausea or vomiting Anesthetic complications: no    Last Vitals:  Filed Vitals:   01/10/15 1345 01/10/15 1400  BP: 132/72   Pulse: 88 87  Temp:  36.7 C  Resp: 21 14    Last Pain:  Filed Vitals:   01/10/15 1531  PainSc: 7                  Amyrah Pinkhasov DAVID

## 2015-01-10 NOTE — Progress Notes (Signed)
Utilization review completed.  

## 2015-01-10 NOTE — Anesthesia Procedure Notes (Addendum)
Procedure Name: LMA Insertion Date/Time: 01/10/2015 7:46 AM Performed by: Gavin PoundLOWDER, HAL J Pre-anesthesia Checklist: Patient identified, Emergency Drugs available, Suction available, Patient being monitored and Timeout performed Patient Re-evaluated:Patient Re-evaluated prior to inductionOxygen Delivery Method: Circle system utilized Preoxygenation: Pre-oxygenation with 100% oxygen Intubation Type: IV induction Ventilation: Mask ventilation without difficulty LMA: LMA inserted LMA Size: 5.0 Number of attempts: 1 Placement Confirmation: positive ETCO2 and breath sounds checked- equal and bilateral Tube secured with: Tape Dental Injury: Teeth and Oropharynx as per pre-operative assessment    Anesthesia Regional Block:  Popliteal block  Pre-Anesthetic Checklist: ,, timeout performed, Correct Patient, Correct Site, Correct Laterality, Correct Procedure, Correct Position, site marked, Risks and benefits discussed,  Surgical consent,  Pre-op evaluation,  At surgeon's request and post-op pain management  Laterality: Right  Prep: chloraprep       Needles:  Injection technique: Single-shot  Needle Type: Echogenic Stimulator Needle     Needle Length: 10cm 10 cm Needle Gauge: 21 and 21 G    Additional Needles:  Procedures: ultrasound guided (picture in chart) and nerve stimulator Popliteal block  Nerve Stimulator or Paresthesia:  Response: 0.4 mA,   Additional Responses:   Narrative:  Start time: 01/10/2015 7:20 AM End time: 01/10/2015 7:30 AM Injection made incrementally with aspirations every 5 mL.  Performed by: Personally  Anesthesiologist: Arta BruceSSEY, Gertrue Willette  Additional Notes: Monitors applied. Patient sedated. Sterile prep and drape,hand hygiene and sterile gloves were used. Relevant anatomy identified.Needle position confirmed.Local anesthetic injected incrementally after negative aspiration. Local anesthetic spread visualized around nerve(s). Vascular puncture avoided. No  complications. Image printed for medical record.The patient tolerated the procedure well.  Additional Saphenous nerve block performed. 15cc Local Anesthetic mixture placed under ultrasonic guidance along the medio-inferior border of the Sartorious muscle 6 inches above the knee.  No Problems encountered.  Arta BruceKevin Rea Reser MD

## 2015-01-10 NOTE — Brief Op Note (Addendum)
01/10/2015  10:34 AM  PATIENT:  [name] [name]  54 y.o. male  PRE-OPERATIVE DIAGNOSIS:  Right Foot Planovalgus Deformity, Severe Subtalar Arthritis, and Retained Hardware Left Foot  POST-OPERATIVE DIAGNOSIS:  Right Foot Planovalgus Deformity, Severe Subtalar Arthritis, and   PROCEDURE:  Procedure(s): Right Triple Arthrodesis, Achilles Lengthening (Right) Left Foot Screw Removal  (Left) ACHILLES TENDON LENGTHING NOT DONE.per YATES SURGEON:  Surgeon(s) and Role:    * Eldred MangesMark C Yates, MD - Primary  PHYSICIAN ASSISTANT: Zonia Kiefjames owens pa-c    ANESTHESIA:   general  EBL:  Total I/O In: 1200 [I.V.:1200] Out: 25 [Blood:25]  BLOOD ADMINISTERED:none  DRAINS: hemovac  LOCAL MEDICATIONS USED:  NONE  SPECIMEN:  No Specimen  DISPOSITION OF SPECIMEN:  N/A  COUNTS:  YES  TOURNIQUET:   Total Tourniquet Time Documented: Thigh (Right) - [id number] minutes Total: Thigh (Right) - [id number] minutes   DICTATION: .[name] [name] Dictation  PLAN OF CARE: Admit to inpatient   PATIENT DISPOSITION:  PACU - hemodynamically stable.

## 2015-01-10 NOTE — Progress Notes (Signed)
RT note: Pt. set up on CPAP/Auto mode,(5 cmh20 low/16 cmh20 high) uses 14 cmh20 at home which seems too much, 2 lpm oxygen bled in with adapter, 93%, SW added for humidity, tolerating well, RN made aware, RT to monitor.

## 2015-01-10 NOTE — Transfer of Care (Signed)
Immediate Anesthesia Transfer of Care Note  Patient: [name] CampbellDaniel Weant  Procedure(s) Performed: Procedure(s): Right Triple Arthrodesis, Achilles Lengthening (Right) Left Foot Screw Removal  (Left)  Patient Location: PACU  Anesthesia Type:General  Level of Consciousness: awake  Airway & Oxygen Therapy: Patient Spontanous Breathing and Patient connected to face mask oxygen  Post-op Assessment: Report given to RN and Post -op Vital signs reviewed and stable  Post vital signs: Reviewed and stable  Last Vitals:  Filed Vitals:   01/10/15 0637 01/10/15 1015  BP: 132/79 128/93  Pulse: 74 82  Temp: 36.3 C   Resp: 20 25    Complications: No apparent anesthesia complications

## 2015-01-10 NOTE — Anesthesia Preprocedure Evaluation (Signed)
Anesthesia Evaluation  Patient identified by MRN, date of birth, ID band Patient awake    Reviewed: Allergy & Precautions, NPO status , Patient's Chart, lab work & pertinent test results  Airway Mallampati: II  TM Distance: >3 FB Neck ROM: Full    Dental   Pulmonary sleep apnea , former smoker,    Pulmonary exam normal        Cardiovascular hypertension, Pt. on medications Normal cardiovascular exam     Neuro/Psych    GI/Hepatic   Endo/Other  diabetes, Type 2, Oral Hypoglycemic Agents  Renal/GU      Musculoskeletal   Abdominal   Peds  Hematology   Anesthesia Other Findings   Reproductive/Obstetrics                             Anesthesia Physical Anesthesia Plan  ASA: III  Anesthesia Plan: General   Post-op Pain Management: GA combined w/ Regional for post-op pain   Induction: Intravenous  Airway Management Planned: LMA  Additional Equipment:   Intra-op Plan:   Post-operative Plan: Extubation in OR  Informed Consent: I have reviewed the patients History and Physical, chart, labs and discussed the procedure including the risks, benefits and alternatives for the proposed anesthesia with the patient or authorized representative who has indicated his/her understanding and acceptance.     Plan Discussed with: CRNA and Surgeon  Anesthesia Plan Comments:         Anesthesia Quick Evaluation

## 2015-01-11 ENCOUNTER — Encounter (HOSPITAL_COMMUNITY): Payer: Self-pay | Admitting: *Deleted

## 2015-01-11 LAB — GLUCOSE, CAPILLARY
GLUCOSE-CAPILLARY: 74 mg/dL (ref 65–99)
GLUCOSE-CAPILLARY: 72 mg/dL (ref 65–99)
Glucose-Capillary: 151 mg/dL — ABNORMAL HIGH (ref 65–99)
Glucose-Capillary: 99 mg/dL (ref 65–99)
Glucose-Capillary: 69 mg/dL (ref 65–99)

## 2015-01-11 LAB — BASIC METABOLIC PANEL
Anion gap: 6 (ref 5–15)
BUN: 13 mg/dL (ref 6–20)
CALCIUM: 8.3 mg/dL — AB (ref 8.9–10.3)
CHLORIDE: 99 mmol/L — AB (ref 101–111)
CO2: 31 mmol/L (ref 22–32)
CREATININE: 0.98 mg/dL (ref 0.61–1.24)
GFR calc non Af Amer: >60 mL/min (ref >60)
Glucose, Bld: 78 mg/dL (ref 65–99)
Potassium: 3.8 mmol/L (ref 3.5–5.1)
SODIUM: 136 mmol/L (ref 135–145)

## 2015-01-11 MED ORDER — OXYCODONE HCL ER 15 MG PO T12A
15.0000 mg | EXTENDED_RELEASE_TABLET | Freq: Two times a day (BID) | ORAL | Status: DC
  Administered 2015-01-11 – 2015-01-12 (×3): 15 mg via ORAL
  Filled 2015-01-11 (×3): qty 1

## 2015-01-11 NOTE — Progress Notes (Signed)
Subjective: 1 Day Post-Op Procedure(s) (LRB): Right Triple Arthrodesis, Achilles Lengthening (Right) Left Foot Screw Removal  (Left) Patient reports pain as severe .   I could not sleep, pain is worse than other foot and the alarm kept going off  On the CPAP even though O2 was at 100%.   Objective: Vital signs in last 24 hours: Temp:  [97.5 F (36.4 [name])-99.6 F (37.6 [name])] 98.6 F (37 [name]) (12/17 0403) Pulse Rate:  [75-95] 81 (12/17 0403) Resp:  [14-25] 18 (12/17 0403) BP: (113-139)/(59-76) 135/73 mmHg (12/17 0403) SpO2:  [96 %-100 %] 96 % (12/17 0403)  Intake/Output from previous day: 12/16 0701 - 12/17 0700 In: 3025 [P.O.:720; I.V.:2305] Out: 2100 [Urine:1650; Drains:425; Blood:25] Intake/Output this shift:    No results for input(s): HGB in the last 72 hours. No results for input(s): WBC, RBC, HCT, PLT in the last 72 hours.  Recent Labs  01/11/15 0440  NA 136  K 3.8  CL 99*  CO2 31  BUN 13  CREATININE 0.98  GLUCOSE 78  CALCIUM 8.3*   No results for input(s): LABPT, INR in the last 72 hours.  Neurologically intact  Assessment/Plan: 1 Day Post-Op Procedure(s) (LRB): Right Triple Arthrodesis, Achilles Lengthening (Right) Left Foot Screw Removal  (Left) Up with therapy  [name] [name] 01/11/2015, 10:30 AM

## 2015-01-11 NOTE — Evaluation (Signed)
Physical Therapy Evaluation Patient Details Name: [name] [name] MRN: [id number] DOB: 06-17-1960 Today's Date: 01/11/2015   History of Present Illness  54 y.o. male with planovalgus deformity and subtaylor arthritis, now s/p Right foot triple arthrodesis and Rt ankle screw removal. PMH: diabetes, HTN  Clinical Impression  Patient is s/p above surgery resulting in functional limitations due to the deficits listed below (see PT Problem List).  Patient will benefit from skilled PT to increase their independence and safety with mobility to allow discharge to home. Patient able to perform squat pivot transfer bed to chair with min guard assit. Recommending wheelchair at D/C for access to home and for mobility. Nursing and case management notified. Patient denies any questions or concerns.        Follow Up Recommendations No PT follow up    Equipment Recommendations  Wheelchair (measurements PT);Wheelchair cushion (measurements PT)    Recommendations for Other Services       Precautions / Restrictions Precautions Precautions: Fall Restrictions Weight Bearing Restrictions: Yes RLE Weight Bearing: Non weight bearing      Mobility  Bed Mobility Overal bed mobility: Needs Assistance Bed Mobility: Supine to Sit     Supine to sit: Min guard        Transfers Overall transfer level: Needs assistance Equipment used: None Transfers: Squat Pivot Transfers     Squat pivot transfers: Min guard     General transfer comment: bed to chair, cues for hand position  Ambulation/Gait             General Gait Details: declines attempting ambulation  Stairs            Wheelchair Mobility    Modified Rankin (Stroke Patients Only)       Balance Overall balance assessment: Needs assistance Sitting-balance support: No upper extremity supported Sitting balance-Leahy Scale: Good     Standing balance support: Bilateral upper extremity supported Standing balance-Leahy  Scale: Poor Standing balance comment: squat pivot turn only                             Pertinent Vitals/Pain Pain Assessment: 0-10 Pain Score: 5  Pain Location: Rt foot Pain Descriptors / Indicators: Pressure Pain Intervention(s): Limited activity within patient's tolerance;Monitored during session;Other (comment) (LE elevated)    Home Living Family/patient expects to be discharged to:: Private residence Living Arrangements: Spouse/significant other Available Help at Discharge: Family;Available 24 hours/day Type of Home: House Home Access: Ramped entrance     Home Layout: One level Home Equipment: Walker - 2 wheels;Crutches;Other (comment) (knee scooter) Additional Comments: reports he will need a wheelchair to get into his home.     Prior Function Level of Independence: Independent         Comments: limited standing tolerance     Hand Dominance        Extremity/Trunk Assessment   Upper Extremity Assessment: Defer to OT evaluation           Lower Extremity Assessment: RLE deficits/detail RLE Deficits / Details: able to raise independently for bed mobility       Communication      Cognition Arousal/Alertness: Awake/alert Behavior During Therapy: WFL for tasks assessed/performed Overall Cognitive Status: Within Functional Limits for tasks assessed                      General Comments      Exercises        Assessment/Plan  PT Assessment Patient needs continued PT services  PT Diagnosis Difficulty walking;Acute pain   PT Problem List Decreased strength;Decreased range of motion;Decreased activity tolerance;Decreased balance;Decreased mobility;Decreased knowledge of use of DME;Pain  PT Treatment Interventions DME instruction;Gait training;Functional mobility training;Therapeutic activities;Therapeutic exercise;Balance training;Patient/family education   PT Goals (Current goals can be found in the Care Plan section) Acute Rehab  PT Goals Patient Stated Goal: To be able to go home PT Goal Formulation: With patient Time For Goal Achievement: 01/25/15 Potential to Achieve Goals: Good    Frequency Min 5X/week   Barriers to discharge        Co-evaluation               End of Session Equipment Utilized During Treatment: Gait belt Activity Tolerance: Patient tolerated treatment well Patient left: in chair;with call bell/phone within reach;Other (comment) (LE elevated) Nurse Communication: Mobility status;Need for lift equipment         Time: [phone] PT Time Calculation (min) (ACUTE ONLY): 28 min   Charges:   PT Evaluation $Initial PT Evaluation Tier I: 1 Procedure PT Treatments $Therapeutic Activity: 8-22 mins   PT G Codes:        Christiane HaBenjamin J. Megen Madewell, PT, CSCS Pager [phone] Office [phone]  01/11/2015, 10:16 AM

## 2015-01-11 NOTE — Progress Notes (Signed)
OT Cancellation Note  Patient Details Name: [name] CampbellDaniel [name] MRN: [id number] DOB: Mar 21, 1960   Cancelled Treatment:    Reason Eval/Treat Not Completed: OT screened, no needs identified, will sign off  [facility] Hospital[facility],HILLARY  Allie Gerhold, OTR/L  [phone] 01/11/2015 01/11/2015, 3:40 PM

## 2015-01-11 NOTE — Care Management Note (Signed)
Case Management Note  Patient Details  Name: Norlene CampbellDaniel Cornelio MRN: [id number] Date of Birth: [birth year]  Subjective/Objective:   54 yr old male s/p left foot srew removal, right foot triple arthrodesis.Patient will be non weight bearing.                  Action/Plan:  Case manager requested to order wheelchair. No further needs at this time.    Expected Discharge Date:    01/11/15              Expected Discharge Plan:   home  In-House Referral:     Discharge planning Services  CM Consult  Post Acute Care Choice:  Durable Medical Equipment Choice offered to:  NA  DME Arranged:  Wheelchair manual DME Agency:  [facility]  HH Arranged:  NA HH Agency:     Status of Service:  In process, will continue to follow  Medicare Important Message Given:    Date Medicare IM Given:    Medicare IM give by:    Date Additional Medicare IM Given:    Additional Medicare Important Message give by:     If discussed at Long Length of Stay Meetings, dates discussed:    Additional Comments:  [name], Spenser Harren Naomi, RN 01/11/2015, 10:03 AM

## 2015-01-11 NOTE — Progress Notes (Addendum)
Pt c/o RLEpain. States it feels like the ACE and splint feels too tight. Toes are warm with less than 4 sec cal refill. and pedal pulse palpable under wraps. Ice applied and pain meds given

## 2015-01-11 NOTE — Progress Notes (Signed)
Pt will place self on/off cpap.   Rt will monitor 

## 2015-01-11 NOTE — Progress Notes (Addendum)
Pt reports being intolerant of CPAP after 1 hour---Pt remove and refusing CPAP. Continuous SPO2 of 96+% on 2L [place]

## 2015-01-12 LAB — BASIC METABOLIC PANEL
Anion gap: 7 (ref 5–15)
BUN: 9 mg/dL (ref 6–20)
CHLORIDE: 93 mmol/L — AB (ref 101–111)
CO2: 33 mmol/L — ABNORMAL HIGH (ref 22–32)
CREATININE: 1.1 mg/dL (ref 0.61–1.24)
Calcium: 8.5 mg/dL — ABNORMAL LOW (ref 8.9–10.3)
GFR calc Af Amer: >60 mL/min (ref >60)
GFR calc non Af Amer: >60 mL/min (ref >60)
GLUCOSE: 135 mg/dL — AB (ref 65–99)
POTASSIUM: 3.5 mmol/L (ref 3.5–5.1)
SODIUM: 133 mmol/L — AB (ref 135–145)

## 2015-01-12 LAB — GLUCOSE, CAPILLARY: GLUCOSE-CAPILLARY: 143 mg/dL — AB (ref 65–99)

## 2015-01-12 MED ORDER — OXYCODONE-ACETAMINOPHEN 10-325 MG PO TABS
1.0000 | ORAL_TABLET | ORAL | Status: DC | PRN
Start: 2015-01-12 — End: 2017-03-02

## 2015-01-12 MED ORDER — METHOCARBAMOL 500 MG PO TABS: 500.0000 mg | ORAL_TABLET | Freq: Four times a day (QID) | ORAL | Status: DC | PRN

## 2015-01-12 NOTE — Progress Notes (Signed)
PT Cancellation Note  Patient Details Name: Norlene CampbellDaniel Servello MRN: [id number] DOB: [birth year]   Cancelled Treatment:    Reason Eval/Treat Not Completed: Other (comment)   Mr. Trixie DredgeLathan is politely declining PT, stating he knows how to manage, and he anticipates DC home today  Van ClinesHolly Leslieann Whisman, [place]PT  Acute Rehabilitation Services Pager [phone] Office [phone]    Van ClinesGarrigan, Kara Melching [facility] Centeramff 01/12/2015, 10:34 AM

## 2015-01-12 NOTE — Progress Notes (Signed)
Subjective: 2 Days Post-Op Procedure(s) (LRB): Right Triple Arthrodesis, Achilles Lengthening (Right) Left Foot Screw Removal  (Left) Patient reports pain as 4 on 0-10 scale.    Objective: Vital signs in last 24 hours: Temp:  [99.6 F (37.6 C)-99.8 F (37.7 C)] 99.7 F (37.6 C) (12/18 0552) Pulse Rate:  [81-93] 81 (12/18 0552) Resp:  [18] 18 (12/18 0552) BP: (120-131)/(62-69) 120/69 mmHg (12/18 0552) SpO2:  [95 %-96 %] 96 % (12/18 0552)  Intake/Output from previous day: 12/17 0701 - 12/18 0700 In: -  Out: 2600 [Urine:2600] Intake/Output this shift:    No results for input(s): HGB in the last 72 hours. No results for input(s): WBC, RBC, HCT, PLT in the last 72 hours.  Recent Labs  01/11/15 0440 01/12/15 0620  NA 136 133*  K 3.8 3.5  CL 99* 93*  CO2 31 33*  BUN 13 9  CREATININE 0.98 1.10  GLUCOSE 78 135*  CALCIUM 8.3* 8.5*   No results for input(s): LABPT, INR in the last 72 hours.  Neurologically intact  Assessment/Plan: 2 Days Post-Op Procedure(s) (LRB): Right Triple Arthrodesis, Achilles Lengthening (Right) Left Foot Screw Removal  (Left) Plan : discharge home.   Nicklous Aburto C 01/12/2015, 9:53 AM

## 2015-01-13 ENCOUNTER — Encounter (HOSPITAL_COMMUNITY): Payer: Self-pay | Admitting: Orthopaedic Surgery

## 2015-01-15 ENCOUNTER — Encounter (HOSPITAL_COMMUNITY): Payer: Self-pay | Admitting: Orthopaedic Surgery

## 2015-02-04 NOTE — Discharge Summary (Signed)
Patient ID: [name] [name] MRN: [id number] DOB/AGE: [birth year] 54 y.o.  Admit date: 01/10/2015 Discharge date: 02/04/2015  Admission Diagnoses:  Active Problems:   Arthrodesis status   Discharge Diagnoses:  Active Problems:   Arthrodesis status  status post Procedure(s): Right Triple Arthrodesis, Achilles Lengthening Left Foot Screw Removal   Past Medical History  Diagnosis Date  . Hypertension   . OA (osteoarthritis) of hip     Hx: of  . Heart murmur     Hx: of as achild  . Kidney stones     Hx: of  . Inguinal hernia     Hx: of left groin  . Diabetes mellitus without complication (HCC)     type 2   . Sleep apnea     DOESN'T WEAR CPAP - 'CLOSED IN FEELING'.  tested [facility] yrs ago @ [facility]    Surgeries: Procedure(s): Right Triple Arthrodesis, Achilles Lengthening Left Foot Screw Removal  on 01/10/2015   Consultants:    Discharged Condition: Improved  Hospital Course: [name] [name] is an 55 y.o. male who was admitted 01/10/2015 for operative treatment of djd/deformity. Patient failed conservative treatments (please see the history and physical for the specifics) and had severe unremitting pain that affects sleep, daily activities and work/hobbies. After pre-op clearance, the patient was taken to the operating room on 01/10/2015 and underwent  Procedure(s): Right Triple Arthrodesis, Achilles Lengthening Left Foot Screw Removal .    Patient was given perioperative antibiotics:  Anti-infectives    Start     Dose/Rate Route Frequency Ordered Stop   01/10/15 0815  clindamycin (CLEOCIN) IVPB 900 mg     900 mg 100 mL/hr over 30 Minutes Intravenous To Surgery 01/10/15 0803 01/10/15 0807   01/10/15 0715  vancomycin (VANCOCIN) 1,500 mg in sodium chloride 0.9 % 500 mL IVPB  Status:  Discontinued     1,500 mg 250 mL/hr over 120 Minutes Intravenous To ShortStay Surgical 01/09/15 1500 01/10/15 1426       Patient was given sequential compression devices and early ambulation to  prevent DVT.   Patient benefited maximally from hospital stay and there were no complications. At the time of discharge, the patient was urinating/moving their bowels without difficulty, tolerating a regular diet, pain is controlled with oral pain medications and they have been cleared by PT/OT.   Recent vital signs: No data found.    Recent laboratory studies: No results for input(s): WBC, HGB, HCT, PLT, NA, K, CL, CO2, BUN, CREATININE, GLUCOSE, INR, CALCIUM in the last 72 hours.  Invalid input(s): PT, 2   Discharge Medications:     Medication List    TAKE these medications        allopurinol 300 MG tablet  Commonly known as:  ZYLOPRIM  Take 300 mg by mouth daily at 12 noon.     ascorbic acid 500 MG tablet  Commonly known as:  VITAMIN C  Take 500 mg by mouth daily.     aspirin EC 325 MG tablet  Take 1 tablet (325 mg total) by mouth daily.     atorvastatin 10 MG tablet  Commonly known as:  LIPITOR  Take 10 mg by mouth daily.     b complex vitamins tablet  Take 1 tablet by mouth daily.     carvedilol 6.25 MG tablet  Commonly known as:  COREG  Take 6.25 mg by mouth 2 (two) times daily with a meal.     glimepiride 4 MG tablet  Commonly known as:  AMARYL  Take 4 mg by mouth 2 (two) times daily with a meal.     hydrochlorothiazide 25 MG tablet  Commonly known as:  HYDRODIURIL  Take 25 mg by mouth daily before breakfast.     insulin glargine 100 UNIT/ML injection  Commonly known as:  LANTUS  Inject 25-75 Units into the skin 2 (two) times daily. 25units in the morning and 75 units in the evening     losartan 100 MG tablet  Commonly known as:  COZAAR  Take 100 mg by mouth daily.     metFORMIN 1000 MG tablet  Commonly known as:  GLUCOPHAGE  Take 1,000 mg by mouth 2 (two) times daily with a meal.     methocarbamol 500 MG tablet  Commonly known as:  ROBAXIN  Take 1 tablet (500 mg total) by mouth every 6 (six) hours as needed for muscle spasms.      oxyCODONE-acetaminophen 10-325 MG tablet  Commonly known as:  PERCOCET  Take 1 tablet by mouth every 4 (four) hours as needed for pain.     Vitamin D 2000 units Caps  Take 2,000 Units by mouth daily.        Diagnostic Studies: Dg Foot 2 Views Right  01/10/2015  CLINICAL DATA:  Arthrodesis. EXAM: DG C-ARM 61-120 MIN; RIGHT FOOT - 2 VIEW FLUOROSCOPY TIME:  7 seconds. COMPARISON:  May 29, 2014. FINDINGS: Two intraoperative fluoroscopic images were obtained. These demonstrate removal of pre-existing screws, and placement of 2 new screws for purposes of fusion of the calcaneus, talus, cuboid and navicular bones. IMPRESSION: Status post placement of 2 new screws for purposes of arthrodesis of the right foot. Electronically Signed   By: Lupita RaiderJames  Green Jr, M.D.   On: 01/10/2015 09:54   Dg C-arm 1-60 Min  01/10/2015  CLINICAL DATA:  Arthrodesis. EXAM: DG C-ARM 61-120 MIN; RIGHT FOOT - 2 VIEW FLUOROSCOPY TIME:  7 seconds. COMPARISON:  May 29, 2014. FINDINGS: Two intraoperative fluoroscopic images were obtained. These demonstrate removal of pre-existing screws, and placement of 2 new screws for purposes of fusion of the calcaneus, talus, cuboid and navicular bones. IMPRESSION: Status post placement of 2 new screws for purposes of arthrodesis of the right foot. Electronically Signed   By: Lupita RaiderJames  Green Jr, M.D.   On: 01/10/2015 09:54        Discharge Plan:  discharge to home  Disposition:     Signed: Naida SleightOWENS,JAMES M  02/04/2015, 1:43 PM

## 2015-10-27 DIAGNOSIS — E119 Type 2 diabetes mellitus without complications: Secondary | ICD-10-CM | POA: Diagnosis not present

## 2016-01-16 DIAGNOSIS — G4733 Obstructive sleep apnea (adult) (pediatric): Secondary | ICD-10-CM | POA: Diagnosis not present

## 2016-02-17 DIAGNOSIS — E7800 Pure hypercholesterolemia, unspecified: Secondary | ICD-10-CM | POA: Diagnosis not present

## 2016-02-17 DIAGNOSIS — E1165 Type 2 diabetes mellitus with hyperglycemia: Secondary | ICD-10-CM | POA: Diagnosis not present

## 2016-02-17 DIAGNOSIS — I10 Essential (primary) hypertension: Secondary | ICD-10-CM | POA: Diagnosis not present

## 2016-02-18 IMAGING — CR DG CHEST 1V PORT
1 series · 1 of 1 positions shown · non-contrast
Comparison: 05/22/2014.

CLINICAL DATA: Hypoxia.

EXAM:
PORTABLE CHEST - 1 VIEW

[AP]
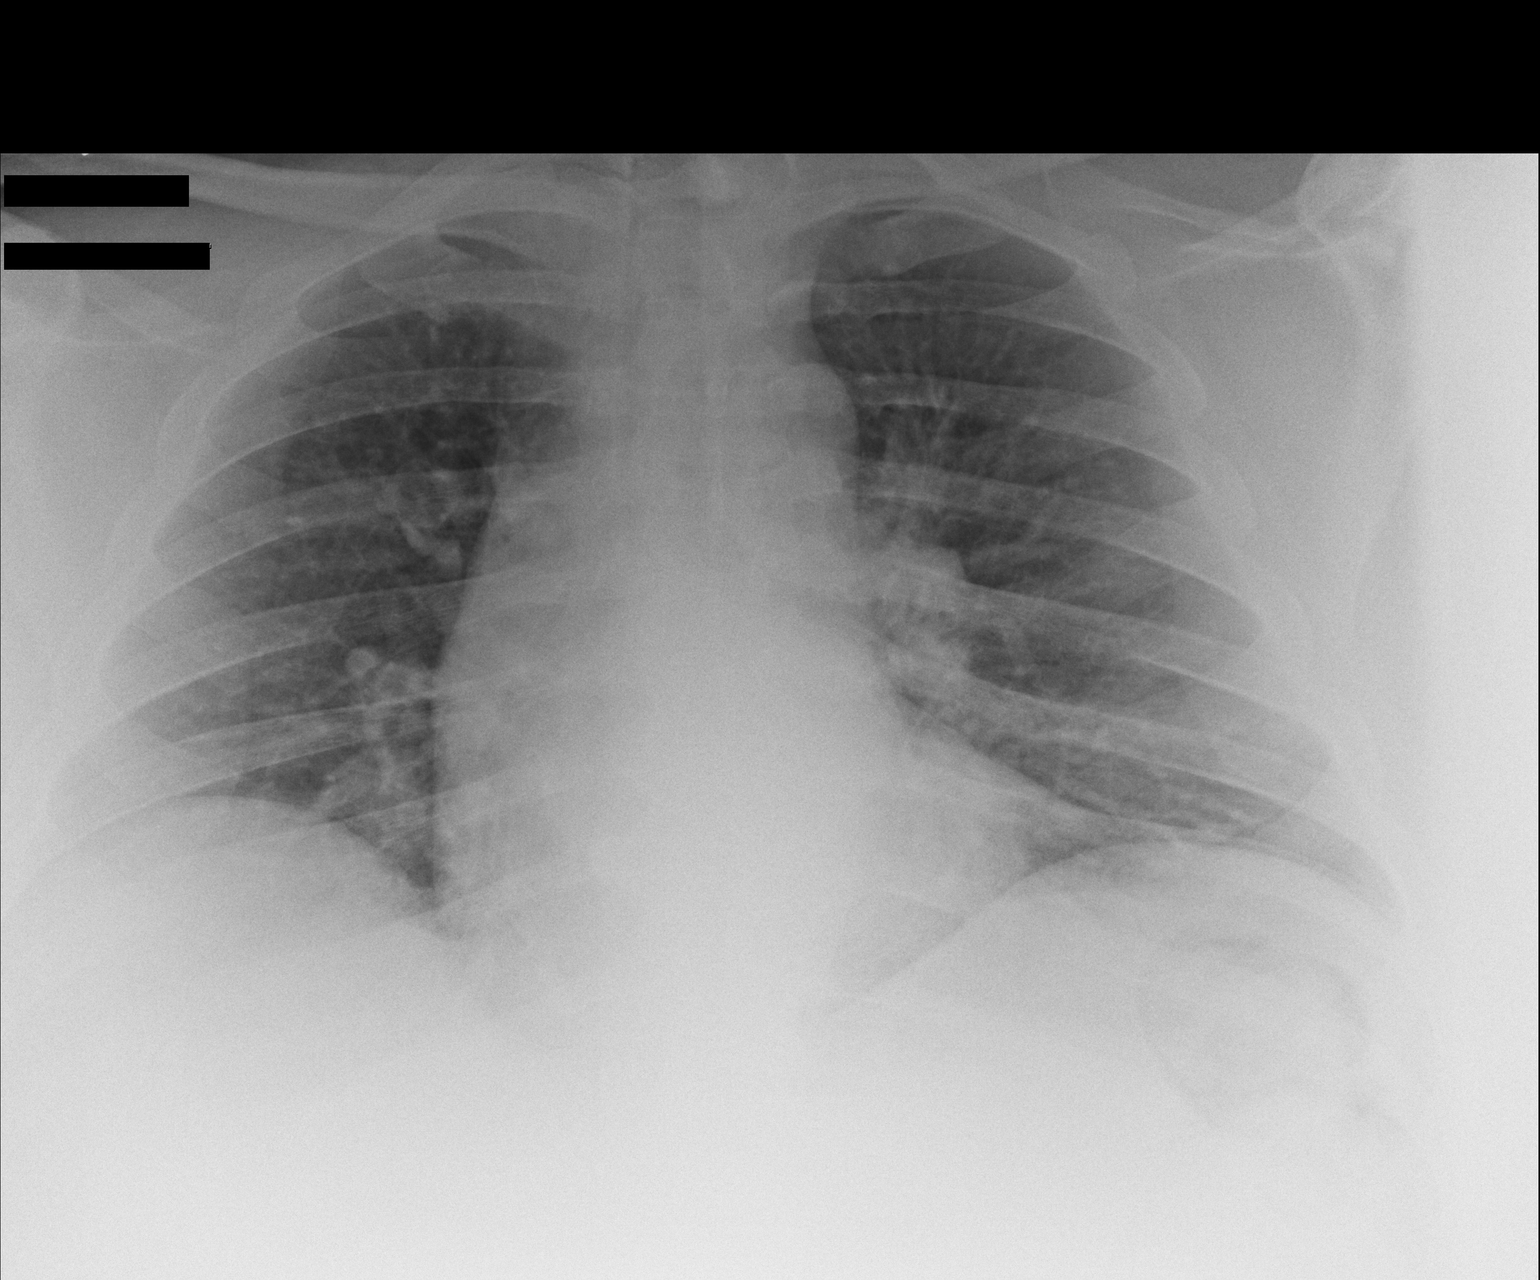

[1 of 1 positions shown; findings below may reference images not displayed]

FINDINGS: Poor inspiration. Grossly normal sized heart. Clear lungs with
normal vascularity. Lower thoracic spine degenerative changes.
IMPRESSION: No acute abnormality.

## 2016-02-18 IMAGING — CT CT ANGIO CHEST
2 of 9 series · 17 of 46 positions shown · IV contrast (Omni 300)
Comparison: Chest radiograph earlier today

CLINICAL DATA: Acute respiratory failure with hypoxia.

EXAM:
CT ANGIOGRAPHY CHEST WITH CONTRAST
TECHNIQUE: Multidetector CT imaging of the chest was performed using the
standard protocol during bolus administration of intravenous
contrast. Multiplanar CT image reconstructions and MIPs were
obtained to evaluate the vascular anatomy.
CONTRAST:  75mL OMNIPAQUE IOHEXOL 350 MG/ML SOLN

[Series 5: thins · axial · 0.88mm/px · z∈[+122,+362]mm · 14 of 269 slices shown]
[im 15/269  lung]
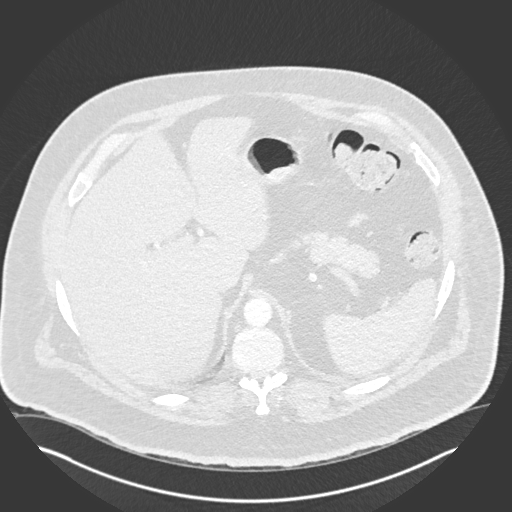
[im 30/269  soft-tissue]
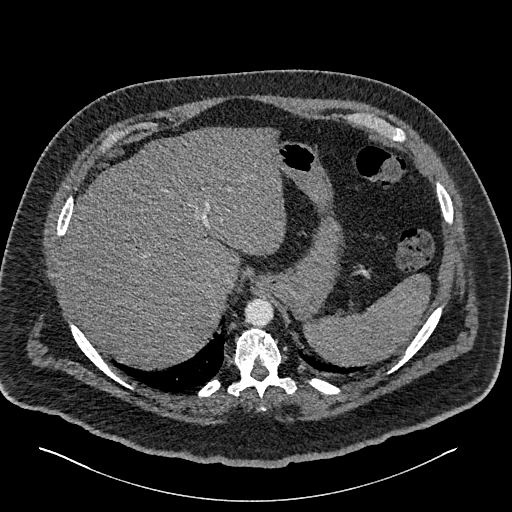
[im 60/269  lung]
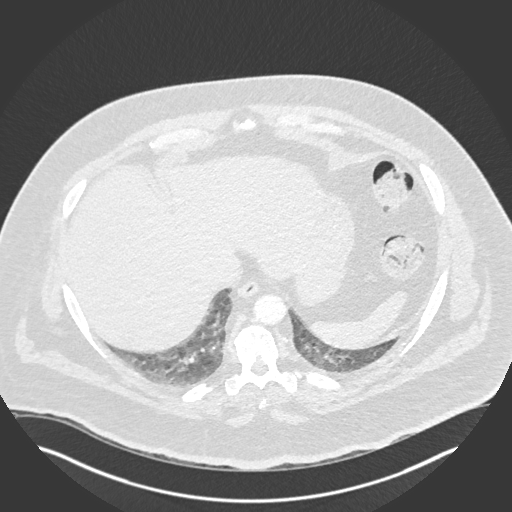
[im 75/269  soft-tissue]
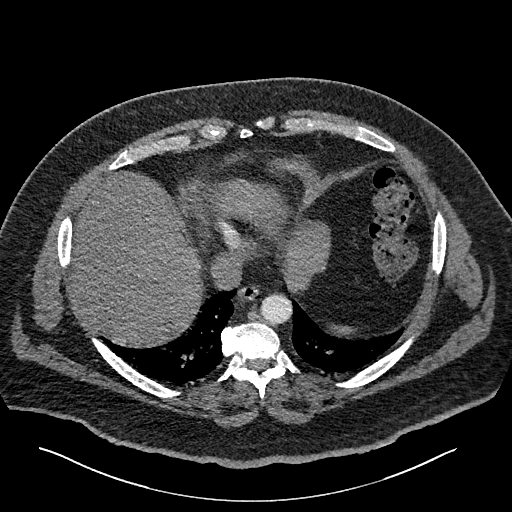
[im 90/269  lung]
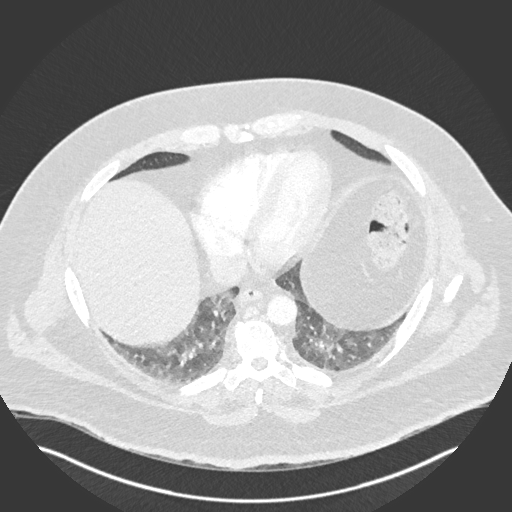
[im 105/269  soft-tissue]
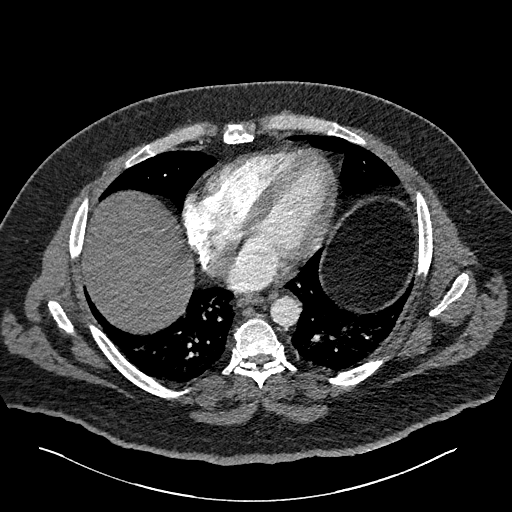
[im 120/269  lung]
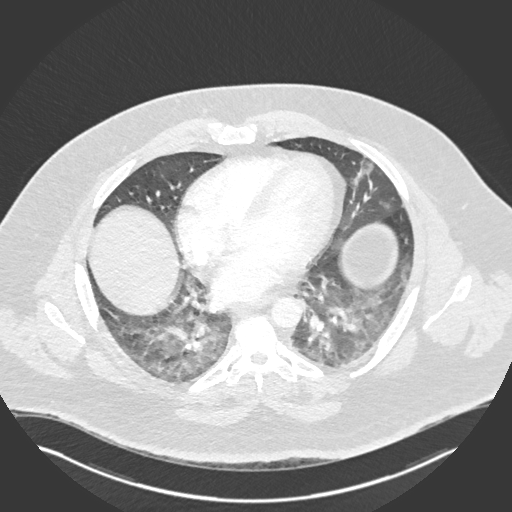
[im 149/269  soft-tissue]
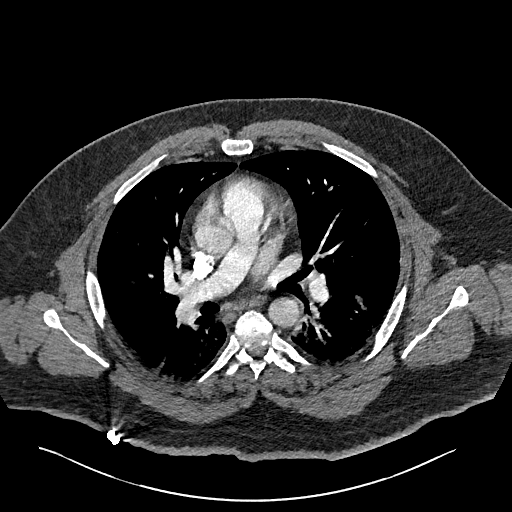
[im 164/269  lung]
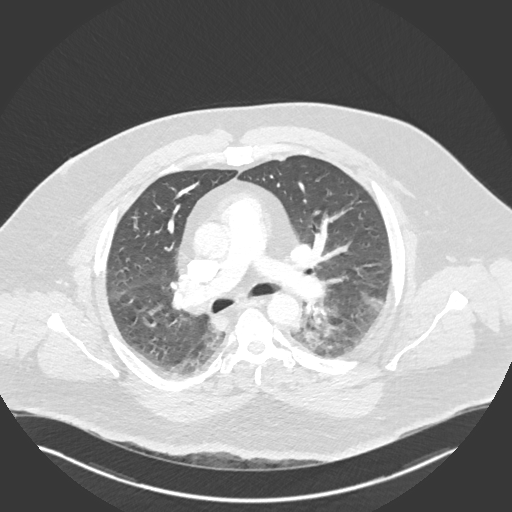
[im 179/269  soft-tissue]
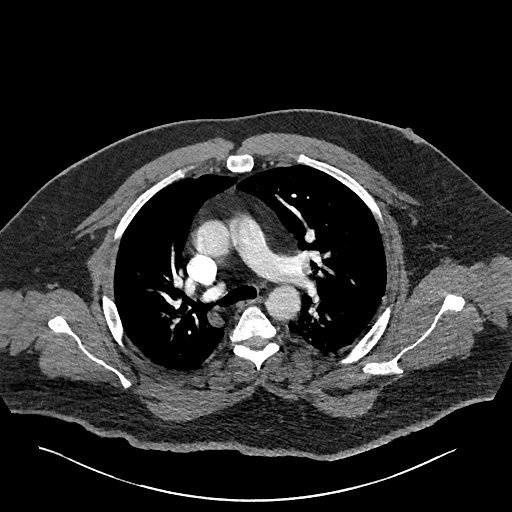
[im 194/269  lung]
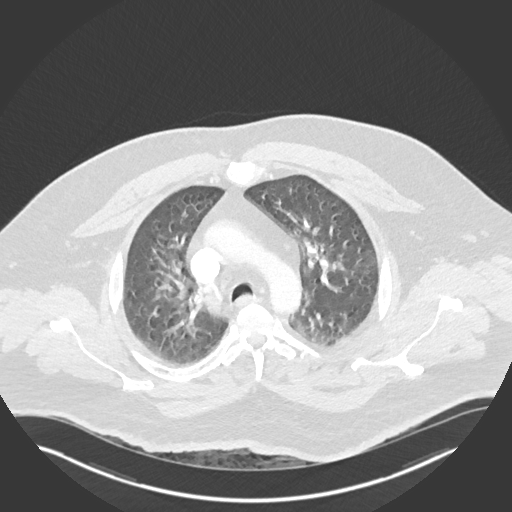
[im 209/269  soft-tissue]
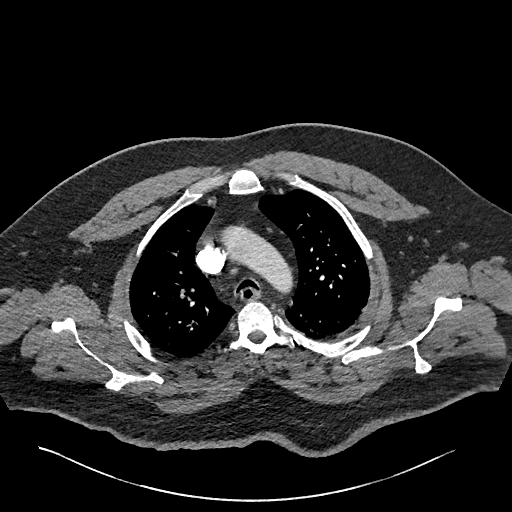
[im 239/269  lung]
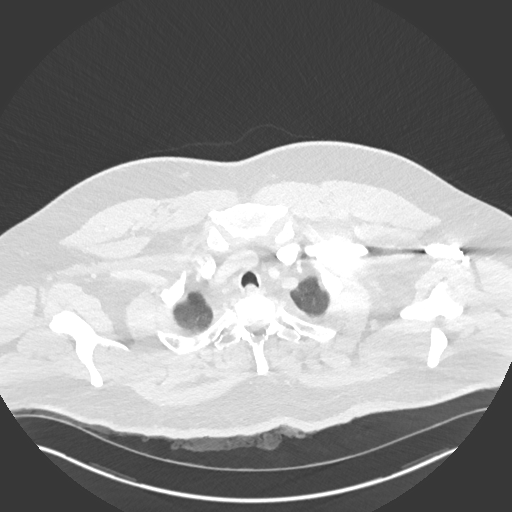
[im 254/269  soft-tissue]
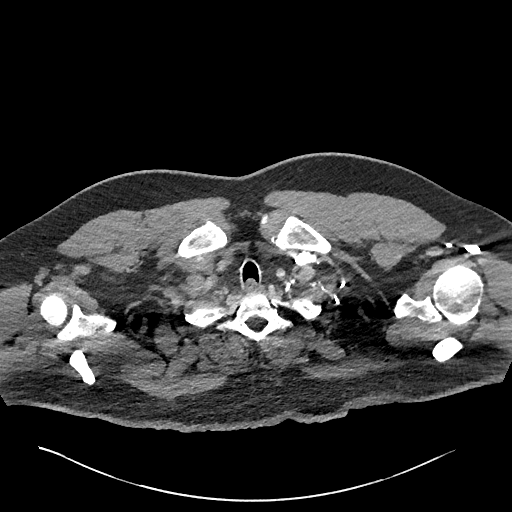

[Series 7: coronal mpr · coronal · 0.61mm/px · 3 of 140 slices shown]
[im 35/140  soft-tissue]
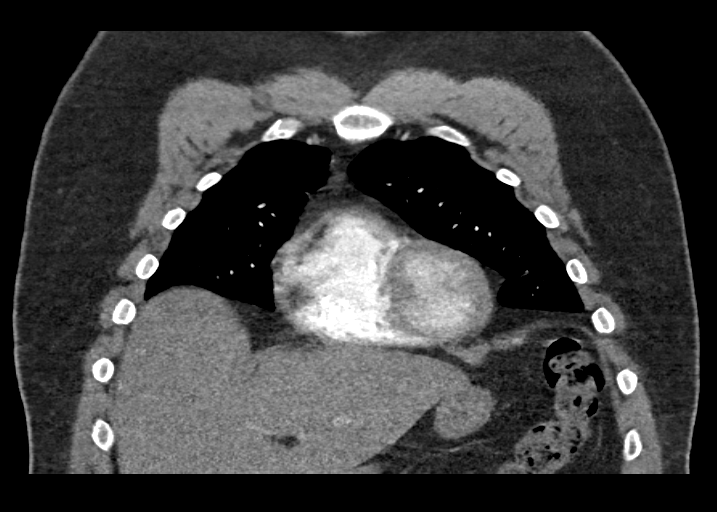
[im 70/140  soft-tissue]
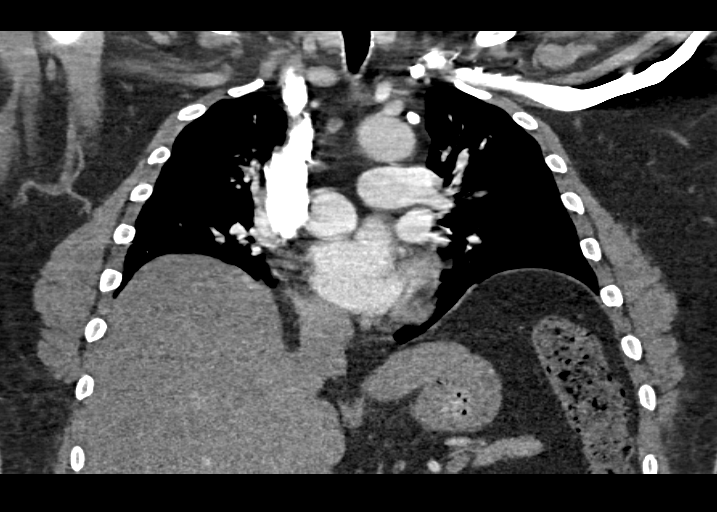
[im 105/140  soft-tissue]
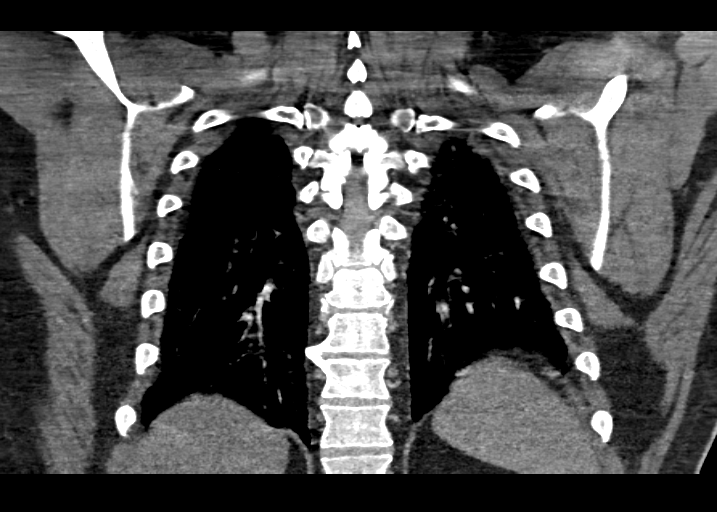

[17 of 46 positions shown; findings below may reference images not displayed]

FINDINGS: Pulmonary arterial opacification is suboptimal, and there is also
mild-to-moderate motion artifact. No lobar or more central emboli
are identified. Segmental and subsegmental branches are not well
evaluated.

Small mediastinal lymph nodes measure up to 9 mm in short axis,
likely reactive. No enlarged hilar lymph nodes are identified. Heart
is normal in size. There is no pleural or pericardial effusion.

Evaluation of the lung parenchyma is limited by respiratory motion
artifact. A 6 mm nodule is present in the right lung apex (series 6,
image 7). There is also suggestion of additional small nodular and
patchy ground-glass opacities slightly more inferiorly in the right
upper lobe. Opacities in the lower lobes and lingula are favored to
be due to respiratory motion, however underlying infectious or
inflammatory processes not excluded.

The visualized portion the upper abdomen is unremarkable. No acute
osseous abnormality is seen.

Review of the MIP images confirms the above findings.
IMPRESSION: 1. Examination partially limited by suboptimal pulmonary arterial
opacification and moderate motion artifact. No evidence of central
pulmonary emboli.
2. Small right upper lobe lung nodules, possibly with some
associated ground-glass opacity. These may be
infectious/inflammatory. If the patient is at high risk for
bronchogenic carcinoma, follow-up chest CT at 6-12 months is
recommended. If the patient is at low risk for bronchogenic
carcinoma, follow-up chest CT at 12 months is recommended. This
recommendation follows the consensus statement: Guidelines for
Management of Small Pulmonary Nodules Detected on CT Scans: A
Statement from the [HOSPITAL] as published in Radiology

## 2016-04-15 DIAGNOSIS — G4733 Obstructive sleep apnea (adult) (pediatric): Secondary | ICD-10-CM | POA: Diagnosis not present

## 2016-06-22 DIAGNOSIS — E1165 Type 2 diabetes mellitus with hyperglycemia: Secondary | ICD-10-CM | POA: Diagnosis not present

## 2016-06-22 DIAGNOSIS — E7800 Pure hypercholesterolemia, unspecified: Secondary | ICD-10-CM | POA: Diagnosis not present

## 2016-06-22 DIAGNOSIS — M109 Gout, unspecified: Secondary | ICD-10-CM | POA: Diagnosis not present

## 2016-07-01 DIAGNOSIS — J189 Pneumonia, unspecified organism: Secondary | ICD-10-CM | POA: Diagnosis not present

## 2016-07-16 DIAGNOSIS — L02419 Cutaneous abscess of limb, unspecified: Secondary | ICD-10-CM | POA: Diagnosis not present

## 2016-07-16 DIAGNOSIS — L03119 Cellulitis of unspecified part of limb: Secondary | ICD-10-CM | POA: Diagnosis not present

## 2016-07-26 DIAGNOSIS — G4733 Obstructive sleep apnea (adult) (pediatric): Secondary | ICD-10-CM | POA: Diagnosis not present

## 2016-08-15 DIAGNOSIS — L03115 Cellulitis of right lower limb: Secondary | ICD-10-CM | POA: Diagnosis not present

## 2016-08-30 DIAGNOSIS — I10 Essential (primary) hypertension: Secondary | ICD-10-CM | POA: Diagnosis not present

## 2016-08-30 DIAGNOSIS — E1165 Type 2 diabetes mellitus with hyperglycemia: Secondary | ICD-10-CM | POA: Diagnosis not present

## 2016-08-30 DIAGNOSIS — E7800 Pure hypercholesterolemia, unspecified: Secondary | ICD-10-CM | POA: Diagnosis not present

## 2016-12-24 DIAGNOSIS — E119 Type 2 diabetes mellitus without complications: Secondary | ICD-10-CM | POA: Diagnosis not present

## 2017-01-28 ENCOUNTER — Encounter (INDEPENDENT_AMBULATORY_CARE_PROVIDER_SITE_OTHER): Payer: Self-pay | Admitting: Orthopaedic Surgery

## 2017-01-28 ENCOUNTER — Ambulatory Visit (INDEPENDENT_AMBULATORY_CARE_PROVIDER_SITE_OTHER): Payer: BLUE CROSS/BLUE SHIELD | Admitting: Orthopaedic Surgery

## 2017-01-28 ENCOUNTER — Ambulatory Visit (INDEPENDENT_AMBULATORY_CARE_PROVIDER_SITE_OTHER): Payer: BLUE CROSS/BLUE SHIELD

## 2017-01-28 VITALS — BP 152/84 | HR 83 | Ht 74.0 in | Wt 350.0 lb

## 2017-01-28 DIAGNOSIS — Z96642 Presence of left artificial hip joint: Secondary | ICD-10-CM | POA: Diagnosis not present

## 2017-01-28 DIAGNOSIS — M25551 Pain in right hip: Secondary | ICD-10-CM | POA: Diagnosis not present

## 2017-01-28 DIAGNOSIS — M1611 Unilateral primary osteoarthritis, right hip: Secondary | ICD-10-CM | POA: Diagnosis not present

## 2017-01-28 NOTE — Progress Notes (Signed)
Office Visit Note   Patient: [name] CampbellDaniel [name]           Date of Birth: 1960/02/06           MRN: [id number] Visit Date: 01/28/2017              Requested by: Rolm GalaGrandis, Heidi, MD [address] [place], [place] [id number] PCP: Rolm GalaGrandis, Heidi, MD   Assessment & Plan: Visit Diagnoses:  1. Pain in right hip   2. Unilateral primary osteoarthritis, right hip   3. Hx of total hip arthroplasty, left     Plan: Patient's had previous left total hip arthroplasty doing well.  Right hip is painful with bone-on-bone changes with progressive limp, subchondral sclerosis, loss of joint space and marginal osteophytes.  He states he like to proceed with right total hip arthroplasty.  Plan would be direct anterior right total hip arthroplasty.  Risks of surgery discussed, he has a #11 stem 54 mm acetabulum on the opposite left hip.  We again discussed the importance of diet and weight loss.  Sleep apnea is present.  Questions were elicited and answered he can call about scheduling.  Follow-Up Instructions: No Follow-up on file.   Orders:  Orders Placed This Encounter  Procedures  . XR HIP UNILAT W OR W/O PELVIS 2-3 VIEWS RIGHT   No orders of the defined types were placed in this encounter.     Procedures: No procedures performed   Clinical Data: No additional findings.   Subjective: Chief Complaint  Patient presents with  . Right Hip - Pain    HPI 57 year old male had left total hip arthroplasty 2015 which is doing well.  Progressive increasing right hip pain times greater than 6 months.  He has been walking with a limp and it is getting worse.  Wakes him up at night bothers him when he ambulates in the community.  Review of Systems left total hip arthroplasty 2015.  Left and then right triple arthrodesis with heel cord lengthening for planovalgus foot deformity done in 2016 both doing well.  Morbid obesity with sleep apnea.  Otherwise negative as it pertains HPI, 14 point review of systems  updated.   Objective: Vital Signs: BP (!) 152/84   Pulse 83   Ht 6\' 2"  (1.88 m)   Wt (!) 350 lb (158.8 kg)   BMI 44.94 kg/m   Physical Exam  Constitutional: He is oriented to person, place, and time. He appears well-developed and well-nourished.  HENT:  Head: Normocephalic and atraumatic.  Eyes: EOM are normal. Pupils are equal, round, and reactive to light.  Neck: No tracheal deviation present. No thyromegaly present.  Cardiovascular: Normal rate.  Pulmonary/Chest: Effort normal. He has no wheezes.  Abdominal: Soft. Bowel sounds are normal.  Neurological: He is alert and oriented to person, place, and time.  Skin: Skin is warm and dry. Capillary refill takes less than 2 seconds.  Psychiatric: He has a normal mood and affect. His behavior is normal. Judgment and thought content normal.    Ortho Exam well-healed right anterior hip incision.  No subtalar motion right or left is amatory without limp foot or ankle pain with ambulation.  Positive logroll the hip on the right with pain with internal rotation at 15 degrees on the right external rotation 30 degrees left hip no pain with internal/external rotation 45 degrees.  No hip flexion contracture right.  Specialty Comments:  No specialty comments available.  Imaging: No results found.   PMFS History: Patient  Active Problem List   Diagnosis Date Noted  . Arthrodesis status 01/10/2015  . Postoperative fever 05/31/2014  . Acute respiratory failure with hypoxia (HCC) 05/31/2014  . Status post arthrodesis 05/29/2014   Past Medical History:  Diagnosis Date  . Diabetes mellitus without complication (HCC)    type 2   . Heart murmur    Hx: of as achild  . Hypertension   . Inguinal hernia    Hx: of left groin  . Kidney stones    Hx: of  . OA (osteoarthritis) of hip    Hx: of  . Sleep apnea    DOESN'T WEAR CPAP - 'CLOSED IN FEELING'.  tested 5 yrs ago @ [facility]    Family History  Problem Relation Age of Onset  . Diabetes  Mother   . Hypertension Mother   . Cancer - Other Mother   . Cancer - Lung Father   . Arthritis Sister   . Heart disease Sister     Past Surgical History:  Procedure Laterality Date  . COLONOSCOPY     Hx: of  . FOOT ARTHRODESIS Left 05/29/2014   Procedure: Left Triple Arthrodesis, Possible Achilles Lengthening;  Surgeon: Eldred Manges, MD;  Location: [facility] OR;  Service: Orthopedics;  Laterality: Left;  . FOOT ARTHRODESIS Right 01/10/2015   Procedure: Right Triple Arthrodesis, Achilles Lengthening;  Surgeon: Eldred Manges, MD;  Location[facility];  Service: Orthopedics;  Laterality: Right;  . FOOT ARTHRODESIS, TRIPLE Left 05/29/2014  . HARDWARE REMOVAL Left 01/10/2015   Procedure: Left Foot Screw Removal ;  Surgeon: Eldred Manges, MD;  Location: [facility] OR;  Service: Orthopedics;  Laterality: Left;  . TOTAL HIP ARTHROPLASTY Left 03/02/2013   DR [name]  . TOTAL HIP ARTHROPLASTY Left 03/02/2013   Procedure: TOTAL HIP ARTHROPLASTY ANTERIOR APPROACH;  Surgeon: Eldred Manges, MD;  Location: [facility] OR;  Service: Orthopedics;  Laterality: Left;  Left Total Hip Arthroplasty Direct Anterior Approach   Social History   Occupational History  . Not on file  Tobacco Use  . Smoking status: Former Smoker    Types: Cigars, Cigarettes  . Smokeless tobacco: Current User    Types: Snuff  . Tobacco comment: Quit smoking 1998  Substance and Sexual Activity  . Alcohol use: Yes    Alcohol/week: 1.2 oz    Types: 2 Shots of liquor per week    Comment: once a week   . Drug use: No  . Sexual activity: Not on file

## 2017-02-07 ENCOUNTER — Encounter (INDEPENDENT_AMBULATORY_CARE_PROVIDER_SITE_OTHER): Payer: Self-pay | Admitting: Orthopaedic Surgery

## 2017-02-07 DIAGNOSIS — Z96641 Presence of right artificial hip joint: Secondary | ICD-10-CM | POA: Insufficient documentation

## 2017-03-03 DIAGNOSIS — I10 Essential (primary) hypertension: Secondary | ICD-10-CM | POA: Diagnosis not present

## 2017-03-03 DIAGNOSIS — E1165 Type 2 diabetes mellitus with hyperglycemia: Secondary | ICD-10-CM | POA: Diagnosis not present

## 2017-03-03 DIAGNOSIS — E7800 Pure hypercholesterolemia, unspecified: Secondary | ICD-10-CM | POA: Diagnosis not present

## 2017-03-08 ENCOUNTER — Encounter (HOSPITAL_COMMUNITY): Payer: Self-pay | Admitting: Orthopedic Surgery

## 2017-03-09 ENCOUNTER — Encounter (HOSPITAL_COMMUNITY)
Admission: RE | Admit: 2017-03-09 | Discharge: 2017-03-09 | Disposition: A | Discharge status: Home / Self Care | Payer: BLUE CROSS/BLUE SHIELD | Source: Ambulatory Visit | Attending: Orthopaedic Surgery | Admitting: Orthopaedic Surgery

## 2017-03-09 ENCOUNTER — Other Ambulatory Visit: Payer: Self-pay

## 2017-03-09 DIAGNOSIS — Z01810 Encounter for preprocedural cardiovascular examination: Secondary | ICD-10-CM | POA: Diagnosis not present

## 2017-03-09 LAB — GLUCOSE, CAPILLARY: GLUCOSE-CAPILLARY: 214 mg/dL — AB (ref 65–99)

## 2017-03-09 NOTE — Progress Notes (Signed)
Per patient he just received call from Dr. Ophelia CharterYates office.  Due to A1C of 9, surgery would need postponed.

## 2017-03-18 ENCOUNTER — Encounter (HOSPITAL_COMMUNITY): Admission: RE | Payer: Self-pay | Source: Ambulatory Visit

## 2017-03-18 ENCOUNTER — Inpatient Hospital Stay (HOSPITAL_COMMUNITY)
Admission: RE | Admit: 2017-03-18 | Payer: BLUE CROSS/BLUE SHIELD | Source: Ambulatory Visit | Admitting: Orthopaedic Surgery

## 2017-03-18 SURGERY — ARTHROPLASTY, HIP, TOTAL, ANTERIOR APPROACH
Anesthesia: General | Site: Hip | Laterality: Right

## 2017-04-01 ENCOUNTER — Inpatient Hospital Stay (INDEPENDENT_AMBULATORY_CARE_PROVIDER_SITE_OTHER): Payer: BLUE CROSS/BLUE SHIELD | Admitting: Orthopaedic Surgery

## 2017-04-21 DIAGNOSIS — E1165 Type 2 diabetes mellitus with hyperglycemia: Secondary | ICD-10-CM | POA: Diagnosis not present

## 2017-04-26 NOTE — Pre-Procedure Instructions (Signed)
Fraser Busche [facility]  04/26/2017      [facility] - [place], [place] - [address] [place] [address] Phone: [phone] Fax: [phone]    Your procedure is scheduled on Fri., May 06, 2017  Report to [facility] Admitting Entrance "A" at 5:30AM  Call this number if you have problems the morning of surgery:  [phone]   Remember:  Do not eat food or drink liquids after midnight.  Take these medicines the morning of surgery with A SIP OF WATER: Carvedilol (COREG)    Follow your doctor's instruction regarding Aspirin.  7 days before surgery (April 5), stop taking all Aspirins, Vitamins, Fish oils, and Herbal medications. Also stop all NSAIDS i.e. Advil, Ibuprofen, Motrin, Aleve, Anaprox, Naproxen, BC and Goody Powders.  How to Manage Your Diabetes Before and After Surgery  Why is it important to control my blood sugar before and after surgery? . Improving blood sugar levels before and after surgery helps healing and can limit problems. . A way of improving blood sugar control is eating a healthy diet by: o  Eating less sugar and carbohydrates o  Increasing activity/exercise o  Talking with your doctor about reaching your blood sugar goals . High blood sugars (greater than 180 mg/dL) can raise your risk of infections and slow your recovery, so you will need to focus on controlling your diabetes during the weeks before surgery. . Make sure that the doctor who takes care of your diabetes knows about your planned surgery including the date and location.  How do I manage my blood sugar before surgery? . Check your blood sugar at least 4 times a day, starting 2 days before surgery, to make sure that the level is not too high or low. o Check your blood sugar the morning of your surgery when you wake up and every 2 hours until you get to the Short Stay unit. . If your blood sugar is less than 70 mg/dL, you will need to treat for low  blood sugar: o Do not take insulin. o Treat a low blood sugar (less than 70 mg/dL) with  cup of clear juice (cranberry or apple), 4 glucose tablets, OR glucose gel. Recheck blood sugar in 15 minutes after treatment (to make sure it is greater than 70 mg/dL). If your blood sugar is not greater than 70 mg/dL on recheck, call [phone] o  for further instructions. . Report your blood sugar to the short stay nurse when you get to Short Stay.  . If you are admitted to the hospital after surgery: o Your blood sugar will be checked by the staff and you will probably be given insulin after surgery (instead of oral diabetes medicines) to make sure you have good blood sugar levels. o The goal for blood sugar control after surgery is 80-180 mg/dL.  WHAT DO I DO ABOUT MY DIABETES MEDICATION?  Marland Kitchen Do not take Glimepiride (AMARYL) and SitaGLIPtin-metformin (JANUMET) the morning of surgery.  . THE NIGHT BEFORE SURGERY, take ____35_______ units of ______Lantus_____insulin.      . THE MORNING OF SURGERY, take ________22_____ units of ______Lantus____insulin.   Do not wear jewelry.  Do not wear lotions, powders, colognes, or deodorant.  Do not shave 48 hours prior to surgery.  Men may shave face.  Do not bring valuables to the hospital.  [facility] is not responsible for any belongings or valuables.  Contacts, dentures or bridgework may not be worn into surgery.  Leave your suitcase in the car.  After surgery it may be brought to your room.  For patients admitted to the hospital, discharge time will be determined by your treatment team.  Patients discharged the day of surgery will not be allowed to drive home.   Special instructions:  [place]- Preparing For Surgery  Before surgery, you can play an important role. Because skin is not sterile, your skin needs to be as free of germs as possible. You can reduce the number of germs on your skin by washing with CHG (chlorahexidine gluconate) Soap  before surgery.  CHG is an antiseptic cleaner which kills germs and bonds with the skin to continue killing germs even after washing.  Please do not use if you have an allergy to CHG or antibacterial soaps. If your skin becomes reddened/irritated stop using the CHG.  Do not shave (including legs and underarms) for at least 48 hours prior to first CHG shower. It is OK to shave your face.  Please follow these instructions carefully.   1. Shower the NIGHT BEFORE SURGERY and the MORNING OF SURGERY with CHG.   2. If you chose to wash your hair, wash your hair first as usual with your normal shampoo.  3. After you shampoo, rinse your hair and body thoroughly to remove the shampoo.  4. Use CHG as you would any other liquid soap. You can apply CHG directly to the skin and wash gently with a scrungie or a clean washcloth.   5. Apply the CHG Soap to your body ONLY FROM THE NECK DOWN.  Do not use on open wounds or open sores. Avoid contact with your eyes, ears, mouth and genitals (private parts). Wash Face and genitals (private parts)  with your normal soap.  6. Wash thoroughly, paying special attention to the area where your surgery will be performed.  7. Thoroughly rinse your body with warm water from the neck down.  8. DO NOT shower/wash with your normal soap after using and rinsing off the CHG Soap.  9. Pat yourself dry with a CLEAN TOWEL.  10. Wear CLEAN PAJAMAS to bed the night before surgery, wear comfortable clothes the morning of surgery  11. Place CLEAN SHEETS on your bed the night of your first shower and DO NOT SLEEP WITH PETS.  Day of Surgery: Do not apply any deodorants/lotions. Please wear clean clothes to the hospital/surgery center.    Please read over the following fact sheets that you were given. Pain Booklet, Coughing and Deep Breathing, Total Joint Packet, MRSA Information and Surgical Site Infection Prevention

## 2017-04-27 ENCOUNTER — Encounter (HOSPITAL_COMMUNITY): Payer: Self-pay

## 2017-04-27 ENCOUNTER — Ambulatory Visit (HOSPITAL_COMMUNITY)
Admission: RE | Admit: 2017-04-27 | Discharge: 2017-04-27 | Disposition: A | Discharge status: Home / Self Care | Payer: BLUE CROSS/BLUE SHIELD | Source: Ambulatory Visit | Attending: Surgery | Admitting: Surgery

## 2017-04-27 ENCOUNTER — Encounter (HOSPITAL_COMMUNITY)
Admission: RE | Admit: 2017-04-27 | Discharge: 2017-04-27 | Disposition: A | Discharge status: Home / Self Care | Payer: BLUE CROSS/BLUE SHIELD | Source: Ambulatory Visit | Attending: Orthopaedic Surgery | Admitting: Orthopaedic Surgery

## 2017-04-27 ENCOUNTER — Other Ambulatory Visit: Payer: Self-pay

## 2017-04-27 ENCOUNTER — Other Ambulatory Visit (INDEPENDENT_AMBULATORY_CARE_PROVIDER_SITE_OTHER): Payer: Self-pay

## 2017-04-27 DIAGNOSIS — Z01818 Encounter for other preprocedural examination: Secondary | ICD-10-CM | POA: Diagnosis not present

## 2017-04-27 DIAGNOSIS — J9811 Atelectasis: Secondary | ICD-10-CM | POA: Insufficient documentation

## 2017-04-27 LAB — URINALYSIS, ROUTINE W REFLEX MICROSCOPIC
BILIRUBIN URINE: NEGATIVE
Glucose, UA: NEGATIVE mg/dL
KETONES UR: NEGATIVE mg/dL
LEUKOCYTES UA: NEGATIVE
NITRITE: NEGATIVE
PH: 6 (ref 5.0–8.0)
Protein, ur: NEGATIVE mg/dL
Specific Gravity, Urine: 1.018 (ref 1.005–1.030)
Squamous Epithelial / LPF: NONE SEEN

## 2017-04-27 LAB — COMPREHENSIVE METABOLIC PANEL
ALBUMIN: 4 g/dL (ref 3.5–5.0)
ALK PHOS: 74 U/L (ref 38–126)
ALT: 32 U/L (ref 17–63)
ANION GAP: 11 (ref 5–15)
AST: 26 U/L (ref 15–41)
BILIRUBIN TOTAL: 0.5 mg/dL (ref 0.3–1.2)
BUN: 24 mg/dL — ABNORMAL HIGH (ref 6–20)
CALCIUM: 9.6 mg/dL (ref 8.9–10.3)
CO2: 28 mmol/L (ref 22–32)
Chloride: 98 mmol/L — ABNORMAL LOW (ref 101–111)
Creatinine, Ser: 1.11 mg/dL (ref 0.61–1.24)
GFR calc non Af Amer: >60 mL/min (ref >60)
Glucose, Bld: 145 mg/dL — ABNORMAL HIGH (ref 65–99)
POTASSIUM: 3.9 mmol/L (ref 3.5–5.1)
SODIUM: 137 mmol/L (ref 135–145)
TOTAL PROTEIN: 7.3 g/dL (ref 6.5–8.1)

## 2017-04-27 LAB — CBC
HCT: 44.9 % (ref 39.0–52.0)
HEMOGLOBIN: 14.8 g/dL (ref 13.0–17.0)
MCH: 29.8 pg (ref 26.0–34.0)
MCHC: 33 g/dL (ref 30.0–36.0)
MCV: 90.3 fL (ref 78.0–100.0)
Platelets: 240 10*3/uL (ref 150–400)
RBC: 4.97 MIL/uL (ref 4.22–5.81)
RDW: 13 % (ref 11.5–15.5)
WBC: 12.7 10*3/uL — ABNORMAL HIGH (ref 4.0–10.5)

## 2017-04-27 LAB — SURGICAL PCR SCREEN
MRSA, PCR: NEGATIVE
Staphylococcus aureus: POSITIVE — AB

## 2017-04-27 LAB — GLUCOSE, CAPILLARY: GLUCOSE-CAPILLARY: 145 mg/dL — AB (ref 65–99)

## 2017-04-27 NOTE — Progress Notes (Signed)
PCP - Dr. Rolm GalaHeidi Grandis- [facility]  Cardiologist - Denies  Endocrin- Dr. Talmage NapBalan  Chest x-ray - 04/27/17  EKG - 03/09/17 (E)  Stress Test - Denies  ECHO - Denies  Cardiac Cath - Denies  Sleep Study - Yes- Positive CPAP - Yes-Told to bring mask dos  LABS- 04/27/17: CBC, CMP  HA1C- Requested Fasting Blood Sugar - 63-110, Today 145 Checks Blood Sugar __2___ times a day  Pt told to continue taking Aspirin  Anesthesia- No  Pt denies having chest pain, sob, or fever at this time. All instructions explained to the pt, with a verbal understanding of the material. Pt agrees to go over the instructions while at home for a better understanding. The opportunity to ask questions was provided.

## 2017-04-28 ENCOUNTER — Ambulatory Visit (INDEPENDENT_AMBULATORY_CARE_PROVIDER_SITE_OTHER): Payer: BLUE CROSS/BLUE SHIELD | Admitting: Surgery

## 2017-04-28 ENCOUNTER — Encounter (INDEPENDENT_AMBULATORY_CARE_PROVIDER_SITE_OTHER): Payer: Self-pay | Admitting: Surgery

## 2017-04-28 DIAGNOSIS — M1611 Unilateral primary osteoarthritis, right hip: Secondary | ICD-10-CM

## 2017-04-28 NOTE — Progress Notes (Signed)
Patient with history of end-stage DJD right hip comes in for preop H&P.  We have received preop medical clearance.  Full H&P is placed in patient's chart.

## 2017-04-28 NOTE — H&P (Signed)
TOTAL HIP ADMISSION H&P  Patient is admitted for right total hip arthroplasty.  Subjective:  Chief Complaint: right hip pain  HPI: [name] [name], 57 y.o. male, has a history of pain and functional disability in the right hip(s) due to arthritis and patient has failed non-surgical conservative treatments for greater than 12 weeks to include NSAID's and/or analgesics, use of assistive devices, weight reduction as appropriate and activity modification.  Onset of symptoms was gradual starting 10 years ago with gradually worsening course since that time. Patient has worsening of pain with activity and weight bearing, trendelenberg gait, crepitus and joint swelling. Patient has evidence of subchondral sclerosis, periarticular osteophytes and joint space narrowing by imaging studies. This condition presents safety issues increasing the risk of falls. .  There is no current active infection.  Patient Active Problem List   Diagnosis Date Noted  . Hx of total hip arthroplasty, left 02/07/2017  . Arthrodesis status 01/10/2015  . Postoperative fever 05/31/2014  . Acute respiratory failure with hypoxia (HCC) 05/31/2014  . Status post arthrodesis 05/29/2014  . Unilateral primary osteoarthritis, right hip 03/02/2013   Past Medical History:  Diagnosis Date  . Diabetes mellitus without complication (HCC)    type 2   . Heart murmur    Hx: of as achild  . Hypertension   . Inguinal hernia    Hx: of left groin  . Kidney stones    Hx: of  . OA (osteoarthritis) of hip    Hx: of  . Sleep apnea    Uses CPAP    Past Surgical History:  Procedure Laterality Date  . COLONOSCOPY     Hx: of  . FOOT ARTHRODESIS Left 05/29/2014   Procedure: Left Triple Arthrodesis, Possible Achilles Lengthening;  Surgeon: Eldred Manges, MD;  Location: [facility] OR;  Service: Orthopedics;  Laterality: Left;  . FOOT ARTHRODESIS Right 01/10/2015   Procedure: Right Triple Arthrodesis, Achilles Lengthening;  Surgeon: Eldred Manges,  MD;  Location: [facility] OR;  Service: Orthopedics;  Laterality: Right;  . FOOT ARTHRODESIS, TRIPLE Left 05/29/2014  . HARDWARE REMOVAL Left 01/10/2015   Procedure: Left Foot Screw Removal ;  Surgeon: Eldred Manges, MD;  Location[facility] OR;  Service: Orthopedics;  Laterality: Left;  . TOTAL HIP ARTHROPLASTY Left 03/02/2013   DR [name]  . TOTAL HIP ARTHROPLASTY Left 03/02/2013   Procedure: TOTAL HIP ARTHROPLASTY ANTERIOR APPROACH;  Surgeon: Eldred Manges, MD;  Location: [facility] OR;  Service: Orthopedics;  Laterality: Left;  Left Total Hip Arthroplasty Direct Anterior Approach    No current facility-administered medications for this encounter.    Current Outpatient Medications  Medication Sig Dispense Refill Last Dose  . Ascorbic Acid (VITAMIN C) 1000 MG tablet Take 1,000 mg by mouth daily.   Taking  . aspirin EC 81 MG tablet Take 81 mg by mouth daily.   Taking  . atorvastatin (LIPITOR) 10 MG tablet Take 10 mg by mouth daily.   Taking  . b complex vitamins tablet Take 1 tablet by mouth daily.   Taking  . carvedilol (COREG) 6.25 MG tablet Take 6.25 mg by mouth 2 (two) times daily with a meal.   Taking  . Cinnamon 500 MG TABS Take 1,000 mg by mouth daily.   Taking  . glimepiride (AMARYL) 4 MG tablet Take 4 mg by mouth 2 (two) times daily with a meal.    Taking  . hydrochlorothiazide (HYDRODIURIL) 25 MG tablet Take 25 mg by mouth daily.    Taking  .  losartan (COZAAR) 100 MG tablet Take 100 mg by mouth daily.   Taking  . sitaGLIPtin-metformin (JANUMET) 50-1000 MG tablet Take 1 tablet by mouth 2 (two) times daily.   Taking  . CONTOUR NEXT TEST test strip   2 Taking  . glucose blood ([employer] TEST STRIPS) test strip Check blood sugar three times daily.  250.02  One touch ultra or other test strip of patient's choice.   Taking  . Insulin Pen Needle (B-D ULTRAFINE III SHORT PEN) 31G X 8 MM MISC Inject insulin once daily.   Taking  . LANTUS SOLOSTAR 100 UNIT/ML Solostar Pen Inject 45-70 Units into the skin 2 (two) times  daily. Inject 45 units in the morning & 70 units in the evening  2 Taking   Allergies  Allergen Reactions  . Shellfish Allergy Anaphylaxis  . Penicillins Hives    1977 Has patient had a PCN reaction causing immediate rash, facial/tongue/throat swelling, SOB or lightheadedness with hypotension: Yes Has patient had a PCN reaction causing severe rash involving mucus membranes or skin necrosis: No Has patient had a PCN reaction that required hospitalization No Has patient had a PCN reaction occurring within the last 10 years: No If all of the above answers are "NO", then may proceed with Cephalosporin use.     Social History   Tobacco Use  . Smoking status: Former Smoker    Types: Cigars, Cigarettes  . Smokeless tobacco: Current User    Types: Snuff  . Tobacco comment: Quit smoking 1998  Substance Use Topics  . Alcohol use: Yes    Alcohol/week: 1.2 oz    Types: 2 Shots of liquor per week    Comment: once a week     Family History  Problem Relation Age of Onset  . Diabetes Mother   . Hypertension Mother   . Cancer - Other Mother   . Cancer - Lung Father   . Arthritis Sister   . Heart disease Sister      Review of Systems  Constitutional: Negative.   HENT: Negative.   Eyes: Negative.   Respiratory: Negative.   Cardiovascular: Negative.   Gastrointestinal: Negative.   Genitourinary: Negative.   Musculoskeletal: Positive for joint pain.  Skin: Negative.   Neurological: Negative.   Psychiatric/Behavioral: Negative.     Objective:  Physical Exam  Constitutional: He is oriented to person, place, and time. He appears well-developed. No distress.  HENT:  Head: Normocephalic and atraumatic.  Eyes: Pupils are equal, round, and reactive to light. EOM are normal.  Neck: Normal range of motion.  Cardiovascular: Normal rate.  Respiratory: Effort normal and breath sounds normal. No respiratory distress. He has no wheezes.  GI: Bowel sounds are normal. There is no tenderness.   Musculoskeletal:  Gait antalgic. Marked right hip pain with about 5-10 degrees internal/external rotation  Neurological: He is alert and oriented to person, place, and time.  Skin: Skin is warm and dry.  Psychiatric: He has a normal mood and affect.    Vital signs in last 24 hours:    Labs:   Estimated body mass index is 43.81 kg/m as calculated from the following:   Height as of 04/27/17: 6\' 2"  (1.88 m).   Weight as of 04/27/17: 341 lb 4 oz (154.8 kg).   Imaging Review Plain radiographs demonstrate moderate degenerative joint disease of the right hip(s). The bone quality appears to be good for age and reported activity level.    Preoperative templating of the joint replacement has  been completed, documented, and submitted to the Operating Room personnel in order to optimize intra-operative equipment management.       Assessment/Plan:  End stage arthritis, right hip(s)  The patient history, physical examination, clinical judgement of the provider and imaging studies are consistent with end stage degenerative joint disease of the right hip(s) and total hip arthroplasty is deemed medically necessary. The treatment options including medical management, injection therapy, arthroscopy and arthroplasty were discussed at length. The risks and benefits of total hip arthroplasty were presented and reviewed. The risks due to aseptic loosening, infection, stiffness, dislocation/subluxation,  thromboembolic complications and other imponderables were discussed.  The patient acknowledged the explanation, agreed to proceed with the plan and consent was signed. Patient is being admitted for inpatient treatment for surgery, pain control, PT, OT, prophylactic antibiotics, VTE prophylaxis, progressive ambulation and ADL's and discharge planning.The patient is planning to be discharged home with home health services

## 2017-04-28 NOTE — Progress Notes (Signed)
Patient gave in structions to left message about preception for nasl swab results on voice message.  I left a message and instructed [id number] to pick prescription up.  CXR showed bibasilar atelectasis, I informed Rica MastAngela Kabbe, NP-C, she instructed me to tell patient to take practice taking deep breaths. I left instruction on deep breathing on voice message also , with my casll back number for any question.

## 2017-04-29 ENCOUNTER — Inpatient Hospital Stay (INDEPENDENT_AMBULATORY_CARE_PROVIDER_SITE_OTHER): Payer: BLUE CROSS/BLUE SHIELD | Admitting: Orthopaedic Surgery

## 2017-05-02 DIAGNOSIS — G4733 Obstructive sleep apnea (adult) (pediatric): Secondary | ICD-10-CM | POA: Diagnosis not present

## 2017-05-05 MED ORDER — VANCOMYCIN HCL 10 G IV SOLR
1500.0000 mg | INTRAVENOUS | Status: AC
  Administered 2017-05-06: 1500 mg via INTRAVENOUS
  Filled 2017-05-05: qty 1500

## 2017-05-05 NOTE — Anesthesia Preprocedure Evaluation (Addendum)
Anesthesia Evaluation  Patient identified by MRN, date of birth, ID band Patient awake    Reviewed: Allergy & Precautions, NPO status , Patient's Chart, lab work & pertinent test results  Airway Mallampati: II  TM Distance: >3 FB Neck ROM: Full    Dental   Pulmonary sleep apnea , former smoker,    Pulmonary exam normal        Cardiovascular hypertension, Pt. on medications Normal cardiovascular exam+ Valvular Problems/Murmurs  Rhythm:Regular     Neuro/Psych    GI/Hepatic   Endo/Other  diabetes, Type 2, Oral Hypoglycemic AgentsMorbid obesity  Renal/GU      Musculoskeletal  (+) Arthritis ,   Abdominal (+) + obese,   Peds  Hematology   Anesthesia Other Findings   Reproductive/Obstetrics                           Anesthesia Physical  Anesthesia Plan  ASA: III  Anesthesia Plan: General   Post-op Pain Management:    Induction: Intravenous  PONV Risk Score and Plan: 3 and Ondansetron, Dexamethasone and Midazolam  Airway Management Planned: Oral ETT  Additional Equipment:   Intra-op Plan:   Post-operative Plan: Extubation in OR  Informed Consent: I have reviewed the patients History and Physical, chart, labs and discussed the procedure including the risks, benefits and alternatives for the proposed anesthesia with the patient or authorized representative who has indicated his/her understanding and acceptance.   Dental advisory given  Plan Discussed with: CRNA  Anesthesia Plan Comments:       Anesthesia Quick Evaluation

## 2017-05-06 ENCOUNTER — Inpatient Hospital Stay (HOSPITAL_COMMUNITY)
Admission: RE | Admit: 2017-05-06 | Discharge: 2017-05-08 | DRG: 470 | Disposition: A | Discharge status: Home / Self Care | Payer: BLUE CROSS/BLUE SHIELD | Source: Ambulatory Visit | Attending: Orthopaedic Surgery | Admitting: Orthopaedic Surgery

## 2017-05-06 ENCOUNTER — Inpatient Hospital Stay (HOSPITAL_COMMUNITY): Payer: BLUE CROSS/BLUE SHIELD

## 2017-05-06 ENCOUNTER — Inpatient Hospital Stay (HOSPITAL_COMMUNITY): Payer: BLUE CROSS/BLUE SHIELD | Admitting: Anesthesiology

## 2017-05-06 ENCOUNTER — Encounter (HOSPITAL_COMMUNITY)
Admission: RE | Disposition: A | Discharge status: Home / Self Care | Payer: Self-pay | Source: Ambulatory Visit | Attending: Orthopaedic Surgery

## 2017-05-06 ENCOUNTER — Other Ambulatory Visit: Payer: Self-pay

## 2017-05-06 ENCOUNTER — Encounter (HOSPITAL_COMMUNITY): Payer: Self-pay | Admitting: General Practice

## 2017-05-06 DIAGNOSIS — Z8261 Family history of arthritis: Secondary | ICD-10-CM

## 2017-05-06 DIAGNOSIS — Z96641 Presence of right artificial hip joint: Secondary | ICD-10-CM | POA: Diagnosis not present

## 2017-05-06 DIAGNOSIS — Z880 Allergy status to penicillin: Secondary | ICD-10-CM | POA: Diagnosis not present

## 2017-05-06 DIAGNOSIS — Z6841 Body Mass Index (BMI) 40.0 and over, adult: Secondary | ICD-10-CM | POA: Diagnosis not present

## 2017-05-06 DIAGNOSIS — Z981 Arthrodesis status: Secondary | ICD-10-CM

## 2017-05-06 DIAGNOSIS — I10 Essential (primary) hypertension: Secondary | ICD-10-CM | POA: Diagnosis not present

## 2017-05-06 DIAGNOSIS — E119 Type 2 diabetes mellitus without complications: Secondary | ICD-10-CM | POA: Diagnosis not present

## 2017-05-06 DIAGNOSIS — Z419 Encounter for procedure for purposes other than remedying health state, unspecified: Secondary | ICD-10-CM

## 2017-05-06 DIAGNOSIS — Z833 Family history of diabetes mellitus: Secondary | ICD-10-CM | POA: Diagnosis not present

## 2017-05-06 DIAGNOSIS — G4730 Sleep apnea, unspecified: Secondary | ICD-10-CM | POA: Diagnosis not present

## 2017-05-06 DIAGNOSIS — Z87891 Personal history of nicotine dependence: Secondary | ICD-10-CM | POA: Diagnosis not present

## 2017-05-06 DIAGNOSIS — M1611 Unilateral primary osteoarthritis, right hip: Principal | ICD-10-CM | POA: Diagnosis present

## 2017-05-06 DIAGNOSIS — Z91013 Allergy to seafood: Secondary | ICD-10-CM

## 2017-05-06 DIAGNOSIS — Z87442 Personal history of urinary calculi: Secondary | ICD-10-CM

## 2017-05-06 DIAGNOSIS — Z8249 Family history of ischemic heart disease and other diseases of the circulatory system: Secondary | ICD-10-CM

## 2017-05-06 DIAGNOSIS — M25551 Pain in right hip: Secondary | ICD-10-CM | POA: Diagnosis present

## 2017-05-06 DIAGNOSIS — Z96642 Presence of left artificial hip joint: Secondary | ICD-10-CM | POA: Diagnosis present

## 2017-05-06 DIAGNOSIS — Z794 Long term (current) use of insulin: Secondary | ICD-10-CM

## 2017-05-06 DIAGNOSIS — Z471 Aftercare following joint replacement surgery: Secondary | ICD-10-CM | POA: Diagnosis not present

## 2017-05-06 DIAGNOSIS — Z7982 Long term (current) use of aspirin: Secondary | ICD-10-CM

## 2017-05-06 DIAGNOSIS — Z09 Encounter for follow-up examination after completed treatment for conditions other than malignant neoplasm: Secondary | ICD-10-CM

## 2017-05-06 DIAGNOSIS — J9601 Acute respiratory failure with hypoxia: Secondary | ICD-10-CM | POA: Diagnosis not present

## 2017-05-06 HISTORY — PX: TOTAL HIP ARTHROPLASTY: SHX124

## 2017-05-06 LAB — GLUCOSE, CAPILLARY
GLUCOSE-CAPILLARY: 94 mg/dL (ref 65–99)
GLUCOSE-CAPILLARY: 212 mg/dL — AB (ref 65–99)
Glucose-Capillary: 136 mg/dL — ABNORMAL HIGH (ref 65–99)
Glucose-Capillary: 120 mg/dL — ABNORMAL HIGH (ref 65–99)
Glucose-Capillary: 262 mg/dL — ABNORMAL HIGH (ref 65–99)

## 2017-05-06 SURGERY — ARTHROPLASTY, HIP, TOTAL, ANTERIOR APPROACH
Anesthesia: General | Site: Hip | Laterality: Right

## 2017-05-06 MED ORDER — CHLORHEXIDINE GLUCONATE 4 % EX LIQD: 60.0000 mL | Freq: Once | CUTANEOUS | Status: DC

## 2017-05-06 MED ORDER — MEPERIDINE HCL 50 MG/ML IJ SOLN: 6.2500 mg | INTRAMUSCULAR | Status: DC | PRN

## 2017-05-06 MED ORDER — METOCLOPRAMIDE HCL 5 MG/ML IJ SOLN: 5.0000 mg | Freq: Three times a day (TID) | INTRAMUSCULAR | Status: DC | PRN

## 2017-05-06 MED ORDER — KETOROLAC TROMETHAMINE 30 MG/ML IJ SOLN: 30.0000 mg | Freq: Once | INTRAMUSCULAR | Status: DC | PRN

## 2017-05-06 MED ORDER — METHOCARBAMOL 1000 MG/10ML IJ SOLN
500.0000 mg | Freq: Four times a day (QID) | INTRAVENOUS | Status: DC | PRN
  Filled 2017-05-06: qty 5

## 2017-05-06 MED ORDER — VANCOMYCIN HCL IN DEXTROSE 1-5 GM/200ML-% IV SOLN
1000.0000 mg | Freq: Two times a day (BID) | INTRAVENOUS | Status: AC
  Administered 2017-05-06: 1000 mg via INTRAVENOUS
  Filled 2017-05-06: qty 200

## 2017-05-06 MED ORDER — DEXAMETHASONE SODIUM PHOSPHATE 10 MG/ML IJ SOLN
INTRAMUSCULAR | Status: AC
  Filled 2017-05-06: qty 1

## 2017-05-06 MED ORDER — BUPIVACAINE HCL (PF) 0.25 % IJ SOLN
INTRAMUSCULAR | Status: AC
  Filled 2017-05-06: qty 30

## 2017-05-06 MED ORDER — METOCLOPRAMIDE HCL 5 MG PO TABS: 5.0000 mg | ORAL_TABLET | Freq: Three times a day (TID) | ORAL | Status: DC | PRN

## 2017-05-06 MED ORDER — DOCUSATE SODIUM 100 MG PO CAPS
100.0000 mg | ORAL_CAPSULE | Freq: Two times a day (BID) | ORAL | Status: DC
  Administered 2017-05-06 – 2017-05-08 (×4): 100 mg via ORAL
  Filled 2017-05-06 (×4): qty 1

## 2017-05-06 MED ORDER — INSULIN GLARGINE 100 UNIT/ML ~~LOC~~ SOLN
70.0000 [IU] | Freq: Every day | SUBCUTANEOUS | Status: DC
  Administered 2017-05-06 – 2017-05-07 (×2): 70 [IU] via SUBCUTANEOUS
  Filled 2017-05-06 (×2): qty 0.7

## 2017-05-06 MED ORDER — HYDROCHLOROTHIAZIDE 25 MG PO TABS
25.0000 mg | ORAL_TABLET | Freq: Every day | ORAL | Status: DC
  Administered 2017-05-06 – 2017-05-08 (×3): 25 mg via ORAL
  Filled 2017-05-06 (×3): qty 1

## 2017-05-06 MED ORDER — ONDANSETRON HCL 4 MG/2ML IJ SOLN
INTRAMUSCULAR | Status: AC
  Filled 2017-05-06: qty 2

## 2017-05-06 MED ORDER — PROPOFOL 10 MG/ML IV BOLUS
INTRAVENOUS | Status: DC | PRN
  Administered 2017-05-06: 200 mg via INTRAVENOUS

## 2017-05-06 MED ORDER — ONDANSETRON HCL 4 MG PO TABS: 4.0000 mg | ORAL_TABLET | Freq: Four times a day (QID) | ORAL | Status: DC | PRN

## 2017-05-06 MED ORDER — GLIMEPIRIDE 4 MG PO TABS
4.0000 mg | ORAL_TABLET | Freq: Two times a day (BID) | ORAL | Status: DC
  Administered 2017-05-06 – 2017-05-08 (×4): 4 mg via ORAL
  Filled 2017-05-06 (×6): qty 1

## 2017-05-06 MED ORDER — OXYCODONE HCL 5 MG PO TABS
5.0000 mg | ORAL_TABLET | ORAL | Status: DC | PRN
  Administered 2017-05-06 – 2017-05-08 (×8): 10 mg via ORAL
  Filled 2017-05-06 (×7): qty 2

## 2017-05-06 MED ORDER — BUPIVACAINE HCL (PF) 0.25 % IJ SOLN
INTRAMUSCULAR | Status: DC | PRN
Start: 2017-05-06 — End: 2017-05-06
  Administered 2017-05-06: 20 mL

## 2017-05-06 MED ORDER — HYDROMORPHONE HCL 1 MG/ML IJ SOLN
0.2500 mg | INTRAMUSCULAR | Status: DC | PRN
  Administered 2017-05-06 (×2): 0.5 mg via INTRAVENOUS

## 2017-05-06 MED ORDER — BUPIVACAINE LIPOSOME 1.3 % IJ SUSP
20.0000 mL | INTRAMUSCULAR | Status: AC
  Administered 2017-05-06: 20 mL
  Filled 2017-05-06: qty 20

## 2017-05-06 MED ORDER — SUGAMMADEX SODIUM 500 MG/5ML IV SOLN
INTRAVENOUS | Status: DC | PRN
  Administered 2017-05-06: 200 mg via INTRAVENOUS

## 2017-05-06 MED ORDER — PHENYLEPHRINE 40 MCG/ML (10ML) SYRINGE FOR IV PUSH (FOR BLOOD PRESSURE SUPPORT)
PREFILLED_SYRINGE | INTRAVENOUS | Status: DC | PRN
  Administered 2017-05-06 (×3): 80 ug via INTRAVENOUS

## 2017-05-06 MED ORDER — PROMETHAZINE HCL 25 MG/ML IJ SOLN: 6.2500 mg | INTRAMUSCULAR | Status: DC | PRN

## 2017-05-06 MED ORDER — KETOROLAC TROMETHAMINE 30 MG/ML IJ SOLN
INTRAMUSCULAR | Status: AC
  Filled 2017-05-06: qty 1

## 2017-05-06 MED ORDER — HYDROMORPHONE HCL 1 MG/ML IJ SOLN
INTRAMUSCULAR | Status: AC
  Administered 2017-05-06: 0.5 mg via INTRAVENOUS
  Filled 2017-05-06: qty 1

## 2017-05-06 MED ORDER — SUGAMMADEX SODIUM 200 MG/2ML IV SOLN
INTRAVENOUS | Status: AC
  Filled 2017-05-06: qty 2

## 2017-05-06 MED ORDER — SUCCINYLCHOLINE CHLORIDE 20 MG/ML IJ SOLN
INTRAMUSCULAR | Status: DC | PRN
  Administered 2017-05-06: 120 mg via INTRAVENOUS

## 2017-05-06 MED ORDER — ONDANSETRON HCL 4 MG/2ML IJ SOLN: 4.0000 mg | Freq: Four times a day (QID) | INTRAMUSCULAR | Status: DC | PRN

## 2017-05-06 MED ORDER — LOSARTAN POTASSIUM 50 MG PO TABS
100.0000 mg | ORAL_TABLET | Freq: Every day | ORAL | Status: DC
  Administered 2017-05-06 – 2017-05-08 (×3): 100 mg via ORAL
  Filled 2017-05-06 (×3): qty 2

## 2017-05-06 MED ORDER — HYDROMORPHONE HCL 1 MG/ML IJ SOLN: 0.2500 mg | INTRAMUSCULAR | Status: DC | PRN

## 2017-05-06 MED ORDER — ASPIRIN EC 325 MG PO TBEC
325.0000 mg | DELAYED_RELEASE_TABLET | Freq: Every day | ORAL | Status: DC
  Administered 2017-05-07 – 2017-05-08 (×2): 325 mg via ORAL
  Filled 2017-05-06 (×2): qty 1

## 2017-05-06 MED ORDER — ONDANSETRON HCL 4 MG/2ML IJ SOLN
INTRAMUSCULAR | Status: DC | PRN
  Administered 2017-05-06: 4 mg via INTRAVENOUS

## 2017-05-06 MED ORDER — SITAGLIPTIN PHOS-METFORMIN HCL 50-1000 MG PO TABS: 1.0000 | ORAL_TABLET | Freq: Two times a day (BID) | ORAL | Status: DC

## 2017-05-06 MED ORDER — OXYCODONE HCL 5 MG PO TABS
ORAL_TABLET | ORAL | Status: AC
  Filled 2017-05-06: qty 2

## 2017-05-06 MED ORDER — ATORVASTATIN CALCIUM 10 MG PO TABS
10.0000 mg | ORAL_TABLET | Freq: Every day | ORAL | Status: DC
  Administered 2017-05-06 – 2017-05-08 (×3): 10 mg via ORAL
  Filled 2017-05-06 (×3): qty 1

## 2017-05-06 MED ORDER — CARVEDILOL 6.25 MG PO TABS
6.2500 mg | ORAL_TABLET | Freq: Two times a day (BID) | ORAL | Status: DC
  Administered 2017-05-06 – 2017-05-08 (×4): 6.25 mg via ORAL
  Filled 2017-05-06: qty 1
  Filled 2017-05-06: qty 2
  Filled 2017-05-06 (×2): qty 1

## 2017-05-06 MED ORDER — FENTANYL CITRATE (PF) 250 MCG/5ML IJ SOLN
INTRAMUSCULAR | Status: DC | PRN
  Administered 2017-05-06: 150 ug via INTRAVENOUS
  Administered 2017-05-06: 100 ug via INTRAVENOUS
  Administered 2017-05-06: 150 ug via INTRAVENOUS
  Administered 2017-05-06 (×2): 100 ug via INTRAVENOUS

## 2017-05-06 MED ORDER — DEXAMETHASONE SODIUM PHOSPHATE 10 MG/ML IJ SOLN
INTRAMUSCULAR | Status: DC | PRN
  Administered 2017-05-06: 10 mg via INTRAVENOUS

## 2017-05-06 MED ORDER — HYDROMORPHONE HCL 2 MG/ML IJ SOLN
0.5000 mg | INTRAMUSCULAR | Status: DC | PRN
  Administered 2017-05-06: 1 mg via INTRAVENOUS
  Filled 2017-05-06: qty 1

## 2017-05-06 MED ORDER — METHOCARBAMOL 500 MG PO TABS
ORAL_TABLET | ORAL | Status: AC
  Filled 2017-05-06: qty 1

## 2017-05-06 MED ORDER — PHENOL 1.4 % MT LIQD: 1.0000 | OROMUCOSAL | Status: DC | PRN

## 2017-05-06 MED ORDER — MENTHOL 3 MG MT LOZG: 1.0000 | LOZENGE | OROMUCOSAL | Status: DC | PRN

## 2017-05-06 MED ORDER — OXYCODONE HCL 5 MG/5ML PO SOLN: 5.0000 mg | Freq: Once | ORAL | Status: DC | PRN

## 2017-05-06 MED ORDER — INSULIN GLARGINE 100 UNIT/ML ~~LOC~~ SOLN
45.0000 [IU] | Freq: Every day | SUBCUTANEOUS | Status: DC
  Administered 2017-05-07 – 2017-05-08 (×2): 45 [IU] via SUBCUTANEOUS
  Filled 2017-05-06 (×2): qty 0.45

## 2017-05-06 MED ORDER — LIDOCAINE 2% (20 MG/ML) 5 ML SYRINGE
INTRAMUSCULAR | Status: AC
  Filled 2017-05-06: qty 5

## 2017-05-06 MED ORDER — MIDAZOLAM HCL 5 MG/5ML IJ SOLN
INTRAMUSCULAR | Status: DC | PRN
  Administered 2017-05-06: 2 mg via INTRAVENOUS

## 2017-05-06 MED ORDER — SODIUM CHLORIDE 0.9 % IV SOLN
INTRAVENOUS | Status: DC
  Administered 2017-05-06: 15:00:00 via INTRAVENOUS

## 2017-05-06 MED ORDER — LACTATED RINGERS IV SOLN
INTRAVENOUS | Status: DC
  Administered 2017-05-06 (×2): via INTRAVENOUS

## 2017-05-06 MED ORDER — FENTANYL CITRATE (PF) 250 MCG/5ML IJ SOLN
INTRAMUSCULAR | Status: AC
  Filled 2017-05-06: qty 5

## 2017-05-06 MED ORDER — POLYETHYLENE GLYCOL 3350 17 G PO PACK: 17.0000 g | PACK | Freq: Every day | ORAL | Status: DC | PRN

## 2017-05-06 MED ORDER — ALBUMIN HUMAN 5 % IV SOLN
INTRAVENOUS | Status: DC | PRN
  Administered 2017-05-06: 09:00:00 via INTRAVENOUS

## 2017-05-06 MED ORDER — BUPIVACAINE-EPINEPHRINE (PF) 0.25% -1:200000 IJ SOLN
INTRAMUSCULAR | Status: AC
  Filled 2017-05-06: qty 30

## 2017-05-06 MED ORDER — PROPOFOL 10 MG/ML IV BOLUS
INTRAVENOUS | Status: AC
  Filled 2017-05-06: qty 20

## 2017-05-06 MED ORDER — ROCURONIUM BROMIDE 10 MG/ML (PF) SYRINGE
PREFILLED_SYRINGE | INTRAVENOUS | Status: AC
  Filled 2017-05-06: qty 5

## 2017-05-06 MED ORDER — INSULIN ASPART 100 UNIT/ML ~~LOC~~ SOLN
0.0000 [IU] | Freq: Three times a day (TID) | SUBCUTANEOUS | Status: DC
  Administered 2017-05-06: 5 [IU] via SUBCUTANEOUS
  Administered 2017-05-07: 3 [IU] via SUBCUTANEOUS
  Administered 2017-05-07: 5 [IU] via SUBCUTANEOUS
  Administered 2017-05-07: 3 [IU] via SUBCUTANEOUS

## 2017-05-06 MED ORDER — ROCURONIUM BROMIDE 100 MG/10ML IV SOLN
INTRAVENOUS | Status: DC | PRN
  Administered 2017-05-06: 100 mg via INTRAVENOUS

## 2017-05-06 MED ORDER — METHOCARBAMOL 500 MG PO TABS
500.0000 mg | ORAL_TABLET | Freq: Four times a day (QID) | ORAL | Status: DC | PRN
  Administered 2017-05-06 – 2017-05-08 (×6): 500 mg via ORAL
  Filled 2017-05-06 (×5): qty 1

## 2017-05-06 MED ORDER — METFORMIN HCL 500 MG PO TABS
1000.0000 mg | ORAL_TABLET | Freq: Two times a day (BID) | ORAL | Status: DC
  Administered 2017-05-06 – 2017-05-08 (×4): 1000 mg via ORAL
  Filled 2017-05-06 (×4): qty 2

## 2017-05-06 MED ORDER — OXYCODONE HCL 5 MG PO TABS: 5.0000 mg | ORAL_TABLET | Freq: Once | ORAL | Status: DC | PRN

## 2017-05-06 MED ORDER — ACETAMINOPHEN 325 MG PO TABS: 325.0000 mg | ORAL_TABLET | Freq: Four times a day (QID) | ORAL | Status: DC | PRN

## 2017-05-06 MED ORDER — 0.9 % SODIUM CHLORIDE (POUR BTL) OPTIME
TOPICAL | Status: DC | PRN
  Administered 2017-05-06: 1000 mL

## 2017-05-06 MED ORDER — HYDROMORPHONE HCL 1 MG/ML IJ SOLN: 0.5000 mg | INTRAMUSCULAR | Status: DC | PRN

## 2017-05-06 MED ORDER — MIDAZOLAM HCL 2 MG/2ML IJ SOLN
INTRAMUSCULAR | Status: AC
  Filled 2017-05-06: qty 2

## 2017-05-06 MED ORDER — LIDOCAINE HCL (CARDIAC) 20 MG/ML IV SOLN
INTRAVENOUS | Status: DC | PRN
  Administered 2017-05-06: 100 mg via INTRATRACHEAL

## 2017-05-06 MED ORDER — PHENYLEPHRINE 40 MCG/ML (10ML) SYRINGE FOR IV PUSH (FOR BLOOD PRESSURE SUPPORT)
PREFILLED_SYRINGE | INTRAVENOUS | Status: AC
  Filled 2017-05-06: qty 10

## 2017-05-06 MED ORDER — LINAGLIPTIN 5 MG PO TABS
5.0000 mg | ORAL_TABLET | Freq: Every day | ORAL | Status: DC
  Administered 2017-05-06 – 2017-05-08 (×3): 5 mg via ORAL
  Filled 2017-05-06 (×3): qty 1

## 2017-05-06 SURGICAL SUPPLY — 46 items
BENZOIN TINCTURE PRP APPL 2/3 (GAUZE/BANDAGES/DRESSINGS) ×2 IMPLANT
BLADE CLIPPER SURG (BLADE) IMPLANT
BLADE SAW SGTL 18X1.27X75 (BLADE) ×2 IMPLANT
CAPT HIP TOTAL 2 ×2 IMPLANT
CELLS DAT CNTRL 66122 CELL SVR (MISCELLANEOUS) ×1 IMPLANT
COVER SURGICAL LIGHT HANDLE (MISCELLANEOUS) ×2 IMPLANT
DRAPE C-ARM 42X72 X-RAY (DRAPES) ×2 IMPLANT
DRAPE IMP U-DRAPE 54X76 (DRAPES) ×2 IMPLANT
DRAPE STERI IOBAN 125X83 (DRAPES) ×2 IMPLANT
DRAPE U-SHAPE 47X51 STRL (DRAPES) ×6 IMPLANT
DRSG MEPILEX BORDER 4X12 (GAUZE/BANDAGES/DRESSINGS) ×2 IMPLANT
DRSG MEPILEX BORDER 4X8 (GAUZE/BANDAGES/DRESSINGS) ×2 IMPLANT
DURAPREP 26ML APPLICATOR (WOUND CARE) ×2 IMPLANT
ELECT BLADE 4.0 EZ CLEAN MEGAD (MISCELLANEOUS)
ELECT CAUTERY BLADE 6.4 (BLADE) ×2 IMPLANT
ELECT REM PT RETURN 9FT ADLT (ELECTROSURGICAL) ×2
ELECTRODE BLDE 4.0 EZ CLN MEGD (MISCELLANEOUS) IMPLANT
ELECTRODE REM PT RTRN 9FT ADLT (ELECTROSURGICAL) ×1 IMPLANT
FACESHIELD WRAPAROUND (MASK) ×4 IMPLANT
GLOVE BIOGEL PI IND STRL 8 (GLOVE) ×2 IMPLANT
GLOVE BIOGEL PI INDICATOR 8 (GLOVE) ×2
GLOVE ORTHO TXT STRL SZ7.5 (GLOVE) ×4 IMPLANT
GOWN STRL REUS W/ TWL LRG LVL3 (GOWN DISPOSABLE) ×1 IMPLANT
GOWN STRL REUS W/ TWL XL LVL3 (GOWN DISPOSABLE) ×1 IMPLANT
GOWN STRL REUS W/TWL 2XL LVL3 (GOWN DISPOSABLE) ×2 IMPLANT
GOWN STRL REUS W/TWL LRG LVL3 (GOWN DISPOSABLE) ×1
GOWN STRL REUS W/TWL XL LVL3 (GOWN DISPOSABLE) ×1
KIT BASIN OR (CUSTOM PROCEDURE TRAY) ×2 IMPLANT
KIT TURNOVER KIT B (KITS) ×2 IMPLANT
MANIFOLD NEPTUNE II (INSTRUMENTS) ×2 IMPLANT
NS IRRIG 1000ML POUR BTL (IV SOLUTION) ×2 IMPLANT
PACK TOTAL JOINT (CUSTOM PROCEDURE TRAY) ×2 IMPLANT
PAD ARMBOARD 7.5X6 YLW CONV (MISCELLANEOUS) ×4 IMPLANT
RTRCTR WOUND ALEXIS 18CM MED (MISCELLANEOUS) ×2
STRIP CLOSURE SKIN 1/2X4 (GAUZE/BANDAGES/DRESSINGS) ×2 IMPLANT
SUT VIC AB 0 CT1 27 (SUTURE) ×1
SUT VIC AB 0 CT1 27XBRD ANBCTR (SUTURE) ×1 IMPLANT
SUT VIC AB 2-0 CT1 27 (SUTURE) ×1
SUT VIC AB 2-0 CT1 TAPERPNT 27 (SUTURE) ×1 IMPLANT
SUT VICRYL 4-0 PS2 18IN ABS (SUTURE) ×2 IMPLANT
SUT VLOC 180 0 24IN GS25 (SUTURE) ×2 IMPLANT
TOWEL OR 17X24 6PK STRL BLUE (TOWEL DISPOSABLE) ×2 IMPLANT
TOWEL OR 17X26 10 PK STRL BLUE (TOWEL DISPOSABLE) ×4 IMPLANT
TRAY CATH 16FR W/PLASTIC CATH (SET/KITS/TRAYS/PACK) IMPLANT
TRAY FOLEY W/METER SILVER 16FR (SET/KITS/TRAYS/PACK) IMPLANT
WATER STERILE IRR 1000ML POUR (IV SOLUTION) ×4 IMPLANT

## 2017-05-06 NOTE — Anesthesia Procedure Notes (Addendum)
Procedure Name: Intubation Date/Time: 05/06/2017 7:43 AM Performed by: Marena ChancyBeckner, Lucien Budney S, CRNA Pre-anesthesia Checklist: Patient identified, Emergency Drugs available, Suction available and Patient being monitored Patient Re-evaluated:Patient Re-evaluated prior to induction Oxygen Delivery Method: Circle System Utilized Preoxygenation: Pre-oxygenation with 100% oxygen Induction Type: IV induction Ventilation: Two handed mask ventilation required, Oral airway inserted - appropriate to patient size and Mask ventilation throughout procedure Laryngoscope Size: Miller and 3 Grade View: Grade II Tube type: Oral Tube size: 8.0 mm Number of attempts: 1 Airway Equipment and Method: Stylet and Oral airway Placement Confirmation: ETT inserted through vocal cords under direct vision,  positive ETCO2 and breath sounds checked- equal and bilateral Secured at: 23 cm Tube secured with: Tape Dental Injury: Teeth and Oropharynx as per pre-operative assessment

## 2017-05-06 NOTE — Brief Op Note (Signed)
05/06/2017  10:57 AM  PATIENT:  Jerl Minaaniel Wayne Manetta  57 y.o. male  PRE-OPERATIVE DIAGNOSIS:  RIGHT HIP OSTEOARTHRITIS  POST-OPERATIVE DIAGNOSIS:  RIGHT HIP OSTEOARTHRITIS  PROCEDURE:  Procedure(s): RIGHT TOTAL HIP ARTHROPLASTY DIRECT ANTERIOR (Right)  SURGEON:  Surgeon(s) and Role:    * Eldred MangesYates, Mark C, MD - Primary  PHYSICIAN ASSISTANT: Zonia Kiefjames Justise Ehmann pa-c     ANESTHESIA:   general  EBL:  800 mL   BLOOD ADMINISTERED:none  DRAINS: none   LOCAL MEDICATIONS USED:  MARCAINE/exparel    SPECIMEN:  No Specimen  DISPOSITION OF SPECIMEN:  N/A  COUNTS:  YES  TOURNIQUET:  * No tourniquets in log *  DICTATION: .Dragon Dictation  PLAN OF CARE: Admit to inpatient   PATIENT DISPOSITION:  PACU - hemodynamically stable.

## 2017-05-06 NOTE — Transfer of Care (Signed)
Immediate Anesthesia Transfer of Care Note  Patient: [name] [name]  Procedure(s) Performed: RIGHT TOTAL HIP ARTHROPLASTY DIRECT ANTERIOR (Right Hip)  Patient Location: PACU  Anesthesia Type:General  Level of Consciousness: awake, alert  and oriented  Airway & Oxygen Therapy: Patient Spontanous Breathing and Patient connected to face mask oxygen  Post-op Assessment: Report given to RN, Post -op Vital signs reviewed and stable and Patient moving all extremities X 4  Post vital signs: Reviewed and stable  Last Vitals:  Vitals Value Taken Time  BP 172/88 05/06/2017 10:47 AM  Temp    Pulse 85 05/06/2017 10:48 AM  Resp 15 05/06/2017 10:48 AM  SpO2 98 % 05/06/2017 10:48 AM  Vitals shown include unvalidated device data.  Last Pain:  Vitals:   05/06/17 0620  TempSrc: Oral  PainSc: 0-No pain      Patients Stated Pain Goal: 3 (05/06/17 [id number])  Complications: No apparent anesthesia complications

## 2017-05-06 NOTE — Progress Notes (Signed)
RT NOTE:  CPAP setup @ bedside with patient tubing/mask. Auto titrate 20/5, sterile water added. Pt will call RN when ready for bed to turn on O2.

## 2017-05-06 NOTE — Evaluation (Signed)
Physical Therapy Evaluation Patient Details Name: [name] [name] MRN: [id number] DOB: 07-20-1960 Today's Date: 05/06/2017   History of Present Illness  Pt is a 57 y/o male s/p R THA, direct anterior approach. PMH includes DM, HTN, OSA on CPAP, and L THA.   Clinical Impression  Pt is s/p surgery above with deficits below. Gait distance limited to chair secondary to pain. Required min to min guard A for mobility with RW. Educated about supine HEP. Will continue to follow acutely to maximize functional mobility independence and safety.     Follow Up Recommendations Follow surgeon's recommendation for DC plan and follow-up therapies;Supervision for mobility/OOB    Equipment Recommendations  None recommended by PT    Recommendations for Other Services       Precautions / Restrictions Precautions Precautions: None Precaution Comments: Reviewed supine HEP with pt Restrictions Weight Bearing Restrictions: Yes RLE Weight Bearing: Weight bearing as tolerated      Mobility  Bed Mobility Overal bed mobility: Needs Assistance Bed Mobility: Supine to Sit     Supine to sit: Min assist     General bed mobility comments: Min A for RLE management.   Transfers Overall transfer level: Needs assistance Equipment used: Rolling walker (2 wheeled) Transfers: Sit to/from Stand Sit to Stand: Min assist;From elevated surface         General transfer comment: Min A for lift assist and steadying from elevated surface.   Ambulation/Gait Ambulation/Gait assistance: Min guard Ambulation Distance (Feet): 5 Feet Assistive device: Rolling walker (2 wheeled) Gait Pattern/deviations: Step-to pattern;Decreased step length - right;Decreased step length - left;Decreased weight shift to right;Antalgic Gait velocity: Decreased  Gait velocity interpretation: <1.31 ft/sec, indicative of household ambulator General Gait Details: Slow, antalgic gait. Min guard for safety. Verbal cues for sequencing  using RW. Distance limited to chair secondary to pain.   Stairs            Wheelchair Mobility    Modified Rankin (Stroke Patients Only)       Balance Overall balance assessment: Needs assistance Sitting-balance support: No upper extremity supported;Feet supported Sitting balance-Leahy Scale: Good     Standing balance support: Bilateral upper extremity supported;During functional activity Standing balance-Leahy Scale: Poor Standing balance comment: Reliant on BUE support.                              Pertinent Vitals/Pain Pain Assessment: 0-10 Pain Score: 5  Pain Location: R hip  Pain Descriptors / Indicators: Aching;Operative site guarding Pain Intervention(s): Limited activity within patient's tolerance;Monitored during session;Repositioned    Home Living Family/patient expects to be discharged to:: Private residence Living Arrangements: Spouse/significant other Available Help at Discharge: Family;Available 24 hours/day Type of Home: House Home Access: Ramped entrance     Home Layout: One level Home Equipment: Bedside commode;Walker - 2 wheels      Prior Function Level of Independence: Independent               Hand Dominance   Dominant Hand: Left    Extremity/Trunk Assessment   Upper Extremity Assessment Upper Extremity Assessment: Defer to OT evaluation    Lower Extremity Assessment Lower Extremity Assessment: RLE deficits/detail RLE Deficits / Details: Sensory in tact. Deficits consistent with post op pain and weakness. Able to perform ther ex below.     Cervical / Trunk Assessment Cervical / Trunk Assessment: Normal  Communication   Communication: No difficulties  Cognition Arousal/Alertness: Awake/alert Behavior During  Therapy: WFL for tasks assessed/performed Overall Cognitive Status: Within Functional Limits for tasks assessed                                        General Comments      Exercises  Total Joint Exercises Ankle Circles/Pumps: AROM;Both;20 reps Quad Sets: AROM;Right;10 reps Heel Slides: AROM;Right;10 reps   Assessment/Plan    PT Assessment Patient needs continued PT services  PT Problem List Decreased strength;Decreased balance;Decreased mobility;Decreased knowledge of use of DME;Pain;Decreased activity tolerance       PT Treatment Interventions DME instruction;Gait training;Stair training;Functional mobility training;Therapeutic activities;Therapeutic exercise;Balance training;Neuromuscular re-education;Patient/family education    PT Goals (Current goals can be found in the Care Plan section)  Acute Rehab PT Goals Patient Stated Goal: to go home  PT Goal Formulation: With patient Time For Goal Achievement: 05/20/17 Potential to Achieve Goals: Good    Frequency 7X/week   Barriers to discharge        Co-evaluation               AM-PAC PT "6 Clicks" Daily Activity  Outcome Measure Difficulty turning over in bed (including adjusting bedclothes, sheets and blankets)?: A Little Difficulty moving from lying on back to sitting on the side of the bed? : Unable Difficulty sitting down on and standing up from a chair with arms (e.g., wheelchair, bedside commode, etc,.)?: Unable Help needed moving to and from a bed to chair (including a wheelchair)?: A Little Help needed walking in hospital room?: A Little Help needed climbing 3-5 steps with a railing? : A Lot 6 Click Score: 13    End of Session Equipment Utilized During Treatment: Gait belt Activity Tolerance: Patient limited by pain Patient left: in chair;with call bell/phone within reach Nurse Communication: Mobility status PT Visit Diagnosis: Other abnormalities of gait and mobility (R26.89);Pain Pain - Right/Left: Right Pain - part of body: Hip    Time: [id number] PT Time Calculation (min) (ACUTE ONLY): 24 min   Charges:   PT Evaluation $PT Eval Low Complexity: 1 Low PT  Treatments $Therapeutic Activity: 8-22 mins   PT G Codes:        Gladys DammeBrittany Anab Vivar, PT, DPT  Acute Rehabilitation Services  Pager: [phone]   Lehman PromBrittany S Saydi Kobel 05/06/2017, 5:55 PM

## 2017-05-06 NOTE — Op Note (Signed)
Preop diagnosis: Right hip primary osteoarthritis  Postop diagnosis: Same  Procedure: Right total hip arthroplasty direct anterior approach  Surgeon: Annell GreeningMark Tanuj Mullens MD  Assistant: Zonia KiefJames Owens PA-C medically necessary and present for exposure and critical portion of the procedure including implantation  and closure, medically necessary for the procedure.  Anesthesia: General plus Marcaine and Exparel.  EBL: Approximately 300 cc.  Implants:Depuy Gripton 56 mm cup series 100.  +4 mm neutral liner poly-.  -2 mm metal ball.Corail KA size 11 stem.  Apex hole eliminator.  36 mm ball.  Procedure: After standard prepping and draping with patient on the Hana table general anesthesia vancomycin given to patient due to his penicillin allergy Hana table was used.  Timeout procedure C arm visualization make sure both hips were visualized.  1015 drapes x2 prepping with DuraPrep usual anterior total hip sheets drapes large shower curtain Betadine Steri-Drape applied half sheet across the top and half sheet across the opposite side.  Incision was made starting 2 cm lateral 2 cm inferior to the ASIS obliquely down to the anterior aspect of the greater trochanter.  Fascia was nicked extended.  Dull Cobra probe placed over the anterior capsule fat was removed transverse arteries were coagulated.  Anterior capsule was opened C arm was brought in and neck was cut under C arm visualization appropriate 1 fingerbreadth above the lesser trochanter for an 11 mm neck cut.  Sequential reaming up to 55 mm on the acetabular side with 90 degree Homan placed directly on top of bone anteriorly.  Labrum was excised subsequent sequential reaming at 55 placement of 56 no hole cup under C arm visualization with cup abduction 45 degrees and appropriate cup flexion.  Reaming down to the base of the fovea was performed.  +4 neutral liner was inserted and dialed appropriately impacted and was secured check with the Therapist, nutritionalreer elevator.  Hydraulic  hook was applied leg was taken down across after posterior capsule was incised to allow lateralization for exposure of the canal.  Initially a box cutter cookie cutter curettes rondure large trochanteric clamp placed and after #8 broach was used leg was taken up checked AP and lateral fluoroscopy pictures and it appeared that further lateralization was needed for additional larger broaching.  Repeating cookie-cutter, rondure to the trochanter creating rondure and then repeat broaching with an 09/02/2008 and then 11.  Neck cut appeared 2 mm long additional 2 mm were taken off the neck with an oscillating saw.  #11 stem was inserted and advanced within 1 mm of the collar on the calcar would not advance any further.  Trial sizing senna -2 mm ball restoring neck length identical to the opposite hip.  Permanent ball was inserted after drying the trunnion.  Hip was reduced AP lateral picture was taken with neutral position and then the hip externally rotated 90 degrees confirming good position and intact trochanter and the stem directly down the middle of the canal.  There was no varus or valgus of the stem.  Neck length was identical to the opposite hip with a line drawn on the fluoroscopic picture across to both lesser trochanters.  Repeat coagulation of the transverse vessel that it began oozing some laterally was performed operative field was dry copious irrigation V lock closure deep fascia until Vicryl subcutaneous reapproximation of the subcu tissue skin staple closure postop dressing and transferred to the recovery room in stable condition with leg lengths equal.

## 2017-05-06 NOTE — Interval H&P Note (Signed)
History and Physical Interval Note:  05/06/2017 7:26 AM  [name] [name]  has presented today for surgery, with the diagnosis of RIGHT HIP OSTEOARTHRITIS  The various methods of treatment have been discussed with the patient and family. After consideration of risks, benefits and other options for treatment, the patient has consented to  Procedure(s): RIGHT TOTAL HIP ARTHROPLASTY DIRECT ANTERIOR (Right) as a surgical intervention .  The patient's history has been reviewed, patient examined, no change in status, stable for surgery.  I have reviewed the patient's chart and labs.  Questions were answered to the patient's satisfaction.     Eldred MangesMark C Dino Borntreger

## 2017-05-07 LAB — GLUCOSE, CAPILLARY
Glucose-Capillary: 160 mg/dL — ABNORMAL HIGH (ref 65–99)
Glucose-Capillary: 246 mg/dL — ABNORMAL HIGH (ref 65–99)
Glucose-Capillary: 165 mg/dL — ABNORMAL HIGH (ref 65–99)
Glucose-Capillary: 106 mg/dL — ABNORMAL HIGH (ref 65–99)

## 2017-05-07 LAB — BASIC METABOLIC PANEL
Anion gap: 9 (ref 5–15)
BUN: 21 mg/dL — AB (ref 6–20)
CALCIUM: 8 mg/dL — AB (ref 8.9–10.3)
CO2: 27 mmol/L (ref 22–32)
CREATININE: 1.07 mg/dL (ref 0.61–1.24)
Chloride: 103 mmol/L (ref 101–111)
GFR calc Af Amer: >60 mL/min (ref >60)
Glucose, Bld: 206 mg/dL — ABNORMAL HIGH (ref 65–99)
POTASSIUM: 3.5 mmol/L (ref 3.5–5.1)
SODIUM: 139 mmol/L (ref 135–145)

## 2017-05-07 LAB — CBC
HCT: 29 % — ABNORMAL LOW (ref 39.0–52.0)
Hemoglobin: 9.5 g/dL — ABNORMAL LOW (ref 13.0–17.0)
MCH: 29.4 pg (ref 26.0–34.0)
MCHC: 32.8 g/dL (ref 30.0–36.0)
MCV: 89.8 fL (ref 78.0–100.0)
PLATELETS: 215 10*3/uL (ref 150–400)
RBC: 3.23 MIL/uL — ABNORMAL LOW (ref 4.22–5.81)
RDW: 13.1 % (ref 11.5–15.5)
WBC: 9.5 10*3/uL (ref 4.0–10.5)

## 2017-05-07 NOTE — Progress Notes (Signed)
Patient ID: [name] [name], male   DOB: [birth year], 57 y.o.   MRN: [id number] Patient is status post right total hip arthroplasty.  He has no complaints this morning.  Postoperative day 1.  Possible discharge to home on Sunday.

## 2017-05-07 NOTE — Progress Notes (Signed)
Physical Therapy Treatment Patient Details Name: [name] [name] MRN: [id number] DOB: [birth year] Today's Date: 05/07/2017    History of Present Illness Pt is a 57 y/o male s/p R THA, direct anterior approach. PMH includes DM, HTN, OSA on CPAP, and L THA.     PT Comments    Decreased ambulation distance this session, as pt reports pain significantly increased after previous session. Completed HEP training this afternoon. Plan to see in AM to continue progressing ambulation prior to d/c.   Follow Up Recommendations  Follow surgeon's recommendation for DC plan and follow-up therapies;Supervision for mobility/OOB     Equipment Recommendations  None recommended by PT    Recommendations for Other Services       Precautions / Restrictions Precautions Precautions: None Precaution Comments: reviewed standing HEP Restrictions Weight Bearing Restrictions: Yes RLE Weight Bearing: Weight bearing as tolerated    Mobility  Bed Mobility Overal bed mobility: Needs Assistance Bed Mobility: Supine to Sit;Sit to Supine     Supine to sit: Min assist Sit to supine: Min assist   General bed mobility comments: min A for LE management on and off EOB. Pt able to independently scoot up in bed to position.  Transfers Overall transfer level: Needs assistance Equipment used: Rolling walker (2 wheeled) Transfers: Sit to/from Stand Sit to Stand: From elevated surface;Min guard         General transfer comment: min guard for safety. Increased time and effort with bed elevated.  Ambulation/Gait Ambulation/Gait assistance: Min guard Ambulation Distance (Feet): 150 Feet Assistive device: Rolling walker (2 wheeled) Gait Pattern/deviations: Decreased step length - right;Decreased step length - left;Decreased weight shift to right;Antalgic;Step-through pattern;Decreased stride length Gait velocity: Decreased    General Gait Details: Slow, antalgic gait. Min guard for safety. VC for postural  control and equal step length. Gait quality improved with distance.   Stairs             Wheelchair Mobility    Modified Rankin (Stroke Patients Only)       Balance Overall balance assessment: Needs assistance Sitting-balance support: No upper extremity supported;Feet supported Sitting balance-Leahy Scale: Good     Standing balance support: Bilateral upper extremity supported;During functional activity Standing balance-Leahy Scale: Poor Standing balance comment: Reliant on BUE support.                             Cognition Arousal/Alertness: Awake/alert Behavior During Therapy: WFL for tasks assessed/performed Overall Cognitive Status: Within Functional Limits for tasks assessed                                        Exercises Total Joint Exercises Hip ABduction/ADduction: AROM;Right;10 reps;Standing Knee Flexion: AROM;Right;10 reps;Standing Marching in Standing: AROM;Right;10 reps;Standing Standing Hip Extension: AROM;Right;10 reps;Standing    General Comments        Pertinent Vitals/Pain Pain Assessment: 0-10 Pain Score: 3  Pain Location: R hip  Pain Descriptors / Indicators: Aching;Operative site guarding Pain Intervention(s): Monitored during session;Limited activity within patient's tolerance    Home Living                      Prior Function            PT Goals (current goals can now be found in the care plan section) Acute Rehab PT Goals Patient Stated Goal: to  go home  PT Goal Formulation: With patient Time For Goal Achievement: 05/20/17 Potential to Achieve Goals: Good Progress towards PT goals: Progressing toward goals    Frequency    7X/week      PT Plan Current plan remains appropriate    Co-evaluation              AM-PAC PT "6 Clicks" Daily Activity  Outcome Measure  Difficulty turning over in bed (including adjusting bedclothes, sheets and blankets)?: A Little Difficulty moving  from lying on back to sitting on the side of the bed? : Unable Difficulty sitting down on and standing up from a chair with arms (e.g., wheelchair, bedside commode, etc,.)?: Unable Help needed moving to and from a bed to chair (including a wheelchair)?: A Little Help needed walking in hospital room?: A Little Help needed climbing 3-5 steps with a railing? : A Lot 6 Click Score: 13    End of Session Equipment Utilized During Treatment: Gait belt Activity Tolerance: Patient tolerated treatment well Patient left: with call bell/phone within reach;with family/visitor present;in bed Nurse Communication: Mobility status PT Visit Diagnosis: Other abnormalities of gait and mobility (R26.89);Pain Pain - Right/Left: Right Pain - part of body: Hip     Time: [id number] PT Time Calculation (min) (ACUTE ONLY): 22 min  Charges:  $Gait Training: 8-22 mins                    G Codes:       Kallie LocksHannah Elford Evilsizer, [place] Pager [phone] Acute Rehab   Sheral ApleyHannah E Gracelynn Bircher 05/07/2017, 2:22 PM

## 2017-05-07 NOTE — Plan of Care (Signed)
  Problem: Education: Goal: Knowledge of General Education information will improve Outcome: Progressing   Problem: Clinical Measurements: Goal: Ability to maintain clinical measurements within normal limits will improve Outcome: Progressing   Problem: Pain Managment: Goal: General experience of comfort will improve Outcome: Progressing   

## 2017-05-07 NOTE — Progress Notes (Signed)
Physical Therapy Treatment Patient Details Name: [name] MinaDaniel Wayne [name] MRN: [id number] DOB: March 01, 1960 Today's Date: 05/07/2017    History of Present Illness Pt is a 57 y/o male s/p R THA, direct anterior approach. PMH includes DM, HTN, OSA on CPAP, and L THA.     PT Comments    Pt is mobilizing well and progressing towards goals. He ambulated 225 ft with RW and min guard for safety. Continue progressing HEP and gait in future sessions. Will continue to follow acutely.    Follow Up Recommendations  Follow surgeon's recommendation for DC plan and follow-up therapies;Supervision for mobility/OOB     Equipment Recommendations  None recommended by PT    Recommendations for Other Services       Precautions / Restrictions Precautions Precautions: None Precaution Comments: Reviewed supine HEP and seated HEP with pt Restrictions Weight Bearing Restrictions: Yes RLE Weight Bearing: Weight bearing as tolerated    Mobility  Bed Mobility Overal bed mobility: Needs Assistance Bed Mobility: Supine to Sit     Supine to sit: Min assist     General bed mobility comments: Pt able to use leg lifter to progress LE off EOB, min A to fully negotiate LE off EOB as pt required BIL UEs and use of bed rail to elevate trunk. Increased time and effort.  Transfers Overall transfer level: Needs assistance Equipment used: Rolling walker (2 wheeled) Transfers: Sit to/from Stand Sit to Stand: From elevated surface;Min guard         General transfer comment: min guard for safety. Increased time and effort with bed elevated.  Ambulation/Gait Ambulation/Gait assistance: Min guard Ambulation Distance (Feet): 225 Feet Assistive device: Rolling walker (2 wheeled) Gait Pattern/deviations: Decreased step length - right;Decreased step length - left;Decreased weight shift to right;Antalgic;Step-through pattern;Decreased stride length Gait velocity: Decreased    General Gait Details: Slow, antalgic  gait. Min guard for safety. VC for postural control and equal step length. Gait quality improved with distance.   Stairs             Wheelchair Mobility    Modified Rankin (Stroke Patients Only)       Balance Overall balance assessment: Needs assistance Sitting-balance support: No upper extremity supported;Feet supported Sitting balance-Leahy Scale: Good     Standing balance support: Bilateral upper extremity supported;During functional activity Standing balance-Leahy Scale: Poor Standing balance comment: Reliant on BUE support.                             Cognition Arousal/Alertness: Awake/alert Behavior During Therapy: WFL for tasks assessed/performed Overall Cognitive Status: Within Functional Limits for tasks assessed                                        Exercises Total Joint Exercises Short Arc Quad: AROM;Right;10 reps;Supine Hip ABduction/ADduction: AAROM;Right;10 reps;Supine(assist to lift LE and reduce friction on bed) Long Arc Quad: AROM;Right;10 reps;Seated    General Comments        Pertinent Vitals/Pain Pain Assessment: 0-10 Pain Score: 4  Pain Location: R hip  Pain Descriptors / Indicators: Aching;Operative site guarding Pain Intervention(s): Monitored during session;Limited activity within patient's tolerance;Repositioned    Home Living                      Prior Function  PT Goals (current goals can now be found in the care plan section) Acute Rehab PT Goals Patient Stated Goal: to go home  PT Goal Formulation: With patient Time For Goal Achievement: 05/20/17 Potential to Achieve Goals: Good Progress towards PT goals: Progressing toward goals    Frequency    7X/week      PT Plan Current plan remains appropriate    Co-evaluation              AM-PAC PT "6 Clicks" Daily Activity  Outcome Measure  Difficulty turning over in bed (including adjusting bedclothes, sheets and  blankets)?: A Little Difficulty moving from lying on back to sitting on the side of the bed? : Unable Difficulty sitting down on and standing up from a chair with arms (e.g., wheelchair, bedside commode, etc,.)?: Unable Help needed moving to and from a bed to chair (including a wheelchair)?: A Little Help needed walking in hospital room?: A Little Help needed climbing 3-5 steps with a railing? : A Lot 6 Click Score: 13    End of Session Equipment Utilized During Treatment: Gait belt Activity Tolerance: Patient tolerated treatment well Patient left: in chair;with call bell/phone within reach;with family/visitor present Nurse Communication: Mobility status PT Visit Diagnosis: Other abnormalities of gait and mobility (R26.89);Pain Pain - Right/Left: Right Pain - part of body: Hip     Time: 7829-5621 PT Time Calculation (min) (ACUTE ONLY): 27 min  Charges:  $Gait Training: 8-22 mins $Therapeutic Exercise: 8-22 mins                    G Codes:       Kallie Locks, [place] Pager [phone] Acute Rehab   Sheral Apley 05/07/2017, 9:43 AM

## 2017-05-08 LAB — CBC
HCT: 27.3 % — ABNORMAL LOW (ref 39.0–52.0)
HEMOGLOBIN: 8.9 g/dL — AB (ref 13.0–17.0)
MCH: 29.1 pg (ref 26.0–34.0)
MCHC: 32.6 g/dL (ref 30.0–36.0)
MCV: 89.2 fL (ref 78.0–100.0)
PLATELETS: 194 10*3/uL (ref 150–400)
RBC: 3.06 MIL/uL — AB (ref 4.22–5.81)
RDW: 12.9 % (ref 11.5–15.5)
WBC: 11.5 10*3/uL — ABNORMAL HIGH (ref 4.0–10.5)

## 2017-05-08 LAB — GLUCOSE, CAPILLARY: Glucose-Capillary: 79 mg/dL (ref 65–99)

## 2017-05-08 MED ORDER — OXYCODONE-ACETAMINOPHEN 5-325 MG PO TABS: 1.0000 | ORAL_TABLET | ORAL | 0 refills | Status: DC | PRN

## 2017-05-08 NOTE — Anesthesia Postprocedure Evaluation (Signed)
Anesthesia Post Note  Patient: [name] [name]  Procedure(s) Performed: RIGHT TOTAL HIP ARTHROPLASTY DIRECT ANTERIOR (Right Hip)     Patient location during evaluation: PACU Anesthesia Type: General Level of consciousness: sedated and patient cooperative Pain management: pain level controlled Vital Signs Assessment: post-procedure vital signs reviewed and stable Respiratory status: spontaneous breathing Cardiovascular status: stable Anesthetic complications: no    Last Vitals:  Vitals:   05/07/17 1943 05/08/17 0534  BP: 117/61 (!) 142/71  Pulse: 98 98  Resp: 18   Temp: (!) 38.3 C 37.8 C  SpO2: 96% 98%    Last Pain:  Vitals:   05/08/17 1100  TempSrc:   PainSc: 4                  Lewie LoronJohn Beryle Zeitz

## 2017-05-08 NOTE — Care Management Note (Signed)
Case Management Note  Patient Details  Name: [name] MinaDaniel Wayne [name] MRN: [id number] Date of Birth: [birth year]  Subjective/Objective:                 Spoke w patient at the bedside. He states that this is his 2nd hip sx. Last sx he did rx'd exercises from Dr Ophelia CharterYates, no HH. He discussed this w Dr Ophelia CharterYates and this will be the plan for this sx as well. No HH needed and patient all needed DME at home already. No other CM needs.    Action/Plan:   Expected Discharge Date:  05/08/17               Expected Discharge Plan:  Home/Self Care  In-House Referral:     Discharge planning Services  CM Consult  Post Acute Care Choice:    Choice offered to:     DME Arranged:    DME Agency:     HH Arranged:    HH Agency:     Status of Service:  Completed, signed off  If discussed at [employer] Length of Stay Meetings, dates discussed:    Additional Comments:  Lawerance SabalDebbie Deklynn Charlet, RN 05/08/2017, 10:32 AM

## 2017-05-08 NOTE — Discharge Summary (Signed)
Discharge Diagnoses:  Active Problems:   Arthritis of right hip   Surgeries: Procedure(s): RIGHT TOTAL HIP ARTHROPLASTY DIRECT ANTERIOR on 05/06/2017    Consultants:   Discharged Condition: Improved  Hospital Course: [name] [name] is an 57 y.o. male who was admitted 05/06/2017 with a chief complaint of right hip OA, with a final diagnosis of RIGHT HIP OSTEOARTHRITIS.  Patient was brought to the operating room on 05/06/2017 and underwent Procedure(s): RIGHT TOTAL HIP ARTHROPLASTY DIRECT ANTERIOR.    Patient was given perioperative antibiotics:  Anti-infectives (From admission, onward)   Start     Dose/Rate Route Frequency Ordered Stop   05/06/17 1800  vancomycin (VANCOCIN) IVPB 1000 mg/200 mL premix     1,000 mg 200 mL/hr over 60 Minutes Intravenous Every 12 hours 05/06/17 1112 05/06/17 1940   05/06/17 0600  vancomycin (VANCOCIN) 1,500 mg in sodium chloride 0.9 % 500 mL IVPB     1,500 mg 250 mL/hr over 120 Minutes Intravenous To ShortStay Surgical 05/05/17 1217 05/06/17 0812    .  Patient was given sequential compression devices, early ambulation, and aspirin for DVT prophylaxis.  Recent vital signs:  Patient Vitals for the past 24 hrs:  BP Temp Temp src Pulse Resp SpO2  05/08/17 0534 (!) 142/71 100.1 F (37.8 C) Oral 98 - 98 %  05/07/17 1943 117/61 (!) 100.9 F (38.3 C) Oral 98 18 96 %  05/07/17 1634 (!) 105/55 99.7 F (37.6 C) Oral 82 18 99 %  05/07/17 1121 124/70 98.9 F (37.2 C) Oral (!) 102 16 95 %  .  Recent laboratory studies: Dg C-arm 1-60 Min  Result Date: 05/06/2017 CLINICAL DATA:  Right hip replacement EXAM: DG C-ARM 61-120 MIN; OPERATIVE RIGHT HIP WITH PELVIS COMPARISON:  None. FINDINGS: 2 intraoperative spot images demonstrate changes of right hip replacement. Normal AP alignment. No hardware or bony complicating feature. IMPRESSION: Right hip replacement.  No complicating feature. Electronically Signed   By: Charlett NoseKevin  Dover M.D.   On: 05/06/2017 11:12   Dg  C-arm 1-60 Min  Result Date: 05/06/2017 CLINICAL DATA:  Right hip replacement EXAM: DG C-ARM 61-120 MIN; OPERATIVE RIGHT HIP WITH PELVIS COMPARISON:  None. FINDINGS: 2 intraoperative spot images demonstrate changes of right hip replacement. Normal AP alignment. No hardware or bony complicating feature. IMPRESSION: Right hip replacement.  No complicating feature. Electronically Signed   By: Charlett NoseKevin  Dover M.D.   On: 05/06/2017 11:12   Dg Hip Port Unilat With Pelvis 1v Right  Result Date: 05/06/2017 CLINICAL DATA:  Right total hip arthroplasty. EXAM: DG HIP (WITH OR WITHOUT PELVIS) 1V PORT RIGHT COMPARISON:  Intraoperative radiographs 05/06/2017 FINDINGS: Patient now has bilateral hip arthroplasties. The right hip arthroplasty is located based on the cross-table lateral view. The entire femoral stem is visualized on the cross-table lateral view without gross abnormality. Skin staples are present. IMPRESSION: Right hip arthroplasty without complicating features. Electronically Signed   By: Richarda OverlieAdam  Henn M.D.   On: 05/06/2017 11:55   Dg Hip Operative Unilat W Or W/o Pelvis Right  Result Date: 05/06/2017 CLINICAL DATA:  Right hip replacement EXAM: DG C-ARM 61-120 MIN; OPERATIVE RIGHT HIP WITH PELVIS COMPARISON:  None. FINDINGS: 2 intraoperative spot images demonstrate changes of right hip replacement. Normal AP alignment. No hardware or bony complicating feature. IMPRESSION: Right hip replacement.  No complicating feature. Electronically Signed   By: Charlett NoseKevin  Dover M.D.   On: 05/06/2017 11:12    Discharge Medications:   Allergies as of 05/08/2017  Reactions   Shellfish Allergy Anaphylaxis   Penicillins Hives, Other (See Comments)   PATIENT HAS HAD A PCN REACTION WITH IMMEDIATE RASH, FACIAL/TONGUE/THROAT SWELLING, SOB, OR LIGHTHEADEDNESS WITH HYPOTENSION:  #  #  YES  #  #  Has patient had a PCN reaction causing severe rash involving mucus membranes or skin necrosis: No Has patient had a PCN reaction that  required hospitalization No Has patient had a PCN reaction occurring within the last 10 years: No      Medication List    TAKE these medications   aspirin EC 81 MG tablet Take 81 mg by mouth daily.   atorvastatin 10 MG tablet Commonly known as:  LIPITOR Take 10 mg by mouth daily.   b complex vitamins tablet Take 1 tablet by mouth daily.   B-D ULTRAFINE III SHORT PEN 31G X 8 MM Misc Generic drug:  Insulin Pen Needle Inject insulin once daily.   carvedilol 6.25 MG tablet Commonly known as:  COREG Take 6.25 mg by mouth 2 (two) times daily with a meal.   Cinnamon 500 MG Tabs Take 1,000 mg by mouth daily.   glimepiride 4 MG tablet Commonly known as:  AMARYL Take 4 mg by mouth 2 (two) times daily with a meal.   hydrochlorothiazide 25 MG tablet Commonly known as:  HYDRODIURIL Take 25 mg by mouth daily.   [employer] TEST STRIPS test strip Generic drug:  glucose blood Check blood sugar three times daily.  250.02  One touch ultra or other test strip of patient's choice.   CONTOUR NEXT TEST test strip Generic drug:  glucose blood   LANTUS SOLOSTAR 100 UNIT/ML Solostar Pen Generic drug:  Insulin Glargine Inject 45-70 Units into the skin 2 (two) times daily. Inject 45 units in the morning & 70 units in the evening   losartan 100 MG tablet Commonly known as:  COZAAR Take 100 mg by mouth daily.   oxyCODONE-acetaminophen 5-325 MG tablet Commonly known as:  PERCOCET/ROXICET Take 1 tablet by mouth every 4 (four) hours as needed for severe pain.   sitaGLIPtin-metformin 50-1000 MG tablet Commonly known as:  JANUMET Take 1 tablet by mouth 2 (two) times daily.   vitamin C 1000 MG tablet Take 1,000 mg by mouth daily.       Diagnostic Studies: Dg Chest 2 View  Result Date: 04/28/2017 CLINICAL DATA:  Preop.  Total hip arthroplasty. EXAM: CHEST - 2 VIEW COMPARISON:  05/31/2014 FINDINGS: Normal heart size. Bibasilar atelectasis. Upper lungs clear. A button projects over the right  upper lung zone. No pneumothorax or pleural effusion. IMPRESSION: Bibasilar atelectasis. Electronically Signed   By: Jolaine Click M.D.   On: 04/28/2017 08:37   Dg C-arm 1-60 Min  Result Date: 05/06/2017 CLINICAL DATA:  Right hip replacement EXAM: DG C-ARM 61-120 MIN; OPERATIVE RIGHT HIP WITH PELVIS COMPARISON:  None. FINDINGS: 2 intraoperative spot images demonstrate changes of right hip replacement. Normal AP alignment. No hardware or bony complicating feature. IMPRESSION: Right hip replacement.  No complicating feature. Electronically Signed   By: Charlett Nose M.D.   On: 05/06/2017 11:12   Dg C-arm 1-60 Min  Result Date: 05/06/2017 CLINICAL DATA:  Right hip replacement EXAM: DG C-ARM 61-120 MIN; OPERATIVE RIGHT HIP WITH PELVIS COMPARISON:  None. FINDINGS: 2 intraoperative spot images demonstrate changes of right hip replacement. Normal AP alignment. No hardware or bony complicating feature. IMPRESSION: Right hip replacement.  No complicating feature. Electronically Signed   By: Charlett Nose M.D.   On:  05/06/2017 11:12   Dg Hip Port Unilat With Pelvis 1v Right  Result Date: 05/06/2017 CLINICAL DATA:  Right total hip arthroplasty. EXAM: DG HIP (WITH OR WITHOUT PELVIS) 1V PORT RIGHT COMPARISON:  Intraoperative radiographs 05/06/2017 FINDINGS: Patient now has bilateral hip arthroplasties. The right hip arthroplasty is located based on the cross-table lateral view. The entire femoral stem is visualized on the cross-table lateral view without gross abnormality. Skin staples are present. IMPRESSION: Right hip arthroplasty without complicating features. Electronically Signed   By: Richarda Overlie M.D.   On: 05/06/2017 11:55   Dg Hip Operative Unilat W Or W/o Pelvis Right  Result Date: 05/06/2017 CLINICAL DATA:  Right hip replacement EXAM: DG C-ARM 61-120 MIN; OPERATIVE RIGHT HIP WITH PELVIS COMPARISON:  None. FINDINGS: 2 intraoperative spot images demonstrate changes of right hip replacement. Normal AP  alignment. No hardware or bony complicating feature. IMPRESSION: Right hip replacement.  No complicating feature. Electronically Signed   By: Charlett Nose M.D.   On: 05/06/2017 11:12    Patient benefited maximally from their hospital stay and there were no complications.     Disposition: Discharge disposition: 01-Home or Self Care      Discharge Instructions    Call MD / Call 911   Complete by:  As directed    If you experience chest pain or shortness of breath, CALL 911 and be transported to the hospital emergency room.  If you develope a fever above 101 F, pus (white drainage) or increased drainage or redness at the wound, or calf pain, call your surgeon's office.   Constipation Prevention   Complete by:  As directed    Drink plenty of fluids.  Prune juice may be helpful.  You may use a stool softener, such as Colace (over the counter) 100 mg twice a day.  Use MiraLax (over the counter) for constipation as needed.   Diet - low sodium heart healthy   Complete by:  As directed    Increase activity slowly as tolerated   Complete by:  As directed      Follow-up Information    Eldred Manges, MD Follow up in 1 week(s).   Specialty:  Orthopedic Surgery Contact information: [address] [place] [address] [phone]            Signed: Nadara Mustard 05/08/2017, 10:28 AM

## 2017-05-08 NOTE — Progress Notes (Signed)
Discharged education/instructions given to Pt, all questions and concerns addressed, Pt not in distress, discharged home with belongings accompanied by wife.

## 2017-05-08 NOTE — Progress Notes (Signed)
Physical Therapy Treatment Patient Details Name: [name] [name] MRN: [id number] DOB: 1960-11-30 Today's Date: 05/08/2017    History of Present Illness Pt is a 57 y/o male s/p R THA, direct anterior approach. PMH includes DM, HTN, OSA on CPAP, and L THA.     PT Comments    Treatment included bed mobility, bed exercises and ambulation.  Despite inefficiency with supine to sit, reliance on BUE in sit to stand, and antalgic gait with forward posture, d/c home remains appropriate. Pt states having extensive support at home (wife and children) and ability to tolerate home distances was confirmed during treatment. Pt also has history of surgeries and this is his second THA, so pt is aware of necessary lifestyle adjustments and tolerance to pain.Treatment included bed mobility, bed exercises and ambulation.  Despite inefficiency with supine to sit, reliance on BUE in sit to stand, and antalgic gait with forward posture, d/c home remains appropriate. Pt states having extensive support at home (wife and children) and ability to tolerate home distances was confirmed during treatment. Pt also has history of surgeries and this is his second THA, so pt is aware of necessary lifestyle adjustments and tolerance to pain.   Follow Up Recommendations  Follow surgeon's recommendation for DC plan and follow-up therapies;Supervision for mobility/OOB     Equipment Recommendations  None recommended by PT   Recommendations for Other Services       Precautions / Restrictions Precautions Precautions: None Restrictions Weight Bearing Restrictions: Yes RLE Weight Bearing: Weight bearing as tolerated    Mobility  Bed Mobility Overal bed mobility: Needs Assistance Bed Mobility: Supine to Sit     Supine to sit: Min assist     General bed mobility comments: min A for LE management on and off EOB. Pt able to independently scoot up in bed to position. Pt noticeably inefficent and struggled throughout  task  Transfers Overall transfer level: Needs assistance Equipment used: Rolling walker (2 wheeled) Transfers: Sit to/from Stand Sit to Stand: Min assist         General transfer comment: Pt able to put weight through UE to stand but needed VC to push off from bed; min A to stabilize walker and prevent from anteriorl displacement  Ambulation/Gait Ambulation/Gait assistance: Min guard Ambulation Distance (Feet): 150 Feet Assistive device: Rolling walker (2 wheeled) Gait Pattern/deviations: Step-to pattern;Decreased stride length;Decreased step length - right;Decreased step length - left;Antalgic Gait velocity: Decreased    General Gait Details: Slow, antalgic gait. Min guard for safety. VC for postural control and equal step length. Gait quality improved with distance.   Stairs             Wheelchair Mobility    Modified Rankin (Stroke Patients Only)       Balance Overall balance assessment: Needs assistance Sitting-balance support: Feet supported       Standing balance support: Bilateral upper extremity supported;During functional activity   Standing balance comment: Reliant on BUE support.                             Cognition Arousal/Alertness: Awake/alert Behavior During Therapy: WFL for tasks assessed/performed Overall Cognitive Status: Within Functional Limits for tasks assessed                                        Exercises Total Joint Exercises Ankle  Circles/Pumps: AROM;Both;20 reps;Supine Quad Sets: AROM;Right;10 reps;Supine Gluteal Sets: Both;AROM;10 reps;Supine Towel Squeeze: AROM;Both;Supine;10 reps Heel Slides: AAROM;Right;10 reps;Supine Hip ABduction/ADduction: AAROM;Right;10 reps;Supine    General Comments        Pertinent Vitals/Pain Pain Assessment: 0-10 Pain Score: 2 (Increases to 4 with certain movements; assessed ambulating) Pain Location: R hip  Pain Descriptors / Indicators:  Aching("tweaking") Pain Intervention(s): Limited activity within patient's tolerance;Monitored during session;Premedicated before session;Relaxation;Utilized relaxation techniques    Home Living                      Prior Function            PT Goals (current goals can now be found in the care plan section) Acute Rehab PT Goals Patient Stated Goal: to go home  PT Goal Formulation: With patient Time For Goal Achievement: 05/20/17 Potential to Achieve Goals: Good Progress towards PT goals: Progressing toward goals    Frequency    7X/week      PT Plan Current plan remains appropriate    Co-evaluation              AM-PAC PT "6 Clicks" Daily Activity  Outcome Measure  Difficulty turning over in bed (including adjusting bedclothes, sheets and blankets)?: A Little Difficulty moving from lying on back to sitting on the side of the bed? : A Lot Difficulty sitting down on and standing up from a chair with arms (e.g., wheelchair, bedside commode, etc,.)?: A Lot Help needed moving to and from a bed to chair (including a wheelchair)?: A Little Help needed walking in hospital room?: A Little Help needed climbing 3-5 steps with a railing? : A Lot 6 Click Score: 15    End of Session Equipment Utilized During Treatment: Gait belt Activity Tolerance: Patient tolerated treatment well Patient left: in chair;with call bell/phone within reach Nurse Communication: Mobility status PT Visit Diagnosis: Other abnormalities of gait and mobility (R26.89);Pain Pain - Right/Left: Right Pain - part of body: Hip     Time: 0827-0905 PT Time Calculation (min) (ACUTE ONLY): 38 min  Charges:  $Gait Training: 8-22 mins $Therapeutic Exercise: 8-22 mins $Therapeutic Activity: 8-22 mins                    G Codes:       [name] [name], PT  Acute Rehabilitation Services Pager [phone] Office [phone]    [name] AlandHolly H Zyren [name] 05/08/2017, 1:53 PM

## 2017-05-08 NOTE — Plan of Care (Signed)
?  Problem: Elimination: ?Goal: Will not experience complications related to bowel motility ?Outcome: Progressing ?  ?Problem: Pain Managment: ?Goal: General experience of comfort will improve ?Outcome: Progressing ?  ?Problem: Safety: ?Goal: Ability to remain free from injury will improve ?Outcome: Progressing ?  ?

## 2017-05-09 ENCOUNTER — Encounter (HOSPITAL_COMMUNITY): Payer: Self-pay | Admitting: Orthopaedic Surgery

## 2017-05-20 ENCOUNTER — Ambulatory Visit (INDEPENDENT_AMBULATORY_CARE_PROVIDER_SITE_OTHER): Payer: BLUE CROSS/BLUE SHIELD | Admitting: Physician Assistant

## 2017-05-20 ENCOUNTER — Other Ambulatory Visit (INDEPENDENT_AMBULATORY_CARE_PROVIDER_SITE_OTHER): Payer: Self-pay

## 2017-05-20 ENCOUNTER — Ambulatory Visit (INDEPENDENT_AMBULATORY_CARE_PROVIDER_SITE_OTHER): Payer: BLUE CROSS/BLUE SHIELD

## 2017-05-20 ENCOUNTER — Encounter (INDEPENDENT_AMBULATORY_CARE_PROVIDER_SITE_OTHER): Payer: Self-pay | Admitting: Physician Assistant

## 2017-05-20 ENCOUNTER — Inpatient Hospital Stay (INDEPENDENT_AMBULATORY_CARE_PROVIDER_SITE_OTHER): Payer: BLUE CROSS/BLUE SHIELD | Admitting: Orthopaedic Surgery

## 2017-05-20 VITALS — Ht 74.0 in | Wt 341.0 lb

## 2017-05-20 DIAGNOSIS — Z96641 Presence of right artificial hip joint: Secondary | ICD-10-CM

## 2017-05-20 NOTE — Progress Notes (Signed)
Post-Op Visit Note   Patient: [name] [name]           Date of Birth: 1960-03-22           MRN: [id number] Visit Date: 05/20/2017 PCP: Rolm GalaGrandis, Heidi, MD   Assessment & Plan:  Chief Complaint:  Chief Complaint  Patient presents with  . Right Hip - Routine Post Op    05/06/17 right total hip   Visit Diagnoses:  1. History of total right hip replacement     Plan: Patient is a pleasant 57 year old gentleman who presents to our clinic today 14 days status post right direct anterior total hip replacement by Dr. Ophelia CharterYates, date of surgery 05/06/2017.  He has been doing okay since surgery.  Minimal pain is no longer utilizing narcotics.  He has not had home health PT or outpatient PT, but continues to work on exercises on his own.  No fevers, chills or any other systemic symptoms.  Examination of his right hip reveals a well-healing surgical incision with staples in place.  Minimal peri-incisional erythema to the distal third of the incision.  No evidence of infection.  Calf are soft and nontender.  He is neurovascularly intact distally.  At this point, the patient does not feel that he needs formal physical therapy.  He will continue to work on range of motion on his own.  He will follow-up with Dr. Ophelia CharterYates in 4 weeks time for repeat evaluation.  Call if concerns or questions in the meantime.  Follow-Up Instructions: Return in about 1 month (around 06/17/2017) for with dr.yates.   Orders:  Orders Placed This Encounter  Procedures  . XR HIP UNILAT W OR W/O PELVIS 2-3 VIEWS RIGHT   No orders of the defined types were placed in this encounter.   Imaging: Xr Hip Unilat W Or W/o Pelvis 2-3 Views Right  Result Date: 05/20/2017 Stable right total hip replacement   PMFS History: Patient Active Problem List   Diagnosis Date Noted  . Arthritis of right hip 05/06/2017  . History of total right hip replacement 02/07/2017  . Arthrodesis status 01/10/2015  . Postoperative fever 05/31/2014  .  Acute respiratory failure with hypoxia (HCC) 05/31/2014  . Status post arthrodesis 05/29/2014  . Unilateral primary osteoarthritis, right hip 03/02/2013   Past Medical History:  Diagnosis Date  . Diabetes mellitus without complication (HCC)    type 2   . Heart murmur    Hx: of as achild  . Hypertension   . Inguinal hernia    Hx: of left groin  . Kidney stones    Hx: of  . OA (osteoarthritis) of hip    Hx: of  . Sleep apnea    Uses CPAP    Family History  Problem Relation Age of Onset  . Diabetes Mother   . Hypertension Mother   . Cancer - Other Mother   . Cancer - Lung Father   . Arthritis Sister   . Heart disease Sister     Past Surgical History:  Procedure Laterality Date  . COLONOSCOPY     Hx: of  . FOOT ARTHRODESIS Left 05/29/2014   Procedure: Left Triple Arthrodesis, Possible Achilles Lengthening;  Surgeon: Eldred MangesMark C Yates, MD;  Location: [facility] OR;  Service: Orthopedics;  Laterality: Left;  . FOOT ARTHRODESIS Right 01/10/2015   Procedure: Right Triple Arthrodesis, Achilles Lengthening;  Surgeon: Eldred MangesMark C Yates, MD;  [facility];  Service: Orthopedics;  Laterality: Right;  . FOOT ARTHRODESIS, TRIPLE Left 05/29/2014  .  HARDWARE REMOVAL Left 01/10/2015   Procedure: Left Foot Screw Removal ;  Surgeon: Eldred Manges, MD;  Location: [facility] OR;  Service: Orthopedics;  Laterality: Left;  . TOTAL HIP ARTHROPLASTY Left 03/02/2013   DR [name]  . TOTAL HIP ARTHROPLASTY Left 03/02/2013   Procedure: TOTAL HIP ARTHROPLASTY ANTERIOR APPROACH;  Surgeon: Eldred Manges, MD;  Location: [facility] OR;  Service: Orthopedics;  Laterality: Left;  Left Total Hip Arthroplasty Direct Anterior Approach  . TOTAL HIP ARTHROPLASTY Right 05/06/2017   Procedure: RIGHT TOTAL HIP ARTHROPLASTY DIRECT ANTERIOR;  Surgeon: Eldred Manges, MD;  Location: [facility] OR;  Service: Orthopedics;  Laterality: Right;   Social History   Occupational History  . Not on file  Tobacco Use  . Smoking status: Former Smoker    Types: Cigars,  Cigarettes  . Smokeless tobacco: Current User    Types: Snuff  . Tobacco comment: Quit smoking 1998  Substance and Sexual Activity  . Alcohol use: Yes    Alcohol/week: 1.2 oz    Types: 2 Shots of liquor per week    Comment: once a week   . Drug use: No  . Sexual activity: Not on file

## 2017-05-23 ENCOUNTER — Telehealth (INDEPENDENT_AMBULATORY_CARE_PROVIDER_SITE_OTHER): Payer: Self-pay | Admitting: Physician Assistant

## 2017-05-23 NOTE — Telephone Encounter (Signed)
Ok TO DO . u call, he may want [place] to fax note in and you can send him a copy as well. Sometimes employers want it sent in from [place] if not an original. thanks

## 2017-05-23 NOTE — Telephone Encounter (Signed)
This is ok to do

## 2017-05-23 NOTE — Telephone Encounter (Signed)
Patient called stating that he ended up having to stay home another week and needs his return to work note changed to May 30, 2017. He saw Mardella Layman on Friday due to Dr. [name] being in surgery. Patient requested that the note be emailed to him at the following email [email or web address] Thank you.

## 2017-05-23 NOTE — Telephone Encounter (Signed)
See message below °

## 2017-05-24 NOTE — Telephone Encounter (Signed)
Note sent to patient at email address given.

## 2017-05-29 ENCOUNTER — Emergency Department (HOSPITAL_COMMUNITY)
Admission: EM | Admit: 2017-05-29 | Discharge: 2017-05-29 | Disposition: A | Discharge status: Home / Self Care | Payer: BLUE CROSS/BLUE SHIELD | Attending: Emergency Medicine | Admitting: Emergency Medicine

## 2017-05-29 ENCOUNTER — Emergency Department (HOSPITAL_COMMUNITY): Payer: BLUE CROSS/BLUE SHIELD

## 2017-05-29 ENCOUNTER — Encounter (HOSPITAL_COMMUNITY): Payer: Self-pay | Admitting: Emergency Medicine

## 2017-05-29 DIAGNOSIS — Y999 Unspecified external cause status: Secondary | ICD-10-CM | POA: Diagnosis not present

## 2017-05-29 DIAGNOSIS — Y939 Activity, unspecified: Secondary | ICD-10-CM | POA: Insufficient documentation

## 2017-05-29 DIAGNOSIS — T148XXA Other injury of unspecified body region, initial encounter: Secondary | ICD-10-CM

## 2017-05-29 DIAGNOSIS — Z7982 Long term (current) use of aspirin: Secondary | ICD-10-CM | POA: Insufficient documentation

## 2017-05-29 DIAGNOSIS — Z794 Long term (current) use of insulin: Secondary | ICD-10-CM | POA: Insufficient documentation

## 2017-05-29 DIAGNOSIS — I10 Essential (primary) hypertension: Secondary | ICD-10-CM | POA: Diagnosis not present

## 2017-05-29 DIAGNOSIS — Z79899 Other long term (current) drug therapy: Secondary | ICD-10-CM | POA: Insufficient documentation

## 2017-05-29 DIAGNOSIS — T84020A Dislocation of internal right hip prosthesis, initial encounter: Secondary | ICD-10-CM | POA: Diagnosis not present

## 2017-05-29 DIAGNOSIS — Z87891 Personal history of nicotine dependence: Secondary | ICD-10-CM | POA: Diagnosis not present

## 2017-05-29 DIAGNOSIS — Y929 Unspecified place or not applicable: Secondary | ICD-10-CM | POA: Insufficient documentation

## 2017-05-29 DIAGNOSIS — X58XXXA Exposure to other specified factors, initial encounter: Secondary | ICD-10-CM | POA: Diagnosis not present

## 2017-05-29 DIAGNOSIS — S73001A Unspecified subluxation of right hip, initial encounter: Secondary | ICD-10-CM | POA: Diagnosis not present

## 2017-05-29 DIAGNOSIS — E119 Type 2 diabetes mellitus without complications: Secondary | ICD-10-CM | POA: Diagnosis not present

## 2017-05-29 DIAGNOSIS — M25551 Pain in right hip: Secondary | ICD-10-CM | POA: Diagnosis not present

## 2017-05-29 DIAGNOSIS — S73004A Unspecified dislocation of right hip, initial encounter: Secondary | ICD-10-CM | POA: Diagnosis not present

## 2017-05-29 MED ORDER — SODIUM CHLORIDE 0.9 % IV BOLUS
1000.0000 mL | Freq: Once | INTRAVENOUS | Status: AC
  Administered 2017-05-29: 1000 mL via INTRAVENOUS

## 2017-05-29 MED ORDER — HYDROMORPHONE HCL 2 MG/ML IJ SOLN
1.0000 mg | Freq: Once | INTRAMUSCULAR | Status: AC
  Administered 2017-05-29: 1 mg via INTRAVENOUS
  Filled 2017-05-29: qty 1

## 2017-05-29 MED ORDER — PROPOFOL 1000 MG/100ML IV EMUL
INTRAVENOUS | Status: AC | PRN
  Administered 2017-05-29: 100 ug/kg/min via INTRAVENOUS

## 2017-05-29 MED ORDER — ONDANSETRON HCL 4 MG/2ML IJ SOLN
4.0000 mg | Freq: Once | INTRAMUSCULAR | Status: AC
  Administered 2017-05-29: 4 mg via INTRAVENOUS
  Filled 2017-05-29: qty 2

## 2017-05-29 MED ORDER — PROPOFOL 10 MG/ML IV BOLUS
1.0000 mg/kg | Freq: Once | INTRAVENOUS | Status: DC
  Filled 2017-05-29: qty 20

## 2017-05-29 NOTE — ED Notes (Signed)
Patient transported to X-ray 

## 2017-05-29 NOTE — Discharge Instructions (Addendum)
Your evaluated in the emergency department for a dislocation of your right hip replacement.  You will need to try and keep your foot from rotating out or pivoting on that leg.  Please call your orthopedist tomorrow for evaluation and further recommendations.  You should continue your pain medication as needed.  Please return if any worsening symptoms.

## 2017-05-29 NOTE — ED Triage Notes (Signed)
Pt arrives via EMS from home with complaints of right hip dislocation that occurred at rest. Pt reports having hip surgery 3 weeks ago (4/12) by Dr. [name]. Pt reports taking 5 mg oxycodone before calling EMS.

## 2017-05-29 NOTE — ED Provider Notes (Signed)
[facility] EMERGENCY DEPARTMENT Provider Note   CSN: [id number] Arrival date & time: 05/29/17  0808     History   Chief Complaint Chief Complaint  Patient presents with  . Hip Injury    HPI [name] [name] is a 57 y.o. male.  He is complaining of acute onset of right hip pain that occurred just prior to arrival.  He had a total hip replacement about 3 weeks ago by Dr. [name] for osteoarthritis.  States is been going very well he has been ambulatory.  Last night he slept in his recliner and today when he woke up he rolled to straighten and had acute onset of pain severe like and asked abdomen the groin.  He states since then he has been unable to arrange the hip and it feels like there is a lot of pressure in his glute on that side.  The history is provided by the patient.  Hip Pain  This is a new problem. The current episode started 1 to 2 hours ago. The problem occurs constantly. The problem has not changed since onset.Pertinent negatives include no chest pain, no abdominal pain, no headaches and no shortness of breath. The symptoms are aggravated by twisting and bending. Nothing relieves the symptoms. He has tried nothing for the symptoms. The treatment provided no relief.    Past Medical History:  Diagnosis Date  . Diabetes mellitus without complication (HCC)    type 2   . Heart murmur    Hx: of as achild  . Hypertension   . Inguinal hernia    Hx: of left groin  . Kidney stones    Hx: of  . OA (osteoarthritis) of hip    Hx: of  . Sleep apnea    Uses CPAP    Patient Active Problem List   Diagnosis Date Noted  . Arthritis of right hip 05/06/2017  . History of total right hip replacement 02/07/2017  . Arthrodesis status 01/10/2015  . Postoperative fever 05/31/2014  . Acute respiratory failure with hypoxia (HCC) 05/31/2014  . Status post arthrodesis 05/29/2014  . Unilateral primary osteoarthritis, right hip 03/02/2013    Past Surgical History:    Procedure Laterality Date  . COLONOSCOPY     Hx: of  . FOOT ARTHRODESIS Left 05/29/2014   Procedure: Left Triple Arthrodesis, Possible Achilles Lengthening;  Surgeon: Eldred Manges, MD;  Location: [facility] OR;  Service: Orthopedics;  Laterality: Left;  . FOOT ARTHRODESIS Right 01/10/2015   Procedure: Right Triple Arthrodesis, Achilles Lengthening;  Surgeon: Eldred Manges, MD;  Location: [facility] OR;  Service: Orthopedics;  Laterality: Right;  . FOOT ARTHRODESIS, TRIPLE Left 05/29/2014  . HARDWARE REMOVAL Left 01/10/2015   Procedure: Left Foot Screw Removal ;  Surgeon: Eldred Manges, MD;  Location: [facility] OR;  Service: Orthopedics;  Laterality: Left;  . TOTAL HIP ARTHROPLASTY Left 03/02/2013   DR [name]  . TOTAL HIP ARTHROPLASTY Left 03/02/2013   Procedure: TOTAL HIP ARTHROPLASTY ANTERIOR APPROACH;  Surgeon: Eldred Manges, MD;  Location: [facility] OR;  Service: Orthopedics;  Laterality: Left;  Left Total Hip Arthroplasty Direct Anterior Approach  . TOTAL HIP ARTHROPLASTY Right 05/06/2017   Procedure: RIGHT TOTAL HIP ARTHROPLASTY DIRECT ANTERIOR;  Surgeon: Eldred Manges, MD;  Location: [facility] OR;  Service: Orthopedics;  Laterality: Right;        Home Medications    Prior to Admission medications   Medication Sig Start Date End Date Taking? Authorizing Provider  Ascorbic Acid (VITAMIN  C) 1000 MG tablet Take 1,000 mg by mouth daily.    [provider]  aspirin EC 81 MG tablet Take 81 mg by mouth daily.    [provider]  atorvastatin (LIPITOR) 10 MG tablet Take 10 mg by mouth daily.    [provider]  b complex vitamins tablet Take 1 tablet by mouth daily.    [provider]  carvedilol (COREG) 6.25 MG tablet Take 6.25 mg by mouth 2 (two) times daily with a meal.    [provider]  Cinnamon 500 MG TABS Take 1,000 mg by mouth daily.    [provider]  CONTOUR NEXT TEST test strip  01/26/17   [provider]  glimepiride (AMARYL) 4 MG tablet Take 4 mg by mouth 2  (two) times daily with a meal.     [provider]  glucose blood ([employer] TEST STRIPS) test strip Check blood sugar three times daily.  250.02  One touch ultra or other test strip of patient's choice. 07/25/12   [provider]  hydrochlorothiazide (HYDRODIURIL) 25 MG tablet Take 25 mg by mouth daily.     [provider]  Insulin Pen Needle (B-D ULTRAFINE III SHORT PEN) 31G X 8 MM MISC Inject insulin once daily. 05/31/12   [provider]  LANTUS SOLOSTAR 100 UNIT/ML Solostar Pen Inject 45-70 Units into the skin 2 (two) times daily. Inject 45 units in the morning & 70 units in the evening 03/03/17   [provider]  losartan (COZAAR) 100 MG tablet Take 100 mg by mouth daily.    [provider]  oxyCODONE-acetaminophen (PERCOCET/ROXICET) 5-325 MG tablet Take 1 tablet by mouth every 4 (four) hours as needed for severe pain. 05/08/17   Nadara Mustard, MD  sitaGLIPtin-metformin (JANUMET) 50-1000 MG tablet Take 1 tablet by mouth 2 (two) times daily.    [provider]    Family History Family History  Problem Relation Age of Onset  . Diabetes Mother   . Hypertension Mother   . Cancer - Other Mother   . Cancer - Lung Father   . Arthritis Sister   . Heart disease Sister     Social History Social History   Tobacco Use  . Smoking status: Former Smoker    Types: Cigars, Cigarettes  . Smokeless tobacco: Current User    Types: Snuff  . Tobacco comment: Quit smoking 1998  Substance Use Topics  . Alcohol use: Yes    Alcohol/week: 1.2 oz    Types: 2 Shots of liquor per week    Comment: once a week   . Drug use: No     Allergies   Shellfish allergy and Penicillins   Review of Systems Review of Systems  Constitutional: Negative for fever.  HENT: Negative for sore throat.   Eyes: Negative for visual disturbance.  Respiratory: Negative for shortness of breath.   Cardiovascular: Negative for chest pain.  Gastrointestinal: Negative  for abdominal pain.  Genitourinary: Negative for dysuria.  Musculoskeletal: Positive for arthralgias. Negative for back pain and neck pain.  Skin: Negative for rash.  Neurological: Negative for headaches.     Physical Exam Updated Vital Signs BP (!) 164/83 (BP Location: Right Arm)   Pulse 90   Temp 98 F (36.7 C) (Oral)   Resp 11   Ht  (1.88 m)   Wt (!) 154.7 kg (341 lb)   SpO2 96%   BMI 43.78 kg/m   Physical Exam  Constitutional:  He appears well-developed and well-nourished.  HENT:  Head: Normocephalic and atraumatic.  Eyes: Conjunctivae are normal.  Neck: Neck supple.  Cardiovascular: Normal rate and regular rhythm.  Pulmonary/Chest: Effort normal. No respiratory distress.  Abdominal: Soft. Bowel sounds are normal. He exhibits no mass. There is no tenderness.  Musculoskeletal:  Patient has tenderness through his right groin and hip.  There is limited range of motion which causes pain.  He is externally rotated on the right side.  He is got normal sensation and distal motor function.  There is some ecchymosis on his medial thigh on that side which she says is been there since the operation.  Neurological: He is alert. GCS eye subscore is 4. GCS verbal subscore is 5. GCS motor subscore is 6.  Skin: Skin is warm and dry.  Psychiatric: He has a normal mood and affect.  Nursing note and vitals reviewed.    ED Treatments / Results  Labs (all labs ordered are listed, but only abnormal results are displayed) Labs Reviewed - No data to display  EKG None  Radiology Dg Hip Port Unilat With Pelvis 1v Right  Result Date: 05/29/2017 CLINICAL DATA:  Reduction of right hip dislocation EXAM: DG HIP (WITH OR WITHOUT PELVIS) 1V PORT RIGHT COMPARISON:  Right hip radiographs from earlier today FINDINGS: Successful reduction of right hip dislocation, with no evidence of residual malalignment at the hip joints on this single frontal pelvic view. Bilateral total hip arthroplasty, with  no evidence of hardware fracture or loosening. No acute osseous fracture. No suspicious focal osseous lesions. IMPRESSION: Successful reduction of right hip dislocation, with no residual malalignment at the hip joints on this single frontal view. No evidence of hardware or osseous fracture. Electronically Signed   By: Delbert Phenix M.D.   On: 05/29/2017 11:29   Dg Hip Unilat With Pelvis 2-3 Views Right  Result Date: 05/29/2017 CLINICAL DATA:  Right hip pain, recent right total hip arthroplasty EXAM: DG HIP (WITH OR WITHOUT PELVIS) 2-3V RIGHT COMPARISON:  05/06/2017 pelvic and right hip radiograph FINDINGS: Superolateral right hip dislocation. Bilateral total hip arthroplasty. No evidence of hardware fracture. No acute osseous fracture. No evidence of left hip dislocation. No suspicious focal osseous lesions. IMPRESSION: Superolateral right hip dislocation. No evidence of hardware or osseous fracture. Electronically Signed   By: Delbert Phenix M.D.   On: 05/29/2017 09:23    Procedures .Sedation Date/Time: 05/30/2017 8:09 AM Performed by: Terrilee Files, MD Authorized by: Terrilee Files, MD   Consent:    Consent obtained:  Verbal and written   Consent given by:  Patient   Risks discussed:  Allergic reaction, dysrhythmia, inadequate sedation, nausea, prolonged hypoxia resulting in organ damage, prolonged sedation necessitating reversal, respiratory compromise necessitating ventilatory assistance and intubation and vomiting   Alternatives discussed:  Analgesia without sedation, anxiolysis and regional anesthesia Universal protocol:    Procedure explained and questions answered to patient or proxy's satisfaction: yes     Relevant documents present and verified: yes     Test results available and properly labeled: yes     Imaging studies available: yes     Required blood products, implants, devices, and special equipment available: yes     Site/side marked: yes     Immediately prior to procedure a  time out was called: yes     Patient identity confirmation method:  Verbally with patient Indications:    Procedure performed:  Dislocation reduction   Procedure necessitating sedation performed by:  Physician  performing sedation   Intended level of sedation:  Deep Pre-sedation assessment:    Time since last food or drink:  6p yesterday   ASA classification: class 2 - patient with mild systemic disease     Neck mobility: normal     Mouth opening:  3 or more finger widths   Thyromental distance:  4 finger widths   Mallampati score:  II - soft palate, uvula, fauces visible   Pre-sedation assessments completed and reviewed: airway patency, cardiovascular function, hydration status, mental status, nausea/vomiting, pain level, respiratory function and temperature   Immediate pre-procedure details:    Reassessment: Patient reassessed immediately prior to procedure     Reviewed: vital signs, relevant labs/tests and NPO status     Verified: bag valve mask available, emergency equipment available, intubation equipment available, IV patency confirmed, oxygen available and suction available   Procedure details (see MAR for exact dosages):    Preoxygenation:  Nasal cannula   Sedation:  Propofol   Intra-procedure monitoring:  Blood pressure monitoring, cardiac monitor, continuous pulse oximetry, frequent LOC assessments, frequent vital sign checks and continuous capnometry   Intra-procedure events: respiratory depression     Intra-procedure management:  Airway repositioning and supplemental oxygen   Total Provider sedation time (minutes):  20 Post-procedure details:    Attendance: Constant attendance by certified staff until patient recovered     Recovery: Patient returned to pre-procedure baseline     Post-sedation assessments completed and reviewed: airway patency, cardiovascular function, hydration status, mental status, nausea/vomiting, pain level, respiratory function and temperature     Patient  is stable for discharge or admission: yes     Patient tolerance:  Tolerated well, no immediate complications Comments:     See nursing notes for times Reduction of dislocation Date/Time: 05/30/2017 8:12 AM Performed by: Terrilee Files, MD Authorized by: Terrilee Files, MD  Consent: Verbal consent obtained. Written consent obtained. Risks and benefits: risks, benefits and alternatives were discussed Consent given by: patient Patient understanding: patient states understanding of the procedure being performed Patient consent: the patient's understanding of the procedure matches consent given Procedure consent: procedure consent matches procedure scheduled Relevant documents: relevant documents present and verified Test results: test results available and properly labeled Imaging studies: imaging studies available Patient identity confirmed: verbally with patient and arm band Time out: Immediately prior to procedure a "time out" was called to verify the correct patient, procedure, equipment, support staff and site/side marked as required.  Sedation: Patient sedated: yes Sedatives: propofol Vitals: Vital signs were monitored during sedation.  Patient tolerance: Patient tolerated the procedure well with no immediate complications Comments: See nursing note for sedation times. Patient r leg held in traction and with internal rotation easily reduced hip. Pelvis xray after procedure documents reduction     (including critical care time)  Medications Ordered in ED Medications  HYDROmorphone (DILAUDID) injection 1 mg (has no administration in time range)  sodium chloride 0.9 % bolus 1,000 mL (has no administration in time range)  ondansetron (ZOFRAN) injection 4 mg (has no administration in time range)     Initial Impression / Assessment and Plan / ED Course  I have reviewed the triage vital signs and the nursing notes.  Pertinent labs & imaging results that were available during  my care of the patient were reviewed by me and considered in my medical decision making (see chart for details).  Clinical Course as of May 31 807  Wynelle Link May 29, 2017  0930 Discussed with  [facility] on call.  They recommend attempted reduction with longitudinal traction and internal rotation.  They are going to the operating room for another case but would be available if we are unsuccessful.   [MB]  1105 Hip reduced under propofol sedation.  Screening pelvis shows good reduction.  Patient has a transient desat that improved with airway positioning.  Orthopedics paged for plan.   [MB]  1131 Discussed with [facility] who felt he could leave with anterior hip precautions instructions and follow-up with Dr. [name].  I reviewed this with the patient and his wife and they were hoping to speak with the orthopedist who I have paged again   [MB]    Clinical Course User Index [MB] Terrilee Files, MD     Final Clinical Impressions(s) / ED Diagnoses   Final diagnoses:  Dislocation of right hip, initial encounter [facility])    ED Discharge Orders    None       Terrilee Files, MD 05/30/17 [phone]

## 2017-05-30 ENCOUNTER — Encounter (INDEPENDENT_AMBULATORY_CARE_PROVIDER_SITE_OTHER): Payer: Self-pay | Admitting: Orthopaedic Surgery

## 2017-05-30 ENCOUNTER — Ambulatory Visit (INDEPENDENT_AMBULATORY_CARE_PROVIDER_SITE_OTHER): Payer: BLUE CROSS/BLUE SHIELD | Admitting: Orthopaedic Surgery

## 2017-05-30 VITALS — BP 127/78 | HR 87 | Ht 74.0 in | Wt 345.0 lb

## 2017-05-30 DIAGNOSIS — S73004D Unspecified dislocation of right hip, subsequent encounter: Secondary | ICD-10-CM

## 2017-05-30 NOTE — Progress Notes (Signed)
Post-Op Visit Note   Patient: [name] [name]           Date of Birth: [birth year]           MRN: [id number] Visit Date: 05/30/2017 PCP: Rolm Gala, MD   Assessment & Plan: Post right total hip arthroplasty on 05/06/2017 with acute hip dislocation which was reduced in the emergency room by the ER staff under conscious sedation.  He had some bruising in his thigh as expected.  Work slip given for no work x3 weeks.  Office follow-up 2 weeks.  Chief Complaint:  Chief Complaint  Patient presents with  . Right Hip - Pain, Follow-up    S/p right hip dislocation 05/30/17, and reduction.  Right THA, direct anterior 05/06/17   Visit Diagnoses:  1. Hip dislocation, right, subsequent encounter     Plan: He will stop his home exercises to work on daily walking.  We discussed the way anterior hip can lever out.  Avoid the recliner until he 6 weeks out from his acute dislocation.  Follow-Up Instructions: No follow-ups on file.   Orders:  No orders of the defined types were placed in this encounter.  No orders of the defined types were placed in this encounter.   Imaging: No results found.  PMFS History: Patient Active Problem List   Diagnosis Date Noted  . Arthritis of right hip 05/06/2017  . History of total right hip replacement 02/07/2017  . Arthrodesis status 01/10/2015  . Postoperative fever 05/31/2014  . Acute respiratory failure with hypoxia (HCC) 05/31/2014  . Status post arthrodesis 05/29/2014  . Unilateral primary osteoarthritis, right hip 03/02/2013   Past Medical History:  Diagnosis Date  . Diabetes mellitus without complication (HCC)    type 2   . Heart murmur    Hx: of as achild  . Hypertension   . Inguinal hernia    Hx: of left groin  . Kidney stones    Hx: of  . OA (osteoarthritis) of hip    Hx: of  . Sleep apnea    Uses CPAP    Family History  Problem Relation Age of Onset  . Diabetes Mother   . Hypertension Mother   . Cancer - Other Mother     . Cancer - Lung Father   . Arthritis Sister   . Heart disease Sister     Past Surgical History:  Procedure Laterality Date  . COLONOSCOPY     Hx: of  . FOOT ARTHRODESIS Left 05/29/2014   Procedure: Left Triple Arthrodesis, Possible Achilles Lengthening;  Surgeon: Eldred Manges, MD;  Location: [facility] OR;  Service: Orthopedics;  Laterality: Left;  . FOOT ARTHRODESIS Right 01/10/2015   Procedure: Right Triple Arthrodesis, Achilles Lengthening;  Surgeon: Eldred Manges, MD;  Location: [facility] OR;  Service: Orthopedics;  Laterality: Right;  . FOOT ARTHRODESIS, TRIPLE Left 05/29/2014  . HARDWARE REMOVAL Left 01/10/2015   Procedure: Left Foot Screw Removal ;  Surgeon: Eldred Manges, MD;  Location: [facility] OR;  Service: Orthopedics;  Laterality: Left;  . TOTAL HIP ARTHROPLASTY Left 03/02/2013   DR [name]  . TOTAL HIP ARTHROPLASTY Left 03/02/2013   Procedure: TOTAL HIP ARTHROPLASTY ANTERIOR APPROACH;  Surgeon: Eldred Manges, MD;  Location: [facility] OR;  Service: Orthopedics;  Laterality: Left;  Left Total Hip Arthroplasty Direct Anterior Approach  . TOTAL HIP ARTHROPLASTY Right 05/06/2017   Procedure: RIGHT TOTAL HIP ARTHROPLASTY DIRECT ANTERIOR;  Surgeon: Eldred Manges, MD;  Location: [facility] OR;  Service:  Orthopedics;  Laterality: Right;   Social History   Occupational History  . Not on file  Tobacco Use  . Smoking status: Former Smoker    Types: Cigars, Cigarettes  . Smokeless tobacco: Current User    Types: Snuff  . Tobacco comment: Quit smoking 1998  Substance and Sexual Activity  . Alcohol use: Yes    Alcohol/week: 1.2 oz    Types: 2 Shots of liquor per week    Comment: once a week   . Drug use: No  . Sexual activity: Not on file

## 2017-06-15 ENCOUNTER — Ambulatory Visit (INDEPENDENT_AMBULATORY_CARE_PROVIDER_SITE_OTHER): Payer: BLUE CROSS/BLUE SHIELD | Admitting: Orthopaedic Surgery

## 2017-06-15 ENCOUNTER — Encounter (INDEPENDENT_AMBULATORY_CARE_PROVIDER_SITE_OTHER): Payer: Self-pay | Admitting: Orthopaedic Surgery

## 2017-06-15 VITALS — BP 130/75 | HR 82 | Ht 74.0 in | Wt 345.0 lb

## 2017-06-15 DIAGNOSIS — S73004D Unspecified dislocation of right hip, subsequent encounter: Secondary | ICD-10-CM

## 2017-06-15 DIAGNOSIS — S73004A Unspecified dislocation of right hip, initial encounter: Secondary | ICD-10-CM | POA: Insufficient documentation

## 2017-06-15 NOTE — Progress Notes (Signed)
Post-Op Visit Note   Patient: [name] [name]           Date of Birth: 12-Feb-1960           MRN: [id number] Visit Date: 06/15/2017 PCP: Rolm Gala, MD   Assessment & Plan:  Chief Complaint:  Chief Complaint  Patient presents with  . Right Hip - Follow-up   Visit Diagnoses:  1. Dislocation of right hip, subsequent encounter     Plan: Patient had total hip arthroplasty 05/06/2017.  He was in a recliner rolling around tossing turning and rolled to straighten up had sudden sharp pain in his hip and had a postop total hip arthroplasty dislocation.  Hip approach was on the right x-rays look good.  Patient had conscious sedation with reduction in good reduction.  He is now 3 weeks out and is been sleeping in the bed without problems.  He requested a work note resuming work on 06/21/2017 for just work for 4 weeks.  I plan to recheck him in a month.  He had some ecchymosis in his thigh as expected post hip dislocation and reduction.  Some pain in the abductor right side of his testicle.  Follow-Up Instructions: No follow-ups on file.   Orders:  No orders of the defined types were placed in this encounter.  No orders of the defined types were placed in this encounter.   Imaging: No results found.  PMFS History: Patient Active Problem List   Diagnosis Date Noted  . Arthritis of right hip 05/06/2017  . History of total right hip replacement 02/07/2017  . Arthrodesis status 01/10/2015  . Postoperative fever 05/31/2014  . Acute respiratory failure with hypoxia (HCC) 05/31/2014  . Status post arthrodesis 05/29/2014  . Unilateral primary osteoarthritis, right hip 03/02/2013   Past Medical History:  Diagnosis Date  . Diabetes mellitus without complication (HCC)    type 2   . Heart murmur    Hx: of as achild  . Hypertension   . Inguinal hernia    Hx: of left groin  . Kidney stones    Hx: of  . OA (osteoarthritis) of hip    Hx: of  . Sleep apnea    Uses CPAP    Family  History  Problem Relation Age of Onset  . Diabetes Mother   . Hypertension Mother   . Cancer - Other Mother   . Cancer - Lung Father   . Arthritis Sister   . Heart disease Sister     Past Surgical History:  Procedure Laterality Date  . COLONOSCOPY     Hx: of  . FOOT ARTHRODESIS Left 05/29/2014   Procedure: Left Triple Arthrodesis, Possible Achilles Lengthening;  Surgeon: Eldred Manges, MD;  Location: [facility] OR;  Service: Orthopedics;  Laterality: Left;  . FOOT ARTHRODESIS Right 01/10/2015   Procedure: Right Triple Arthrodesis, Achilles Lengthening;  Surgeon: Eldred Manges, MD;  Location: [facility] OR;  Service: Orthopedics;  Laterality: Right;  . FOOT ARTHRODESIS, TRIPLE Left 05/29/2014  . HARDWARE REMOVAL Left 01/10/2015   Procedure: Left Foot Screw Removal ;  Surgeon: Eldred Manges, MD;  Location: [facility] OR;  Service: Orthopedics;  Laterality: Left;  . TOTAL HIP ARTHROPLASTY Left 03/02/2013   DR [name]  . TOTAL HIP ARTHROPLASTY Left 03/02/2013   Procedure: TOTAL HIP ARTHROPLASTY ANTERIOR APPROACH;  Surgeon: Eldred Manges, MD;  Location: [facility] OR;  Service: Orthopedics;  Laterality: Left;  Left Total Hip Arthroplasty Direct Anterior Approach  . TOTAL HIP ARTHROPLASTY  Right 05/06/2017   Procedure: RIGHT TOTAL HIP ARTHROPLASTY DIRECT ANTERIOR;  Surgeon: Eldred Manges, MD;  Location: [facility] OR;  Service: Orthopedics;  Laterality: Right;   Social History   Occupational History  . Not on file  Tobacco Use  . Smoking status: Former Smoker    Types: Cigars, Cigarettes  . Smokeless tobacco: Current User    Types: Snuff  . Tobacco comment: Quit smoking 1998  Substance and Sexual Activity  . Alcohol use: Yes    Alcohol/week: 1.2 oz    Types: 2 Shots of liquor per week    Comment: once a week   . Drug use: No  . Sexual activity: Not on file

## 2017-06-21 ENCOUNTER — Ambulatory Visit (INDEPENDENT_AMBULATORY_CARE_PROVIDER_SITE_OTHER): Payer: BLUE CROSS/BLUE SHIELD | Admitting: Orthopaedic Surgery

## 2017-07-04 DIAGNOSIS — E1165 Type 2 diabetes mellitus with hyperglycemia: Secondary | ICD-10-CM | POA: Diagnosis not present

## 2017-07-04 DIAGNOSIS — E7800 Pure hypercholesterolemia, unspecified: Secondary | ICD-10-CM | POA: Diagnosis not present

## 2017-07-12 ENCOUNTER — Ambulatory Visit (INDEPENDENT_AMBULATORY_CARE_PROVIDER_SITE_OTHER): Payer: BLUE CROSS/BLUE SHIELD | Admitting: Orthopaedic Surgery

## 2017-07-12 ENCOUNTER — Ambulatory Visit (INDEPENDENT_AMBULATORY_CARE_PROVIDER_SITE_OTHER): Payer: BLUE CROSS/BLUE SHIELD

## 2017-07-12 ENCOUNTER — Encounter (INDEPENDENT_AMBULATORY_CARE_PROVIDER_SITE_OTHER): Payer: Self-pay | Admitting: Orthopaedic Surgery

## 2017-07-12 VITALS — BP 144/87 | HR 82 | Ht 74.0 in | Wt 331.0 lb

## 2017-07-12 DIAGNOSIS — M545 Low back pain: Secondary | ICD-10-CM | POA: Diagnosis not present

## 2017-07-12 NOTE — Progress Notes (Signed)
Office Visit Note   Patient: [name] [name]           Date of Birth: 04/29/1960           MRN: [id number] Visit Date: 07/12/2017              Requested by: Rolm Gala, MD [address] [place], [place] [address] PCP: Rolm Gala, MD   Assessment & Plan: Visit Diagnoses:  1. Acute right-sided low back pain, with sciatica presence unspecified     Plan: Patient states that post hip dislocation is not getting well will enough around to go back to regular duty.  He still having some problems with his back at this point we will defer lumbar MRI imaging.  Follow-Up Instructions: No follow-ups on file.   Orders:  Orders Placed This Encounter  Procedures  . XR Lumbar Spine 2-3 Views   No orders of the defined types were placed in this encounter.     Procedures: No procedures performed   Clinical Data: No additional findings.   Subjective: Chief Complaint  Patient presents with  . Lower Back - Pain  . Right Hip - Pain    HPI patient returns post right postop hip dislocation on 5/5 2019.  Right hip was 05/06/2017.  He is been walking abnormally has had increased back pain.  Pain radiates from his back into his buttocks and into his right leg does not go past his knee.  He is never had any previous x-rays of his back.  He denies associated bowel or bladder symptoms.  He has some start up pain in his right thigh did not notice any when he had his left total hip arthroplasty which was done in 2015.  He denies associated bowel bladder symptoms no chills or fever.  He has been using some topical cream that tends to help.  When he walks a lot he has some increased fatigue.  Aching pain that is in his thigh.  Review of Systems reviewed updated unchanged.   Objective: Vital Signs: BP (!) 144/87   Pulse 82   Ht 6\' 2"  (1.88 m)   Wt (!) 331 lb (150.1 kg)   BMI 42.50 kg/m   Physical Exam  Constitutional: He is oriented to person, place, and time. He appears  well-developed and well-nourished.  HENT:  Head: Normocephalic and atraumatic.  Eyes: Pupils are equal, round, and reactive to light. EOM are normal.  Neck: No tracheal deviation present. No thyromegaly present.  Cardiovascular: Normal rate.  Pulmonary/Chest: Effort normal. He has no wheezes.  Abdominal: Soft. Bowel sounds are normal.  Neurological: He is alert and oriented to person, place, and time.  Skin: Skin is warm and dry. Capillary refill takes less than 2 seconds.  Psychiatric: He has a normal mood and affect. His behavior is normal. Judgment and thought content normal.    Ortho Exam patient has some right sciatic notch tenderness incisions well-healed.  He has some tenderness palpation of the lumbar spine.  Negative logroll to the hips.  Specialty Comments:  No specialty comments available.  Imaging: No results found.   PMFS History: Patient Active Problem List   Diagnosis Date Noted  . Hip dislocation, right (HCC) 06/15/2017  . Arthritis of right hip 05/06/2017  . History of total right hip replacement 02/07/2017  . Arthrodesis status 01/10/2015  . Postoperative fever 05/31/2014  . Acute respiratory failure with hypoxia (HCC) 05/31/2014  . Status post arthrodesis 05/29/2014  . Unilateral primary osteoarthritis,  right hip 03/02/2013   Past Medical History:  Diagnosis Date  . Diabetes mellitus without complication (HCC)    type 2   . Heart murmur    Hx: of as achild  . Hypertension   . Inguinal hernia    Hx: of left groin  . Kidney stones    Hx: of  . OA (osteoarthritis) of hip    Hx: of  . Sleep apnea    Uses CPAP    Family History  Problem Relation Age of Onset  . Diabetes Mother   . Hypertension Mother   . Cancer - Other Mother   . Cancer - Lung Father   . Arthritis Sister   . Heart disease Sister     Past Surgical History:  Procedure Laterality Date  . COLONOSCOPY     Hx: of  . FOOT ARTHRODESIS Left 05/29/2014   Procedure: Left Triple  Arthrodesis, Possible Achilles Lengthening;  Surgeon: Eldred MangesMark C Waynetta Metheny, MD;  Location: [facility] OR;  Service: Orthopedics;  Laterality: Left;  . FOOT ARTHRODESIS Right 01/10/2015   Procedure: Right Triple Arthrodesis, Achilles Lengthening;  Surgeon: Eldred MangesMark C Mariona Scholes, MD;  [facility];  Service: Orthopedics;  Laterality: Right;  . FOOT ARTHRODESIS, TRIPLE Left 05/29/2014  . HARDWARE REMOVAL Left 01/10/2015   Procedure: Left Foot Screw Removal ;  Surgeon: Eldred MangesMark C Timberlee Roblero, MD;  [facility];  Service: Orthopedics;  Laterality: Left;  . TOTAL HIP ARTHROPLASTY Left 03/02/2013   DR Ophelia CharterYATES  . TOTAL HIP ARTHROPLASTY Left 03/02/2013   Procedure: TOTAL HIP ARTHROPLASTY ANTERIOR APPROACH;  Surgeon: Eldred MangesMark C Ife Vitelli, MD;  Location: [facility] OR;  Service: Orthopedics;  Laterality: Left;  Left Total Hip Arthroplasty Direct Anterior Approach  . TOTAL HIP ARTHROPLASTY Right 05/06/2017   Procedure: RIGHT TOTAL HIP ARTHROPLASTY DIRECT ANTERIOR;  Surgeon: Eldred MangesYates, Laynie Espy C, MD;  Location: [facility] OR;  Service: Orthopedics;  Laterality: Right;   Social History   Occupational History  . Not on file  Tobacco Use  . Smoking status: Former Smoker    Types: Cigars, Cigarettes  . Smokeless tobacco: Current User    Types: Snuff  . Tobacco comment: Quit smoking 1998  Substance and Sexual Activity  . Alcohol use: Yes    Alcohol/week: 1.2 oz    Types: 2 Shots of liquor per week    Comment: once a week   . Drug use: No  . Sexual activity: Not on file

## 2017-07-19 ENCOUNTER — Ambulatory Visit (INDEPENDENT_AMBULATORY_CARE_PROVIDER_SITE_OTHER): Payer: BLUE CROSS/BLUE SHIELD | Admitting: Orthopaedic Surgery

## 2017-08-10 DIAGNOSIS — E1165 Type 2 diabetes mellitus with hyperglycemia: Secondary | ICD-10-CM | POA: Diagnosis not present

## 2017-08-10 DIAGNOSIS — E7800 Pure hypercholesterolemia, unspecified: Secondary | ICD-10-CM | POA: Diagnosis not present

## 2017-08-10 DIAGNOSIS — R3129 Other microscopic hematuria: Secondary | ICD-10-CM | POA: Diagnosis not present

## 2017-08-10 DIAGNOSIS — I10 Essential (primary) hypertension: Secondary | ICD-10-CM | POA: Diagnosis not present

## 2017-08-16 ENCOUNTER — Encounter (INDEPENDENT_AMBULATORY_CARE_PROVIDER_SITE_OTHER): Payer: Self-pay | Admitting: Orthopaedic Surgery

## 2017-08-16 ENCOUNTER — Ambulatory Visit (INDEPENDENT_AMBULATORY_CARE_PROVIDER_SITE_OTHER): Payer: BLUE CROSS/BLUE SHIELD | Admitting: Orthopaedic Surgery

## 2017-08-16 VITALS — BP 132/88 | HR 89 | Ht 74.0 in | Wt 330.0 lb

## 2017-08-16 DIAGNOSIS — Z96641 Presence of right artificial hip joint: Secondary | ICD-10-CM

## 2017-08-16 DIAGNOSIS — S73004D Unspecified dislocation of right hip, subsequent encounter: Secondary | ICD-10-CM

## 2017-08-16 NOTE — Progress Notes (Signed)
Office Visit Note   Patient: [name] [name]           Date of Birth: March 20, 1960           MRN: [id number] Visit Date: 08/16/2017              Requested by: Rolm Gala, MD [address] [place], [place] [address] PCP: Rolm Gala, MD   Assessment & Plan: Visit Diagnoses:  1. Dislocation of right hip, subsequent encounter   2. History of total right hip replacement     Plan: Continue weight loss.  Work slip given for regular work no restrictions.  I plan to recheck him in 4 months.  Lumbar x-rays showed some degenerative spurring he has had gradual increase in back symptoms that seem to do better when he is in a forward flexed position at his hips.  Hopefully all his symptoms will settle down.  He is doing well post bilateral triple arthrodeses.  Follow-Up Instructions: Return in about 4 months (around 12/17/2017).   Orders:  No orders of the defined types were placed in this encounter.  No orders of the defined types were placed in this encounter.     Procedures: No procedures performed   Clinical Data: No additional findings.   Subjective: Chief Complaint  Patient presents with  . Right Hip - Follow-up  . Lower Back - Follow-up    HPI 57 year old male returns post right hip dislocation on his right hip that occurred on 05/29/2017 almost 1 month post right total hip arthroplasty.  He dislocated hip trying to get up out of her recliner throwing his leg over the side.  He states she is ready to resume regular work and is been on modified work since I last saw him.  He states he has some aching pain in the groin at times may be 50% of the time and the other 50% he goes for the entire day and has no pain.  Sometimes he notes he has more discomfort in his lower back if he does not lean forward slightly.  He denies classic claudication symptoms.  Review of Systems reviewed updated unchanged.   Objective: Vital Signs: BP 132/88   Pulse 89   Ht 6\' 2"  (1.88 m)    Wt (!) 330 lb (149.7 kg)   BMI 42.37 kg/m   Physical Exam  Constitutional: He is oriented to person, place, and time. He appears well-developed and well-nourished.  HENT:  Head: Normocephalic and atraumatic.  Eyes: Pupils are equal, round, and reactive to light. EOM are normal.  Neck: No tracheal deviation present. No thyromegaly present.  Cardiovascular: Normal rate.  Pulmonary/Chest: Effort normal. He has no wheezes.  Abdominal: Soft. Bowel sounds are normal.  Neurological: He is alert and oriented to person, place, and time.  Skin: Skin is warm and dry. Capillary refill takes less than 2 seconds.  Psychiatric: He has a normal mood and affect. His behavior is normal. Judgment and thought content normal.    Ortho Exam well-healed hip incision right and left.  Leg lengths equal.  Specialty Comments:  No specialty comments available.  Imaging: No results found.   PMFS History: Patient Active Problem List   Diagnosis Date Noted  . Hip dislocation, right (HCC) 06/15/2017  . Arthritis of right hip 05/06/2017  . History of total right hip replacement 02/07/2017  . Arthrodesis status 01/10/2015  . Postoperative fever 05/31/2014  . Acute respiratory failure with hypoxia (HCC) 05/31/2014  . Status post  arthrodesis 05/29/2014  . Unilateral primary osteoarthritis, right hip 03/02/2013   Past Medical History:  Diagnosis Date  . Diabetes mellitus without complication (HCC)    type 2   . Heart murmur    Hx: of as achild  . Hypertension   . Inguinal hernia    Hx: of left groin  . Kidney stones    Hx: of  . OA (osteoarthritis) of hip    Hx: of  . Sleep apnea    Uses CPAP    Family History  Problem Relation Age of Onset  . Diabetes Mother   . Hypertension Mother   . Cancer - Other Mother   . Cancer - Lung Father   . Arthritis Sister   . Heart disease Sister     Past Surgical History:  Procedure Laterality Date  . COLONOSCOPY     Hx: of  . FOOT ARTHRODESIS Left  05/29/2014   Procedure: Left Triple Arthrodesis, Possible Achilles Lengthening;  Surgeon: Eldred MangesMark C Rondalyn Belford, MD;  Location: [facility] OR;  Service: Orthopedics;  Laterality: Left;  . FOOT ARTHRODESIS Right 01/10/2015   Procedure: Right Triple Arthrodesis, Achilles Lengthening;  Surgeon: Eldred MangesMark C Alarik Radu, MD;  [facility];  Service: Orthopedics;  Laterality: Right;  . FOOT ARTHRODESIS, TRIPLE Left 05/29/2014  . HARDWARE REMOVAL Left 01/10/2015   Procedure: Left Foot Screw Removal ;  Surgeon: Eldred MangesMark C Alyssha Housh, MD;  [facility];  Service: Orthopedics;  Laterality: Left;  . TOTAL HIP ARTHROPLASTY Left 03/02/2013   DR Ophelia CharterYATES  . TOTAL HIP ARTHROPLASTY Left 03/02/2013   Procedure: TOTAL HIP ARTHROPLASTY ANTERIOR APPROACH;  Surgeon: Eldred MangesMark C Latif Nazareno, MD;  Location: [facility] OR;  Service: Orthopedics;  Laterality: Left;  Left Total Hip Arthroplasty Direct Anterior Approach  . TOTAL HIP ARTHROPLASTY Right 05/06/2017   Procedure: RIGHT TOTAL HIP ARTHROPLASTY DIRECT ANTERIOR;  Surgeon: Eldred MangesYates, Lorrayne Ismael C, MD;  Location: [facility] OR;  Service: Orthopedics;  Laterality: Right;   Social History   Occupational History  . Not on file  Tobacco Use  . Smoking status: Former Smoker    Types: Cigars, Cigarettes  . Smokeless tobacco: Current User    Types: Snuff  . Tobacco comment: Quit smoking 1998  Substance and Sexual Activity  . Alcohol use: Yes    Alcohol/week: 1.2 oz    Types: 2 Shots of liquor per week    Comment: once a week   . Drug use: No  . Sexual activity: Not on file

## 2017-11-27 ENCOUNTER — Encounter (INDEPENDENT_AMBULATORY_CARE_PROVIDER_SITE_OTHER): Payer: Self-pay | Admitting: Orthopaedic Surgery

## 2017-12-20 ENCOUNTER — Other Ambulatory Visit (INDEPENDENT_AMBULATORY_CARE_PROVIDER_SITE_OTHER): Payer: Self-pay

## 2017-12-20 ENCOUNTER — Ambulatory Visit (INDEPENDENT_AMBULATORY_CARE_PROVIDER_SITE_OTHER): Payer: Self-pay

## 2017-12-20 ENCOUNTER — Encounter (INDEPENDENT_AMBULATORY_CARE_PROVIDER_SITE_OTHER): Payer: Self-pay | Admitting: Orthopaedic Surgery

## 2017-12-20 ENCOUNTER — Ambulatory Visit (INDEPENDENT_AMBULATORY_CARE_PROVIDER_SITE_OTHER): Payer: BLUE CROSS/BLUE SHIELD | Admitting: Orthopaedic Surgery

## 2017-12-20 VITALS — BP 142/98 | HR 92

## 2017-12-20 DIAGNOSIS — Z96641 Presence of right artificial hip joint: Secondary | ICD-10-CM

## 2017-12-20 DIAGNOSIS — M4807 Spinal stenosis, lumbosacral region: Secondary | ICD-10-CM

## 2017-12-20 NOTE — Progress Notes (Signed)
Office Visit Note   Patient: [name] [name]           Date of Birth: 1960/06/07           MRN: [id number] Visit Date: 12/20/2017              Requested by: Rolm Gala, MD [address] [place], [place] [address] PCP: Rolm Gala, MD   Assessment & Plan: Visit Diagnoses:  1. History of total right hip replacement   2. Morbid (severe) obesity due to excess calories (HCC)           Right anterior hip dislocation 3 weeks postop on 05/29/2017.  Plan: Patient has weakness of his right hip.  He is having popping with walking after postop hip dislocation and pain in anterior thigh.  Previous lumbar radiographs demonstrated L1 to disc space narrowing and anterior ,posterior spurring.  Endplate spurring and narrowing at L5-S1.  Will obtain an MRI to make sure he does not have any nerve compression that could be contributing to his right hip weakness.  He may have some scar tissue with tendons popping anteriorly over the hip joint.  Office follow-up after lumbar MRI scan.  We discussed the dining weight loss.  Currently with his hip giving him more problems he is been less active which is contributed to him actually gaining some weight.  Follow-up after MRI lumbar.  Follow-Up Instructions: No follow-ups on file.   Orders:  Orders Placed This Encounter  Procedures  . XR HIP UNILAT W OR W/O PELVIS 2-3 VIEWS RIGHT   No orders of the defined types were placed in this encounter.     Procedures: No procedures performed   Clinical Data: No additional findings.   Subjective: Chief Complaint  Patient presents with  . Right Hip - Follow-up    05/06/17 Right THA    HPI 57 year old male returns post right total hip arthroplasty 05/06/2017.  Patient states he has been having pain anteriorly in his right thigh with pain in his groin with pulling type sensation.  When he walks sometimes it feels like a tendon is popping over the anterior aspect of his right hip.  Total hip arthroplasty  was on 05/06/2017 and 3 weeks postop he suffered a hip dislocation as he is trying to push himself up from a recliner pushing on the arms with his leg still out on the leg rest with anterior dislocation on 05/29/2017.  Hip was popped in the ER.  He states the anterior pain in his thigh and popping feels similar to right after he had his total hip procedure.  He had bilateral triple arthrodesis in the past for severe planovalgus deformity.  Standing position he is had slight pelvic obliquity.  Patient is here with his wife.  He still continuing to work but at times when he walks he feels popping anteriorly in his groin.  Patient denies chills or fevers at time he walks and does not have the popping.  He has pain in his right groin when he first gets up and ambulates with a short stride gait  when he first gets up.  Review of Systems as for type 2 diabetes inguinal hernia kidney stones.  Bilateral total hip arthroplasties.  Right THA anterior dislocation May 2019.  Lumbar L1-2 disc space narrowing with spurring.  Sleep apnea with CPAP.  Bilateral triple arthrodesis   Objective: Vital Signs: BP (!) 142/98   Pulse 92   Physical Exam  Constitutional: He is  oriented to person, place, and time. He appears well-developed and well-nourished.  HENT:  Head: Normocephalic and atraumatic.  Eyes: Pupils are equal, round, and reactive to light. EOM are normal.  Neck: No tracheal deviation present. No thyromegaly present.  Cardiovascular: Normal rate.  Pulmonary/Chest: Effort normal. He has no wheezes.  Abdominal: Soft. Bowel sounds are normal.  Neurological: He is alert and oriented to person, place, and time.  Skin: Skin is warm and dry. Capillary refill takes less than 2 seconds.  Psychiatric: He has a normal mood and affect. His behavior is normal. Judgment and thought content normal.    Ortho Exam patient knees reach full extension.  Sitting position thigh lengths look symmetrical.  Patient ambulates with a  short stride gait when he first gets up uses arms to get from sitting to standing.  He has some weakness on hip flexors right more than left.  Specialty Comments:  No specialty comments available.  Imaging: Xr Hip Unilat W Or W/o Pelvis 2-3 Views Right  Result Date: 12/20/2017 AP pelvis standing AP pelvis and frog-leg right hip are obtained and reviewed.  Standing films he has slight pelvic obliquity with right side 10 mm short.  No evidence of subsidence or loosening.  Acetabular cup is in 45 degrees abduction. Impression: Post bilateral total hip arthroplasties.  No evidence of loosening or subsidence.    PMFS History: Patient Active Problem List   Diagnosis Date Noted  . Morbid (severe) obesity due to excess calories (HCC) 12/20/2017  . Hip dislocation, right (HCC) 06/15/2017  . Arthritis of right hip 05/06/2017  . History of total right hip replacement 02/07/2017  . Arthrodesis status 01/10/2015  . Postoperative fever 05/31/2014  . Acute respiratory failure with hypoxia (HCC) 05/31/2014  . Status post arthrodesis 05/29/2014  . Unilateral primary osteoarthritis, right hip 03/02/2013   Past Medical History:  Diagnosis Date  . Diabetes mellitus without complication (HCC)    type 2   . Heart murmur    Hx: of as achild  . Hypertension   . Inguinal hernia    Hx: of left groin  . Kidney stones    Hx: of  . OA (osteoarthritis) of hip    Hx: of  . Sleep apnea    Uses CPAP    Family History  Problem Relation Age of Onset  . Diabetes Mother   . Hypertension Mother   . Cancer - Other Mother   . Cancer - Lung Father   . Arthritis Sister   . Heart disease Sister     Past Surgical History:  Procedure Laterality Date  . COLONOSCOPY     Hx: of  . FOOT ARTHRODESIS Left 05/29/2014   Procedure: Left Triple Arthrodesis, Possible Achilles Lengthening;  Surgeon: Eldred Manges, MD;  Location: [facility] OR;  Service: Orthopedics;  Laterality: Left;  . FOOT ARTHRODESIS Right 01/10/2015    Procedure: Right Triple Arthrodesis, Achilles Lengthening;  Surgeon: Eldred Manges, MD;  Location: [facility] OR;  Service: Orthopedics;  Laterality: Right;  . FOOT ARTHRODESIS, TRIPLE Left 05/29/2014  . HARDWARE REMOVAL Left 01/10/2015   Procedure: Left Foot Screw Removal ;  Surgeon: Eldred Manges, MD;  Location: [facility];  Service: Orthopedics;  Laterality: Left;  . TOTAL HIP ARTHROPLASTY Left 03/02/2013   DR [name]  . TOTAL HIP ARTHROPLASTY Left 03/02/2013   Procedure: TOTAL HIP ARTHROPLASTY ANTERIOR APPROACH;  Surgeon: Eldred Manges, MD;  Location: [facility] OR;  Service: Orthopedics;  Laterality: Left;  Left Total Hip  Arthroplasty Direct Anterior Approach  . TOTAL HIP ARTHROPLASTY Right 05/06/2017   Procedure: RIGHT TOTAL HIP ARTHROPLASTY DIRECT ANTERIOR;  Surgeon: Eldred MangesYates, Sreshta Cressler C, MD;  Location: [facility] OR;  Service: Orthopedics;  Laterality: Right;   Social History   Occupational History  . Not on file  Tobacco Use  . Smoking status: Former Smoker    Types: Cigars, Cigarettes  . Smokeless tobacco: Current User    Types: Snuff  . Tobacco comment: Quit smoking 1998  Substance and Sexual Activity  . Alcohol use: Yes    Alcohol/week: 2.0 standard drinks    Types: 2 Shots of liquor per week    Comment: once a week   . Drug use: No  . Sexual activity: Not on file

## 2017-12-29 ENCOUNTER — Encounter (INDEPENDENT_AMBULATORY_CARE_PROVIDER_SITE_OTHER): Payer: Self-pay | Admitting: Radiology

## 2017-12-30 ENCOUNTER — Telehealth (INDEPENDENT_AMBULATORY_CARE_PROVIDER_SITE_OTHER): Payer: Self-pay | Admitting: Orthopaedic Surgery

## 2017-12-30 NOTE — Telephone Encounter (Signed)
Please call patient to follow up on them about MRI

## 2018-01-03 NOTE — Telephone Encounter (Signed)
Have you seen the MRI report come through on fax  from [facility] yet?

## 2018-01-03 NOTE — Telephone Encounter (Signed)
Report to you.

## 2018-01-03 NOTE — Telephone Encounter (Signed)
I spoke with wife Steward DroneBrenda, she will see if patient wants to come in this Friday for review MRI appt. She will let me know.

## 2018-01-06 ENCOUNTER — Encounter (INDEPENDENT_AMBULATORY_CARE_PROVIDER_SITE_OTHER): Payer: Self-pay | Admitting: Orthopaedic Surgery

## 2018-01-06 ENCOUNTER — Ambulatory Visit (INDEPENDENT_AMBULATORY_CARE_PROVIDER_SITE_OTHER): Payer: BLUE CROSS/BLUE SHIELD | Admitting: Orthopaedic Surgery

## 2018-01-06 VITALS — BP 148/79 | HR 85 | Ht 74.0 in | Wt 330.0 lb

## 2018-01-06 DIAGNOSIS — S73004S Unspecified dislocation of right hip, sequela: Secondary | ICD-10-CM | POA: Diagnosis not present

## 2018-01-06 DIAGNOSIS — M51369 Other intervertebral disc degeneration, lumbar region without mention of lumbar back pain or lower extremity pain: Secondary | ICD-10-CM

## 2018-01-06 DIAGNOSIS — M5136 Other intervertebral disc degeneration, lumbar region: Secondary | ICD-10-CM

## 2018-01-06 NOTE — Addendum Note (Signed)
Addended by: Rogers SeedsYEATTS, Gurfateh Mcclain M on: 01/06/2018 01:44 PM   Modules accepted: Orders

## 2018-01-06 NOTE — Progress Notes (Signed)
Office Visit Note   Patient: [name] [name]           Date of Birth: [birth year]           MRN: [id number] Visit Date: 01/06/2018              Requested by: Rolm Gala, MD [address] [place], [place] [address] PCP: Rolm Gala, MD   Assessment & Plan: Visit Diagnoses:  1. Dislocation of right hip, sequela   2. Other intervertebral disc degeneration, lumbar region            Plan: We reviewed lumbar MRI images I gave him a copy of the report wife is present.  He has mild disc bulge at L1 to but no significant compression.  He has some narrowing at L4-5 and L5-S1 on the right touching the nerve root but without significant compression.  He has had soreness in his right groin since he dislocated his hip in May.  We will set him up for some physical therapy for further right  hip strengthening possibly some ultrasound treatment.  We discussed possibly considering a single  Lumbar epidural if therapy of his hip does not improve his symptoms but this appears more likely tendons or muscles around the hip with persistent irritation after his hip dislocation.  He still working full 10-hour shifts 4 days a week.  Recheck 5weeks.  Follow-Up Instructions: Return in about 5 weeks (around 02/10/2018).   Orders:  No orders of the defined types were placed in this encounter.  No orders of the defined types were placed in this encounter.     Procedures: No procedures performed   Clinical Data: No additional findings.   Subjective: Chief Complaint  Patient presents with  . Lower Back - Follow-up    MRI L spine    HPI 57 year old male returns with continued right anterior groin pain.  Patient had right total hip arthroplasty 05/06/2017 and early anterior postop hip dislocation on 05/29/2017 getting out of her recliner.  Hip was reduced in the emergency room.  He had some episodes of popping in the right anterior groin but this has not occurred recently.  He is back at work does a  lot of walking and has increased problems with standing.  He denies numbness in the groin or anterior thigh associated with his pain.  He has back pain thoracolumbar junction with prolonged standing and some pain that radiates into his buttocks and into his right groin with prolonged standing.  MRI scan lumbar was obtained and is available for review.  Patient had circumferential disc bulge at L1 to with bilateral neuroforaminal narrowing mild central stenosis.  Facet effusions noted at multiple lumbar levels.  Mild narrowing at L2-3 without compression.  L3-4 showed annular protrusion on the left opposite from his symptoms.  L4-5 showed broad-based disc bulge with borderline central narrowing and subarticular lateral recess narrowing on the right.  L5-S1 showed no disc bulge.  Far lateral disc protrusion on the right at L5.  Review of Systems updated unchanged since his surgery in April 2019 other than as mentioned in HPI.  Bilateral triple arthrodesis bilateral total hip arthroplasties.  X-rays show identical leg lengths.   Objective: Vital Signs: BP (!) 148/79   Pulse 85   Ht 6\' 2"  (1.88 m)   Wt (!) 330 lb (149.7 kg)   BMI 42.37 kg/m   Physical Exam Constitutional:      Appearance: He is well-developed.  HENT:  Head: Normocephalic and atraumatic.  Eyes:     Pupils: Pupils are equal, round, and reactive to light.  Neck:     Thyroid: No thyromegaly.     Trachea: No tracheal deviation.  Cardiovascular:     Rate and Rhythm: Normal rate.  Pulmonary:     Effort: Pulmonary effort is normal.     Breath sounds: No wheezing.  Abdominal:     General: Bowel sounds are normal.     Palpations: Abdomen is soft.  Skin:    General: Skin is warm and dry.     Capillary Refill: Capillary refill takes less than 2 seconds.  Neurological:     Mental Status: He is alert and oriented to person, place, and time.  Psychiatric:        Behavior: Behavior normal.        Thought Content: Thought content  normal.        Judgment: Judgment normal.     Ortho Exam patient ambulates up and down the hall with trace Trendelenburg gait on the right.  He has good hip flexion strength against hand resistance abductor and adductor is strong.  No sensory deficit over his thigh lower extremities.  Negative straight leg raising 90 degrees right and left. Specialty Comments:  No specialty comments available.  Imaging: No results found.   PMFS History: Patient Active Problem List   Diagnosis Date Noted  . Morbid (severe) obesity due to excess calories (HCC) 12/20/2017  . Hip dislocation, right (HCC) 06/15/2017  . Arthritis of right hip 05/06/2017  . History of total right hip replacement 02/07/2017  . Arthrodesis status 01/10/2015  . Postoperative fever 05/31/2014  . Acute respiratory failure with hypoxia (HCC) 05/31/2014  . Status post arthrodesis 05/29/2014  . Unilateral primary osteoarthritis, right hip 03/02/2013   Past Medical History:  Diagnosis Date  . Diabetes mellitus without complication (HCC)    type 2   . Heart murmur    Hx: of as achild  . Hypertension   . Inguinal hernia    Hx: of left groin  . Kidney stones    Hx: of  . OA (osteoarthritis) of hip    Hx: of  . Sleep apnea    Uses CPAP    Family History  Problem Relation Age of Onset  . Diabetes Mother   . Hypertension Mother   . Cancer - Other Mother   . Cancer - Lung Father   . Arthritis Sister   . Heart disease Sister     Past Surgical History:  Procedure Laterality Date  . COLONOSCOPY     Hx: of  . FOOT ARTHRODESIS Left 05/29/2014   Procedure: Left Triple Arthrodesis, Possible Achilles Lengthening;  Surgeon: Eldred Manges, MD;  Location: [facility];  Service: Orthopedics;  Laterality: Left;  . FOOT ARTHRODESIS Right 01/10/2015   Procedure: Right Triple Arthrodesis, Achilles Lengthening;  Surgeon: Eldred Manges, MD;  Location: [facility] OR;  Service: Orthopedics;  Laterality: Right;  . FOOT ARTHRODESIS, TRIPLE Left  05/29/2014  . HARDWARE REMOVAL Left 01/10/2015   Procedure: Left Foot Screw Removal ;  Surgeon: Eldred Manges, MD;  Location: [facility] OR;  Service: Orthopedics;  Laterality: Left;  . TOTAL HIP ARTHROPLASTY Left 03/02/2013   DR [name]  . TOTAL HIP ARTHROPLASTY Left 03/02/2013   Procedure: TOTAL HIP ARTHROPLASTY ANTERIOR APPROACH;  Surgeon: Eldred Manges, MD;  Location: [facility] OR;  Service: Orthopedics;  Laterality: Left;  Left Total Hip Arthroplasty Direct Anterior Approach  .  TOTAL HIP ARTHROPLASTY Right 05/06/2017   Procedure: RIGHT TOTAL HIP ARTHROPLASTY DIRECT ANTERIOR;  Surgeon: Eldred MangesYates, Chantil Bari C, MD;  Location: [facility] OR;  Service: Orthopedics;  Laterality: Right;   Social History   Occupational History  . Not on file  Tobacco Use  . Smoking status: Former Smoker    Types: Cigars, Cigarettes  . Smokeless tobacco: Current User    Types: Snuff  . Tobacco comment: Quit smoking 1998  Substance and Sexual Activity  . Alcohol use: Yes    Alcohol/week: 2.0 standard drinks    Types: 2 Shots of liquor per week    Comment: once a week   . Drug use: No  . Sexual activity: Not on file

## 2018-01-13 ENCOUNTER — Ambulatory Visit (INDEPENDENT_AMBULATORY_CARE_PROVIDER_SITE_OTHER): Payer: BLUE CROSS/BLUE SHIELD | Admitting: Orthopaedic Surgery

## 2018-01-16 ENCOUNTER — Encounter: Payer: Self-pay | Admitting: Physical Therapy

## 2018-01-16 ENCOUNTER — Ambulatory Visit (INDEPENDENT_AMBULATORY_CARE_PROVIDER_SITE_OTHER): Payer: BLUE CROSS/BLUE SHIELD | Admitting: Physical Therapy

## 2018-01-16 ENCOUNTER — Other Ambulatory Visit: Payer: Self-pay

## 2018-01-16 DIAGNOSIS — R262 Difficulty in walking, not elsewhere classified: Secondary | ICD-10-CM | POA: Diagnosis not present

## 2018-01-16 DIAGNOSIS — M6281 Muscle weakness (generalized): Secondary | ICD-10-CM

## 2018-01-16 NOTE — Patient Instructions (Signed)
Access Code: [email or web address]  URL: [email or web address]  Date: 01/16/2018  Prepared by: Moshe CiproStephanie Keanu Frickey   Exercises  Sit to Stand - 10 reps - 1 sets - 2x daily - 7x weekly  Sidelying Hip Abduction - 1 sets - 10 reps - 2x daily - 7x weekly  Standing Hip Abduction with Counter Support - 20 reps - 1 sets - 1x daily - 7x weekly  Standing Hip Hiking - 10 reps - 1 sets - 3-5 sec hold - 1x daily - 7x weekly

## 2018-01-16 NOTE — Therapy (Signed)
[facility] [address] [place], [place], [address] Phone: [phone]   Fax:  [phone]  Physical Therapy Evaluation  Patient Details  Name: [name] [name] MRN: [id number] Date of Birth: 02-Oct-1960 Referring Provider (PT): Annell GreeningMark Yates, MD   Encounter Date: 01/16/2018  PT End of Session - 01/16/18 1031    Visit Number  1    Number of Visits  6    Date for PT Re-Evaluation  02/27/18    Authorization Type  BCBS    PT Start Time  0801    PT Stop Time  0843    PT Time Calculation (min)  42 min    Activity Tolerance  Patient tolerated treatment well    Behavior During Therapy  [facility] for tasks assessed/performed       Past Medical History:  Diagnosis Date  . Diabetes mellitus without complication (HCC)    type 2   . Heart murmur    Hx: of as achild  . Hypertension   . Inguinal hernia    Hx: of left groin  . Kidney stones    Hx: of  . OA (osteoarthritis) of hip    Hx: of  . Sleep apnea    Uses CPAP    Past Surgical History:  Procedure Laterality Date  . COLONOSCOPY     Hx: of  . FOOT ARTHRODESIS Left 05/29/2014   Procedure: Left Triple Arthrodesis, Possible Achilles Lengthening;  Surgeon: Eldred MangesMark C Yates, MD;  Location: [facility] OR;  Service: Orthopedics;  Laterality: Left;  . FOOT ARTHRODESIS Right 01/10/2015   Procedure: Right Triple Arthrodesis, Achilles Lengthening;  Surgeon: Eldred MangesMark C Yates, MD;  [facility];  Service: Orthopedics;  Laterality: Right;  . FOOT ARTHRODESIS, TRIPLE Left 05/29/2014  . HARDWARE REMOVAL Left 01/10/2015   Procedure: Left Foot Screw Removal ;  Surgeon: Eldred MangesMark C Yates, MD;  [facility];  Service: Orthopedics;  Laterality: Left;  . TOTAL HIP ARTHROPLASTY Left 03/02/2013   DR [name] [name]  . TOTAL HIP ARTHROPLASTY Left 03/02/2013   Procedure: TOTAL HIP ARTHROPLASTY ANTERIOR APPROACH;  Surgeon: Eldred MangesMark C Yates, MD;  Location: [facility] OR;  Service: Orthopedics;  Laterality: Left;  Left Total Hip  Arthroplasty Direct Anterior Approach  . TOTAL HIP ARTHROPLASTY Right 05/06/2017   Procedure: RIGHT TOTAL HIP ARTHROPLASTY DIRECT ANTERIOR;  Surgeon: Eldred MangesYates, Mark C, MD;  Location: [facility] OR;  Service: Orthopedics;  Laterality: Right;    There were no vitals filed for this visit.   Subjective Assessment - 01/16/18 0804    Subjective  Pt is a 57 y/o male who presents to OPPT s/p Rt THA on 05/06/17; with subsequent dislocation on 05/29/17.  Pt reports continued difficulty with recovery, especially weakness.  Expresses most challenging difficulty with sit to/from stand.    Limitations  Walking;Standing    How long can you stand comfortably?  c/o spasms in back with standing still after ~15-20 min    How long can you walk comfortably?  due to low back 5 min without UE support    Diagnostic tests  MRI lumbar spine: essentially negative    Patient Stated Goals  reduce risk of dislocation; improve strengthening    Currently in Pain?  Yes    Pain Score  0-No pain   up to 4/10   Pain Location  Hip    Pain Orientation  Right    Pain Descriptors / Indicators  Sharp    Pain Type  Surgical pain;Chronic pain  Pain Onset  More than a month ago    Pain Frequency  Intermittent    Aggravating Factors   getting up off floor    Pain Relieving Factors  short lived; avoid provoking positions         [facility] - 01/16/18 0812      Assessment   Medical Diagnosis  Right THA Direct Anterior 05/06/17 with post op hip dislocation 05/29/17    Referring Provider (PT)  Annell GreeningMark Yates, MD    Onset Date/Surgical Date  05/06/17    Next MD Visit  02/10/2018    Prior Therapy  none      Precautions   Precautions  None      Restrictions   Weight Bearing Restrictions  No      Balance Screen   Has the patient fallen in the past 6 months  No    Has the patient had a decrease in activity level because of a fear of falling?   Yes    Is the patient reluctant to leave their home because of a fear of falling?   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; standing all day, no heavy lifting    Leisure  Water engineerhunting      Cognition   Overall Cognitive Status  Within Functional Limits for tasks assessed      Observation/Other Assessments   Focus on Therapeutic Outcomes (FOTO)   40 (60% limited; predicted 47% limited)      ROM / Strength   AROM / PROM / Strength  AROM;Strength      AROM   Overall AROM   Within functional limits for tasks performed      Strength   Overall Strength Comments  suspect hip extension weakness due to inability to stand without UE support    Strength Assessment Site  Hip;Knee;Ankle    Right/Left Hip  Right    Right Hip Flexion  4/5    Right Hip ABduction  3/5   substitution noted   Right/Left Knee  Right    Right Knee Extension  5/5    Right/Left Ankle  Right    Right Ankle Dorsiflexion  5/5      Transfers   Transfers  Sit to Stand;Stand to Sit    Sit to Stand  With upper extremity assist;From chair/3-in-1    Stand to Sit  Uncontrolled descent      Ambulation/Gait   Gait Pattern  Decreased stance time - right;Decreased step length - left;Trendelenburg                Objective measurements completed on examination: See above findings.      [facility] PT Treatment/Exercise - 01/16/18 0812      Exercises   Exercises  Knee/Hip      Knee/Hip Exercises: Standing   Hip Abduction  Both;5 reps    Abduction Limitations  for HEP instruction  Other Standing Knee Exercises  hip hike in Rt stance x 5 reps      Knee/Hip Exercises: Seated    Sit to Sand  5 reps;without UE support   elevated surface; cues for RLE weight bearing     Knee/Hip Exercises: Sidelying   Hip ABduction  Right;5 reps    Hip ABduction Limitations  cues for form and technique             PT Education - 01/16/18 1031    Education Details  HEP    Person(s) Educated  Patient    Methods  Explanation;Demonstration;Handout    Comprehension  Verbalized understanding;Returned demonstration;Need further instruction          PT Long Term Goals - 01/16/18 1035      PT LONG TERM GOAL #1   Title  independent with HEP    Status  New    Target Date  02/27/18      PT LONG TERM GOAL #2   Title  demonstrate improved functional strength by performing sit to/from stand in standard height chair without UE support    Status  New    Target Date  02/27/18      PT LONG TERM GOAL #3   Title  negotiate stairs reciprocally for improved functional strength    Status  New    Target Date  02/27/18      PT LONG TERM GOAL #4   Title  FOTO score improved to </= 47% limited for improved function    Status  New    Target Date  02/27/18             Plan - 01/16/18 1032    Clinical Impression Statement  Pt is a 57 y/o male who presents to OPPT s/p Rt anterior THA with subsequent dislocation on 05/29/17.  Pt presents with gait abnormalities, decreased strength affecting functional mobility.  Pt will benefit from PT to maximize function and address deficits listed.    History and Personal Factors relevant to plan of care:  HTN, DM, THA with dislocation    Clinical Presentation  Evolving    Clinical Presentation due to:  weakness following dislocation, ?possible nerve injury    Clinical Decision Making  Moderate    Rehab Potential  Good    PT Frequency  2x / week   recommend 2x/wk; pt to see 1x/wk due to work schedule   PT Duration  6 weeks    PT Treatment/Interventions  ADLs/Self Care Home Management;Cryotherapy;Ultrasound;Moist Heat;Electrical  Stimulation;Gait training;Stair training;Functional mobility training;Balance training;Neuromuscular re-education;Therapeutic exercise;Therapeutic activities;Patient/family education;Manual techniques;Dry needling;Passive range of motion;Taping    PT Next Visit Plan  review HEP, progress strengthening exercises, balance activities    PT Home Exercise Plan  Access Code: [email or web address]    Consulted and Agree with Plan of Care  Patient       Patient will benefit from skilled therapeutic intervention in order to improve the following deficits and impairments:  Abnormal gait, Pain, Decreased mobility, Decreased strength, Difficulty walking, Decreased balance  Visit Diagnosis: Muscle weakness (generalized) - Plan: PT plan of care cert/re-cert  Difficulty in walking, not elsewhere classified - Plan: PT plan of care cert/re-cert     Problem List Patient Active Problem List   Diagnosis Date Noted  . Morbid (severe) obesity due to excess calories (HCC) 12/20/2017  . Hip dislocation, right (HCC) 06/15/2017  . Arthritis of right hip 05/06/2017  . History of total right hip replacement 02/07/2017  . Arthrodesis  status 01/10/2015  . Postoperative fever 05/31/2014  . Acute respiratory failure with hypoxia (HCC) 05/31/2014  . Status post arthrodesis 05/29/2014  . Unilateral primary osteoarthritis, right hip 03/02/2013      Clarita Crane, PT, DPT 01/16/18 10:39 AM     [facility] [address] [place], [place], [address] Phone: [phone]   Fax:  [phone]  Name: Kayne Yuhas MRN: [id number] Date of Birth: 22-Aug-1960

## 2018-01-27 ENCOUNTER — Encounter: Payer: Self-pay | Admitting: Physical Therapy

## 2018-01-27 ENCOUNTER — Ambulatory Visit (INDEPENDENT_AMBULATORY_CARE_PROVIDER_SITE_OTHER): Payer: BLUE CROSS/BLUE SHIELD | Admitting: Physical Therapy

## 2018-01-27 DIAGNOSIS — R262 Difficulty in walking, not elsewhere classified: Secondary | ICD-10-CM | POA: Diagnosis not present

## 2018-01-27 DIAGNOSIS — M6281 Muscle weakness (generalized): Secondary | ICD-10-CM | POA: Diagnosis not present

## 2018-01-27 NOTE — Patient Instructions (Signed)
Access Code: [email or web address]  URL: [email or web address]  Date: 01/27/2018  Prepared by: Moshe Cipro   Exercises  Sit to Stand - 10 reps - 1 sets - 2x daily - 7x weekly  Sidelying Hip Abduction - 1 sets - 10 reps - 2x daily - 7x weekly  Standing Hip Abduction with Counter Support - 20 reps - 1 sets - 1x daily - 7x weekly  Standing Hip Hiking - 10 reps - 1 sets - 3-5 sec hold - 1x daily - 7x weekly  Supine Bridge - 10 reps - 1 sets - 5 sec hold - 2x daily - 7x weekly  Standing Hip Extension with Counter Support - 20 reps - 1 sets - 1x daily - 7x weekly

## 2018-01-27 NOTE — Therapy (Signed)
[place], [place], [address] Phone: [phone]   Fax:  [phone]  Physical Therapy Treatment  Patient Details  Name: [name] [name] MRN: [id number] Date of Birth: [birth year] Referring Provider (PT): Rodell Perna, MD   Encounter Date: 01/27/2018  PT End of Session - 01/27/18 1026    Visit Number  2    Number of Visits  6    Date for PT Re-Evaluation  02/27/18    Authorization Type  BCBS    PT Start Time  [phone]    PT Stop Time  1010    PT Time Calculation (min)  39 min    Activity Tolerance  Patient tolerated treatment well    Behavior During Therapy  [facility] for tasks assessed/performed       Past Medical History:  Diagnosis Date  . Diabetes mellitus without complication ([place])    type 2   . Heart murmur    Hx: of as achild  . Hypertension   . Inguinal hernia    Hx: of left groin  . Kidney stones    Hx: of  . OA (osteoarthritis) of hip    Hx: of  . Sleep apnea    Uses CPAP    Past Surgical History:  Procedure Laterality Date  . COLONOSCOPY     Hx: of  . FOOT ARTHRODESIS Left 05/29/2014   Procedure: Left Triple Arthrodesis, Possible Achilles Lengthening;  Surgeon: Marybelle Killings, MD;  Location: [place];  Service: Orthopedics;  Laterality: Left;  . FOOT ARTHRODESIS Right 01/10/2015   Procedure: Right Triple Arthrodesis, Achilles Lengthening;  Surgeon: Marybelle Killings, MD;  Location: [place];  Service: Orthopedics;  Laterality: Right;  . FOOT ARTHRODESIS, TRIPLE Left 05/29/2014  . HARDWARE REMOVAL Left 01/10/2015   Procedure: Left Foot Screw Removal ;  Surgeon: Marybelle Killings, MD;  Location: [place];  Service: Orthopedics;  Laterality: Left;  . TOTAL HIP ARTHROPLASTY Left 03/02/2013   DR Lorin Mercy  . TOTAL HIP ARTHROPLASTY Left 03/02/2013   Procedure: TOTAL HIP ARTHROPLASTY ANTERIOR APPROACH;  Surgeon: Marybelle Killings, MD;  Location: [place];  Service: Orthopedics;  Laterality: Left;  Left Total Hip  Arthroplasty Direct Anterior Approach  . TOTAL HIP ARTHROPLASTY Right 05/06/2017   Procedure: RIGHT TOTAL HIP ARTHROPLASTY DIRECT ANTERIOR;  Surgeon: Marybelle Killings, MD;  Location: [place];  Service: Orthopedics;  Laterality: Right;    There were no vitals filed for this visit.  Subjective Assessment - 01/27/18 0933    Subjective  feeling better since last visit.  able to stand without significant tenderness and feels sit to/from stand has improved.    Patient Stated Goals  reduce risk of dislocation; improve strengthening    Pain Score  0-No pain                       [facility] Adult PT Treatment/Exercise - 01/27/18 0934      Knee/Hip Exercises: Stretches   Passive Hamstring Stretch  Right;3 reps;30 seconds    Passive Hamstring Stretch Limitations  supine with strap      Knee/Hip Exercises: Aerobic   Nustep  L4 x 6 min      Knee/Hip Exercises: Standing   Hip Abduction  Both;10 reps;Knee straight    Hip Extension  Both;10 reps;Knee straight    SLS  5x10 sec bil with 1 UE support; tandem stance 5x10 sec bil    Other Standing Knee Exercises  lateral step up into hip hike; RLE in stance x 10 rep      Knee/Hip Exercises: Seated   Long Arc Quad  Left;10 reps;Weights    Long Arc Quad Weight  --   2.5   Marching  Right;10 reps;Weights    Marching Weights  --   2.5   Sit to [employer]  10 reps;without UE support      Knee/Hip Exercises: Supine   Bridges  Both;10 reps      Knee/Hip Exercises: Sidelying   Hip ABduction  Right;10 reps    Clams  Right; 10 reps             PT Education - 01/27/18 1026    Education Details  updated HEP    Person(s) Educated  Patient    Methods  Explanation;Demonstration;Handout    Comprehension  Verbalized understanding;Need further instruction          PT Long Term Goals - 01/16/18 1035      PT LONG TERM GOAL #1   Title  independent with HEP    Status  New    Target Date  02/27/18      PT LONG TERM GOAL #2   Title   demonstrate improved functional strength by performing sit to/from stand in standard height chair without UE support    Status  New    Target Date  02/27/18      PT LONG TERM GOAL #3   Title  negotiate stairs reciprocally for improved functional strength    Status  New    Target Date  02/27/18      PT LONG TERM GOAL #4   Title  FOTO score improved to </= 47% limited for improved function    Status  New    Target Date  02/27/18            Plan - 01/27/18 1026    Clinical Impression Statement  Pt reports improvement in symptoms since last visit and is compliant with HEP.  Pt needed min cues for proper technique with HEP but overall progressing well.  Improved sit to/from stand today without UE support.  No goals met at this time, only 2nd visit.    Rehab Potential  Good    PT Frequency  2x / week   recommend 2x/wk; pt to see 1x/wk due to work schedule   PT Duration  6 weeks    PT Treatment/Interventions  ADLs/Self Care Home Management;Cryotherapy;Ultrasound;Moist Heat;Electrical Stimulation;Gait training;Stair training;Functional mobility training;Balance training;Neuromuscular re-education;Therapeutic exercise;Therapeutic activities;Patient/family education;Manual techniques;Dry needling;Passive range of motion;Taping    PT Next Visit Plan  review HEP, progress strengthening exercises, balance activities (vestibular input exercises)    PT Home Exercise Plan  Access Code: [email or web address]    Consulted and Agree with Plan of Care  Patient       Patient will benefit from skilled therapeutic intervention in order to improve the following deficits and impairments:  Abnormal gait, Pain, Decreased mobility, Decreased strength, Difficulty walking, Decreased balance  Visit Diagnosis: Muscle weakness (generalized)  Difficulty in walking, not elsewhere classified     Problem List Patient Active Problem List   Diagnosis Date Noted  . Morbid (severe) obesity due to excess calories ([place])  12/20/2017  . Hip dislocation, right ([place]) 06/15/2017  . Arthritis of right hip 05/06/2017  . History of total right hip replacement 02/07/2017  . Arthrodesis status 01/10/2015  . Postoperative fever 05/31/2014  . Acute respiratory failure with hypoxia ([place]) 05/31/2014  . Status  post arthrodesis 05/29/2014  . Unilateral primary osteoarthritis, right hip 03/02/2013      Laureen Abrahams, PT, DPT 01/27/18 10:28 AM     [facility] [place], [place], [address] Phone: [phone]   Fax:  [phone]  Name: [name] [name] MRN: [id number] Date of Birth: 02-18-1960

## 2018-02-02 ENCOUNTER — Ambulatory Visit: Payer: BLUE CROSS/BLUE SHIELD | Admitting: Physical Therapy

## 2018-02-02 ENCOUNTER — Encounter: Payer: Self-pay | Admitting: Physical Therapy

## 2018-02-02 DIAGNOSIS — R262 Difficulty in walking, not elsewhere classified: Secondary | ICD-10-CM | POA: Diagnosis not present

## 2018-02-02 DIAGNOSIS — M6281 Muscle weakness (generalized): Secondary | ICD-10-CM

## 2018-02-02 NOTE — Therapy (Signed)
[name] [place] [address] [place], [place], [address] Phone: [phone]   Fax:  [phone]  Physical Therapy Treatment  Patient Details  Name: [name] [name] MRN: [id number] Date of Birth: 09-24-1960 Referring Provider (PT): Annell Greening, MD   Encounter Date: 02/02/2018  PT End of Session - 02/02/18 1649    Visit Number  3    Number of Visits  6    Date for PT Re-Evaluation  02/27/18    Authorization Type  BCBS    PT Start Time  1610    PT Stop Time  1649    PT Time Calculation (min)  39 min    Activity Tolerance  Patient tolerated treatment well    Behavior During Therapy  [facility] for tasks assessed/performed       Past Medical History:  Diagnosis Date  . Diabetes mellitus without complication (HCC)    type 2   . Heart murmur    Hx: of as achild  . Hypertension   . Inguinal hernia    Hx: of left groin  . Kidney stones    Hx: of  . OA (osteoarthritis) of hip    Hx: of  . Sleep apnea    Uses CPAP    Past Surgical History:  Procedure Laterality Date  . COLONOSCOPY     Hx: of  . FOOT ARTHRODESIS Left 05/29/2014   Procedure: Left Triple Arthrodesis, Possible Achilles Lengthening;  Surgeon: Eldred Manges, MD;  [facility];  Service: Orthopedics;  Laterality: Left;  . FOOT ARTHRODESIS Right 01/10/2015   Procedure: Right Triple Arthrodesis, Achilles Lengthening;  Surgeon: Eldred Manges, MD;  Location: [facility] OR;  Service: Orthopedics;  Laterality: Right;  . FOOT ARTHRODESIS, TRIPLE Left 05/29/2014  . HARDWARE REMOVAL Left 01/10/2015   Procedure: Left Foot Screw Removal ;  Surgeon: Eldred Manges, MD;  Location: [facility] OR;  Service: Orthopedics;  Laterality: Left;  . TOTAL HIP ARTHROPLASTY Left 03/02/2013   DR [name]  . TOTAL HIP ARTHROPLASTY Left 03/02/2013   Procedure: TOTAL HIP ARTHROPLASTY ANTERIOR APPROACH;  Surgeon: Eldred Manges, MD;  [facility] OR;  Service: Orthopedics;  Laterality: Left;  Left Total Hip  Arthroplasty Direct Anterior Approach  . TOTAL HIP ARTHROPLASTY Right 05/06/2017   Procedure: RIGHT TOTAL HIP ARTHROPLASTY DIRECT ANTERIOR;  Surgeon: Eldred Manges, MD;  [facility] OR;  Service: Orthopedics;  Laterality: Right;    There were no vitals filed for this visit.  Subjective Assessment - 02/02/18 1609    Subjective  went back to work Monday, standing 30-45 min followed by sitting for 15-20 min throughout 12 hour day.    Patient Stated Goals  reduce risk of dislocation; improve strengthening    Currently in Pain?  No/denies                       [facility] Adult PT Treatment/Exercise - 02/02/18 1612      Knee/Hip Exercises: Stretches   Passive Hamstring Stretch  Right;3 reps;30 seconds    Passive Hamstring Stretch Limitations  supine with strap    Quad Stretch  Right;3 reps;30 seconds    Quad Stretch Limitations  seated (hip in neutral)      Knee/Hip Exercises: Aerobic   Nustep  L6 x 6 min; LEs only      Knee/Hip Exercises: Standing   Heel Raises  Both;20 reps    Hip Abduction  Both;10 reps;Knee straight    Hip  Extension  Both;10 reps;Knee straight    Lateral Step Up  Both;10 reps;Hand Hold: 1;Step Height: 6"    Forward Step Up  Both;10 reps;Hand Hold: 2;Step Height: 6"    Functional Squat  2 sets;10 reps;3 seconds      Knee/Hip Exercises: Seated   Sit to Sand  10 reps;without UE support      Knee/Hip Exercises: Supine   Hip Adduction Isometric  Both;10 reps    Bridges  Both;10 reps    Other Supine Knee/Hip Exercises  single limb clamshell with blue theraband x 10 reps bil    Other Supine Knee/Hip Exercises  hooklying marching x10 reps bil with blue theraband                  PT Long Term Goals - 01/16/18 1035      PT LONG TERM GOAL #1   Title  independent with HEP    Status  New    Target Date  02/27/18      PT LONG TERM GOAL #2   Title  demonstrate improved functional strength by performing sit to/from stand in standard height chair  without UE support    Status  New    Target Date  02/27/18      PT LONG TERM GOAL #3   Title  negotiate stairs reciprocally for improved functional strength    Status  New    Target Date  02/27/18      PT LONG TERM GOAL #4   Title  FOTO score improved to </= 47% limited for improved function    Status  New    Target Date  02/27/18            Plan - 02/02/18 1650    Clinical Impression Statement  Pt tolerated session well today with c/o increased fatigue.  Overall progressing well with PT.    Rehab Potential  Good    PT Frequency  2x / week   recommend 2x/wk; pt to see 1x/wk due to work schedule   PT Duration  6 weeks    PT Treatment/Interventions  ADLs/Self Care Home Management;Cryotherapy;Ultrasound;Moist Heat;Electrical Stimulation;Gait training;Stair training;Functional mobility training;Balance training;Neuromuscular re-education;Therapeutic exercise;Therapeutic activities;Patient/family education;Manual techniques;Dry needling;Passive range of motion;Taping    PT Next Visit Plan  review HEP, progress strengthening exercises, balance activities (vestibular input exercises)    PT Home Exercise Plan  Access Code: [email or web address]    Consulted and Agree with Plan of Care  Patient       Patient will benefit from skilled therapeutic intervention in order to improve the following deficits and impairments:  Abnormal gait, Pain, Decreased mobility, Decreased strength, Difficulty walking, Decreased balance  Visit Diagnosis: Muscle weakness (generalized)  Difficulty in walking, not elsewhere classified     Problem List Patient Active Problem List   Diagnosis Date Noted  . Morbid (severe) obesity due to excess calories (HCC) 12/20/2017  . Hip dislocation, right (HCC) 06/15/2017  . Arthritis of right hip 05/06/2017  . History of total right hip replacement 02/07/2017  . Arthrodesis status 01/10/2015  . Postoperative fever 05/31/2014  . Acute respiratory failure with hypoxia (HCC)  05/31/2014  . Status post arthrodesis 05/29/2014  . Unilateral primary osteoarthritis, right hip 03/02/2013      Clarita CraneStephanie F Donel Osowski, PT, DPT 02/02/18 4:51 PM     [facility] [address] [place], [place], [address] Phone: [phone]   Fax:  [phone]  Name: Jerl MinaDaniel Wayne Bueno MRN: [id number] Date  of Birth: 08-14-1960

## 2018-02-09 ENCOUNTER — Ambulatory Visit: Payer: BLUE CROSS/BLUE SHIELD | Admitting: Physical Therapy

## 2018-02-09 ENCOUNTER — Encounter: Payer: Self-pay | Admitting: Physical Therapy

## 2018-02-09 DIAGNOSIS — R262 Difficulty in walking, not elsewhere classified: Secondary | ICD-10-CM | POA: Diagnosis not present

## 2018-02-09 DIAGNOSIS — M6281 Muscle weakness (generalized): Secondary | ICD-10-CM

## 2018-02-09 NOTE — Therapy (Signed)
[place], [place], [address] Phone: [phone]   Fax:  [phone]  Physical Therapy Treatment/Discharge  Patient Details  Name: [name] [name] MRN: [id number] Date of Birth: 22-Nov-1960 Referring Provider (PT): Rodell Perna, MD   Encounter Date: 02/09/2018  PT End of Session - 02/09/18 1639    Visit Number  4    Number of Visits  6    Date for PT Re-Evaluation  02/27/18    Authorization Type  BCBS    PT Start Time  1605    PT Stop Time  1635    PT Time Calculation (min)  30 min    Activity Tolerance  Patient tolerated treatment well    Behavior During Therapy  [facility] for tasks assessed/performed       Past Medical History:  Diagnosis Date  . Diabetes mellitus without complication ([place])    type 2   . Heart murmur    Hx: of as achild  . Hypertension   . Inguinal hernia    Hx: of left groin  . Kidney stones    Hx: of  . OA (osteoarthritis) of hip    Hx: of  . Sleep apnea    Uses CPAP    Past Surgical History:  Procedure Laterality Date  . COLONOSCOPY     Hx: of  . FOOT ARTHRODESIS Left 05/29/2014   Procedure: Left Triple Arthrodesis, Possible Achilles Lengthening;  Surgeon: Marybelle Killings, MD;  Location: [facility];  Service: Orthopedics;  Laterality: Left;  . FOOT ARTHRODESIS Right 01/10/2015   Procedure: Right Triple Arthrodesis, Achilles Lengthening;  Surgeon: Marybelle Killings, MD;  Location: [place];  Service: Orthopedics;  Laterality: Right;  . FOOT ARTHRODESIS, TRIPLE Left 05/29/2014  . HARDWARE REMOVAL Left 01/10/2015   Procedure: Left Foot Screw Removal ;  Surgeon: Marybelle Killings, MD;  Location: [facility];  Service: Orthopedics;  Laterality: Left;  . TOTAL HIP ARTHROPLASTY Left 03/02/2013   DR Lorin Mercy  . TOTAL HIP ARTHROPLASTY Left 03/02/2013   Procedure: TOTAL HIP ARTHROPLASTY ANTERIOR APPROACH;  Surgeon: Marybelle Killings, MD;  Location: [facility];  Service: Orthopedics;  Laterality: Left;  Left Total Hip  Arthroplasty Direct Anterior Approach  . TOTAL HIP ARTHROPLASTY Right 05/06/2017   Procedure: RIGHT TOTAL HIP ARTHROPLASTY DIRECT ANTERIOR;  Surgeon: Marybelle Killings, MD;  Location: [facility];  Service: Orthopedics;  Laterality: Right;    There were no vitals filed for this visit.  Subjective Assessment - 02/09/18 1608    Subjective  feels the hip is doing better; feels less tension in low back and feels he can walk longer periods of time.    How long can you stand comfortably?  c/o spasms in back with standing still after ~15-20 min    How long can you walk comfortably?  up to 1 hour    Patient Stated Goals  reduce risk of dislocation; improve strengthening    Currently in Pain?  No/denies         [facility] PT Assessment - 02/09/18 1616      Assessment   Medical Diagnosis  Right THA Direct Anterior 05/06/17 with post op hip dislocation 05/29/17    Referring Provider (PT)  Rodell Perna, MD    Onset Date/Surgical Date  05/06/17    Next MD Visit  02/10/2018      Observation/Other Assessments   Focus on Therapeutic Outcomes (FOTO)   71 (29% limited)  [facility] Adult PT Treatment/Exercise - 02/09/18 1609      Ambulation/Gait   Stairs  Yes    Stairs Assistance  5: Supervision    Stair Management Technique  One rail Left;Alternating pattern    Number of Stairs  12    Height of Stairs  6    Gait Comments  recommended step to for safety       Self-Care   Self-Care  Other Self-Care Comments    Other Self-Care Comments   educated on HEP and recommended progression as well as increasing walking for aerobic activity      Knee/Hip Exercises: Stretches   Passive Hamstring Stretch  Right;Left;3 reps;30 seconds    Passive Hamstring Stretch Limitations  seated      Knee/Hip Exercises: Aerobic   Nustep  L6 x 6 min; LEs only      Knee/Hip Exercises: Standing   Hip Abduction  Both;Knee straight;15 reps    Abduction Limitations  green theraband    Hip Extension  Both;15  reps;Knee straight    Extension Limitations  green theraband      Knee/Hip Exercises: Seated   Sit to Sand  10 reps;without UE support                  PT Long Term Goals - 02/09/18 1640      PT LONG TERM GOAL #1   Title  independent with HEP    Status  Achieved      PT LONG TERM GOAL #2   Title  demonstrate improved functional strength by performing sit to/from stand in standard height chair without UE support    Status  Achieved      PT LONG TERM GOAL #3   Title  negotiate stairs reciprocally for improved functional strength    Baseline  able to perform with 1 handrail with some unsteadiness; recommend step to for safety    Status  Partially Met      PT LONG TERM GOAL #4   Title  FOTO score improved to </= 47% limited for improved function    Status  Achieved            Plan - 02/09/18 1640    Clinical Impression Statement  Pt has met all goals and is ready for d/c.  Recommend continued exercise and walking for aerobic and functional strength.    Rehab Potential  Good    PT Frequency  2x / week   recommend 2x/wk; pt to see 1x/wk due to work schedule   PT Duration  6 weeks    PT Treatment/Interventions  ADLs/Self Care Home Management;Cryotherapy;Ultrasound;Moist Heat;Electrical Stimulation;Gait training;Stair training;Functional mobility training;Balance training;Neuromuscular re-education;Therapeutic exercise;Therapeutic activities;Patient/family education;Manual techniques;Dry needling;Passive range of motion;Taping    PT Next Visit Plan  d/c PT today    PT Home Exercise Plan  Access Code: [email or web address]    Consulted and Agree with Plan of Care  Patient       Patient will benefit from skilled therapeutic intervention in order to improve the following deficits and impairments:  Abnormal gait, Pain, Decreased mobility, Decreased strength, Difficulty walking, Decreased balance  Visit Diagnosis: Muscle weakness (generalized)  Difficulty in walking, not  elsewhere classified     Problem List Patient Active Problem List   Diagnosis Date Noted  . Morbid (severe) obesity due to excess calories ([place]) 12/20/2017  . Hip dislocation, right ([place]) 06/15/2017  . Arthritis of right hip 05/06/2017  . History of total right hip replacement 02/07/2017  .  Arthrodesis status 01/10/2015  . Postoperative fever 05/31/2014  . Acute respiratory failure with hypoxia ([place]) 05/31/2014  . Status post arthrodesis 05/29/2014  . Unilateral primary osteoarthritis, right hip 03/02/2013      Laureen Abrahams, PT, DPT 02/09/18 4:41 PM    [facility] [place], [place], [address] Phone: [phone]   Fax:  [phone]  Name: [name] [name] MRN: [id number] Date of Birth: 02/23/1960     PHYSICAL THERAPY DISCHARGE SUMMARY  Visits from Start of Care: 4  Current functional level related to goals / functional outcomes: See above   Remaining deficits: See above   Education / Equipment: HEP  Plan: Patient agrees to discharge.  Patient goals were met. Patient is being discharged due to meeting the stated rehab goals.  ?????     Laureen Abrahams, PT, DPT 02/09/18 4:42 PM  [facility] [place], [place] [address]  [phone] (office) [phone] (fax)

## 2018-02-10 ENCOUNTER — Encounter (INDEPENDENT_AMBULATORY_CARE_PROVIDER_SITE_OTHER): Payer: Self-pay | Admitting: Orthopaedic Surgery

## 2018-02-10 ENCOUNTER — Ambulatory Visit (INDEPENDENT_AMBULATORY_CARE_PROVIDER_SITE_OTHER): Payer: BLUE CROSS/BLUE SHIELD | Admitting: Orthopaedic Surgery

## 2018-02-10 VITALS — BP 148/78 | HR 106 | Ht 74.0 in | Wt 350.0 lb

## 2018-02-10 DIAGNOSIS — S73004S Unspecified dislocation of right hip, sequela: Secondary | ICD-10-CM

## 2018-02-10 NOTE — Progress Notes (Signed)
Office Visit Note   Patient: [name] [name]           Date of Birth: [birth year]           MRN: [id number] Visit Date: 02/10/2018              Requested by: Rolm Gala, MD [address] [place], [place] [address] PCP: Rolm Gala, MD   Assessment & Plan: Visit Diagnoses: [place] from dislocation right hip with gluteus maximus weakness.  Plan: Patient is gotten good relief with gluteus maximus strengthening.  He will continue his home exercise program office follow-up PRN.  Follow-Up Instructions: Return if symptoms worsen or fail to improve.   Orders:  No orders of the defined types were placed in this encounter.  No orders of the defined types were placed in this encounter.     Procedures: No procedures performed   Clinical Data: No additional findings.   Subjective: Chief Complaint  Patient presents with  . Right Hip - Follow-up    05/06/17 Right Total Hip Arthroplasty    HPI patient returns 5weeks post follow-up after being referred for physical therapy after dislocation of his hip in May with persistent weakness problems with prolonged standing.  He has been working with physical therapy and states she is got near complete resolution of his symptoms.  He is doing home physical therapy and states is not having any pain anymore when he gets up.  Review of Systems updated unchanged from last office visit.  Right total hip arthroplasty 05/06/2017 and hip dislocation 05/29/2017 at home getting out of the recliner.   Objective: Vital Signs: BP (!) 148/78   Pulse (!) 106   Ht 6\' 2"  (1.88 m)   Wt (!) 350 lb (158.8 kg)   BMI 44.94 kg/m   Physical Exam Constitutional:      Appearance: He is well-developed.  HENT:     Head: Normocephalic and atraumatic.  Eyes:     Pupils: Pupils are equal, round, and reactive to light.  Neck:     Thyroid: No thyromegaly.     Trachea: No tracheal deviation.  Cardiovascular:     Rate and Rhythm: Normal rate.    Pulmonary:     Effort: Pulmonary effort is normal.     Breath sounds: No wheezing.  Abdominal:     General: Bowel sounds are normal.     Palpations: Abdomen is soft.  Skin:    General: Skin is warm and dry.     Capillary Refill: Capillary refill takes less than 2 seconds.  Neurological:     Mental Status: He is alert and oriented to person, place, and time.  Psychiatric:        Behavior: Behavior normal.        Thought Content: Thought content normal.        Judgment: Judgment normal.     Ortho Exam patient is amatory with negative Trendelenburg gait he has fast gait sequence without limping no pain getting from sitting to standing.  It out of a chair without using his arms to push himself up.  Specialty Comments:  No specialty comments available.  Imaging: No results found.   PMFS History: Patient Active Problem List   Diagnosis Date Noted  . Morbid (severe) obesity due to excess calories (HCC) 12/20/2017  . Hip dislocation, right (HCC) 06/15/2017  . Arthritis of right hip 05/06/2017  . History of total right hip replacement 02/07/2017  . Arthrodesis status 01/10/2015  .  Postoperative fever 05/31/2014  . Acute respiratory failure with hypoxia (HCC) 05/31/2014  . Status post arthrodesis 05/29/2014  . Unilateral primary osteoarthritis, right hip 03/02/2013   Past Medical History:  Diagnosis Date  . Diabetes mellitus without complication (HCC)    type 2   . Heart murmur    Hx: of as achild  . Hypertension   . Inguinal hernia    Hx: of left groin  . Kidney stones    Hx: of  . OA (osteoarthritis) of hip    Hx: of  . Sleep apnea    Uses CPAP    Family History  Problem Relation Age of Onset  . Diabetes Mother   . Hypertension Mother   . Cancer - Other Mother   . Cancer - Lung Father   . Arthritis Sister   . Heart disease Sister     Past Surgical History:  Procedure Laterality Date  . COLONOSCOPY     Hx: of  . FOOT ARTHRODESIS Left 05/29/2014    Procedure: Left Triple Arthrodesis, Possible Achilles Lengthening;  Surgeon: Eldred Manges, MD;  Location: [facility] OR;  Service: Orthopedics;  Laterality: Left;  . FOOT ARTHRODESIS Right 01/10/2015   Procedure: Right Triple Arthrodesis, Achilles Lengthening;  Surgeon: Eldred Manges, MD;  Location: [facility] OR;  Service: Orthopedics;  Laterality: Right;  . FOOT ARTHRODESIS, TRIPLE Left 05/29/2014  . HARDWARE REMOVAL Left 01/10/2015   Procedure: Left Foot Screw Removal ;  Surgeon: Eldred Manges, MD;  Location: [facility] OR;  Service: Orthopedics;  Laterality: Left;  . TOTAL HIP ARTHROPLASTY Left 03/02/2013   DR [name]  . TOTAL HIP ARTHROPLASTY Left 03/02/2013   Procedure: TOTAL HIP ARTHROPLASTY ANTERIOR APPROACH;  Surgeon: Eldred Manges, MD;  [facility] OR;  Service: Orthopedics;  Laterality: Left;  Left Total Hip Arthroplasty Direct Anterior Approach  . TOTAL HIP ARTHROPLASTY Right 05/06/2017   Procedure: RIGHT TOTAL HIP ARTHROPLASTY DIRECT ANTERIOR;  Surgeon: Eldred Manges, MD;  [facility] OR;  Service: Orthopedics;  Laterality: Right;   Social History   Occupational History  . Not on file  Tobacco Use  . Smoking status: Former Smoker    Types: Cigars, Cigarettes  . Smokeless tobacco: Current User    Types: Snuff  . Tobacco comment: Quit smoking 1998  Substance and Sexual Activity  . Alcohol use: Yes    Alcohol/week: 2.0 standard drinks    Types: 2 Shots of liquor per week    Comment: once a week   . Drug use: No  . Sexual activity: Not on file

## 2018-03-10 DIAGNOSIS — E119 Type 2 diabetes mellitus without complications: Secondary | ICD-10-CM | POA: Diagnosis not present

## 2018-08-14 DIAGNOSIS — E7800 Pure hypercholesterolemia, unspecified: Secondary | ICD-10-CM | POA: Diagnosis not present

## 2018-08-14 DIAGNOSIS — E1165 Type 2 diabetes mellitus with hyperglycemia: Secondary | ICD-10-CM | POA: Diagnosis not present

## 2018-09-05 DIAGNOSIS — R3129 Other microscopic hematuria: Secondary | ICD-10-CM | POA: Diagnosis not present

## 2018-09-05 DIAGNOSIS — E1165 Type 2 diabetes mellitus with hyperglycemia: Secondary | ICD-10-CM | POA: Diagnosis not present

## 2018-09-05 DIAGNOSIS — I10 Essential (primary) hypertension: Secondary | ICD-10-CM | POA: Diagnosis not present

## 2018-09-05 DIAGNOSIS — E7800 Pure hypercholesterolemia, unspecified: Secondary | ICD-10-CM | POA: Diagnosis not present

## 2018-10-04 DIAGNOSIS — N189 Chronic kidney disease, unspecified: Secondary | ICD-10-CM | POA: Diagnosis not present

## 2019-01-01 DIAGNOSIS — E7800 Pure hypercholesterolemia, unspecified: Secondary | ICD-10-CM | POA: Diagnosis not present

## 2019-01-01 DIAGNOSIS — I10 Essential (primary) hypertension: Secondary | ICD-10-CM | POA: Diagnosis not present

## 2019-01-01 DIAGNOSIS — E1165 Type 2 diabetes mellitus with hyperglycemia: Secondary | ICD-10-CM | POA: Diagnosis not present

## 2019-01-01 DIAGNOSIS — R3129 Other microscopic hematuria: Secondary | ICD-10-CM | POA: Diagnosis not present

## 2019-05-08 DIAGNOSIS — E1165 Type 2 diabetes mellitus with hyperglycemia: Secondary | ICD-10-CM | POA: Diagnosis not present

## 2019-05-08 DIAGNOSIS — R3129 Other microscopic hematuria: Secondary | ICD-10-CM | POA: Diagnosis not present

## 2019-05-08 DIAGNOSIS — I10 Essential (primary) hypertension: Secondary | ICD-10-CM | POA: Diagnosis not present

## 2019-05-08 DIAGNOSIS — E7800 Pure hypercholesterolemia, unspecified: Secondary | ICD-10-CM | POA: Diagnosis not present

## 2019-08-16 DIAGNOSIS — M6208 Separation of muscle (nontraumatic), other site: Secondary | ICD-10-CM | POA: Diagnosis not present

## 2019-08-16 DIAGNOSIS — K429 Umbilical hernia without obstruction or gangrene: Secondary | ICD-10-CM | POA: Diagnosis not present

## 2019-09-06 DIAGNOSIS — E1165 Type 2 diabetes mellitus with hyperglycemia: Secondary | ICD-10-CM | POA: Diagnosis not present

## 2019-09-06 DIAGNOSIS — N189 Chronic kidney disease, unspecified: Secondary | ICD-10-CM | POA: Diagnosis not present

## 2019-09-06 DIAGNOSIS — I10 Essential (primary) hypertension: Secondary | ICD-10-CM | POA: Diagnosis not present

## 2019-09-06 DIAGNOSIS — R3129 Other microscopic hematuria: Secondary | ICD-10-CM | POA: Diagnosis not present

## 2019-12-31 DIAGNOSIS — E7800 Pure hypercholesterolemia, unspecified: Secondary | ICD-10-CM | POA: Diagnosis not present

## 2019-12-31 DIAGNOSIS — E1165 Type 2 diabetes mellitus with hyperglycemia: Secondary | ICD-10-CM | POA: Diagnosis not present

## 2020-01-07 DIAGNOSIS — E1165 Type 2 diabetes mellitus with hyperglycemia: Secondary | ICD-10-CM | POA: Diagnosis not present

## 2020-01-07 DIAGNOSIS — N189 Chronic kidney disease, unspecified: Secondary | ICD-10-CM | POA: Diagnosis not present

## 2020-01-07 DIAGNOSIS — I10 Essential (primary) hypertension: Secondary | ICD-10-CM | POA: Diagnosis not present

## 2020-01-07 DIAGNOSIS — R3129 Other microscopic hematuria: Secondary | ICD-10-CM | POA: Diagnosis not present

## 2020-05-09 DIAGNOSIS — M25512 Pain in left shoulder: Secondary | ICD-10-CM | POA: Diagnosis not present

## 2020-07-07 DIAGNOSIS — E1165 Type 2 diabetes mellitus with hyperglycemia: Secondary | ICD-10-CM | POA: Diagnosis not present

## 2020-07-07 DIAGNOSIS — E7800 Pure hypercholesterolemia, unspecified: Secondary | ICD-10-CM | POA: Diagnosis not present

## 2020-07-14 DIAGNOSIS — R3129 Other microscopic hematuria: Secondary | ICD-10-CM | POA: Diagnosis not present

## 2020-07-14 DIAGNOSIS — I10 Essential (primary) hypertension: Secondary | ICD-10-CM | POA: Diagnosis not present

## 2020-07-14 DIAGNOSIS — E1165 Type 2 diabetes mellitus with hyperglycemia: Secondary | ICD-10-CM | POA: Diagnosis not present

## 2020-07-14 DIAGNOSIS — N189 Chronic kidney disease, unspecified: Secondary | ICD-10-CM | POA: Diagnosis not present

## 2020-08-11 DIAGNOSIS — I10 Essential (primary) hypertension: Secondary | ICD-10-CM | POA: Diagnosis not present

## 2020-08-11 DIAGNOSIS — Z125 Encounter for screening for malignant neoplasm of prostate: Secondary | ICD-10-CM | POA: Diagnosis not present

## 2020-08-11 DIAGNOSIS — Z0000 Encounter for general adult medical examination without abnormal findings: Secondary | ICD-10-CM | POA: Diagnosis not present

## 2020-08-11 DIAGNOSIS — Z23 Encounter for immunization: Secondary | ICD-10-CM | POA: Diagnosis not present

## 2020-08-11 DIAGNOSIS — E119 Type 2 diabetes mellitus without complications: Secondary | ICD-10-CM | POA: Diagnosis not present

## 2020-12-02 DIAGNOSIS — R3129 Other microscopic hematuria: Secondary | ICD-10-CM | POA: Diagnosis not present

## 2020-12-02 DIAGNOSIS — N189 Chronic kidney disease, unspecified: Secondary | ICD-10-CM | POA: Diagnosis not present

## 2020-12-02 DIAGNOSIS — I10 Essential (primary) hypertension: Secondary | ICD-10-CM | POA: Diagnosis not present

## 2020-12-02 DIAGNOSIS — E1165 Type 2 diabetes mellitus with hyperglycemia: Secondary | ICD-10-CM | POA: Diagnosis not present

## 2021-02-09 DIAGNOSIS — H43813 Vitreous degeneration, bilateral: Secondary | ICD-10-CM | POA: Diagnosis not present

## 2021-02-23 DIAGNOSIS — H25041 Posterior subcapsular polar age-related cataract, right eye: Secondary | ICD-10-CM | POA: Diagnosis not present

## 2021-02-24 ENCOUNTER — Encounter: Payer: Self-pay | Admitting: Ophthalmology

## 2021-03-05 NOTE — Discharge Instructions (Signed)

## 2021-03-10 ENCOUNTER — Ambulatory Visit: Payer: BC Managed Care – PPO | Admitting: Anesthesiology

## 2021-03-10 ENCOUNTER — Encounter: Payer: Self-pay | Admitting: Ophthalmology

## 2021-03-10 ENCOUNTER — Other Ambulatory Visit: Payer: Self-pay

## 2021-03-10 ENCOUNTER — Ambulatory Visit
Admission: RE | Admit: 2021-03-10 | Discharge: 2021-03-10 | Disposition: A | Discharge status: Home / Self Care | Payer: BC Managed Care – PPO | Attending: Ophthalmology | Admitting: Ophthalmology

## 2021-03-10 ENCOUNTER — Encounter
Admission: RE | Disposition: A | Discharge status: Home / Self Care | Payer: Self-pay | Source: Home / Self Care | Attending: Ophthalmology

## 2021-03-10 DIAGNOSIS — H25041 Posterior subcapsular polar age-related cataract, right eye: Secondary | ICD-10-CM | POA: Diagnosis not present

## 2021-03-10 DIAGNOSIS — H2511 Age-related nuclear cataract, right eye: Secondary | ICD-10-CM | POA: Diagnosis not present

## 2021-03-10 DIAGNOSIS — E1136 Type 2 diabetes mellitus with diabetic cataract: Secondary | ICD-10-CM | POA: Insufficient documentation

## 2021-03-10 DIAGNOSIS — N289 Disorder of kidney and ureter, unspecified: Secondary | ICD-10-CM | POA: Insufficient documentation

## 2021-03-10 DIAGNOSIS — Z6841 Body Mass Index (BMI) 40.0 and over, adult: Secondary | ICD-10-CM | POA: Diagnosis not present

## 2021-03-10 DIAGNOSIS — Z87891 Personal history of nicotine dependence: Secondary | ICD-10-CM | POA: Diagnosis not present

## 2021-03-10 DIAGNOSIS — G4730 Sleep apnea, unspecified: Secondary | ICD-10-CM | POA: Diagnosis not present

## 2021-03-10 DIAGNOSIS — M1990 Unspecified osteoarthritis, unspecified site: Secondary | ICD-10-CM | POA: Diagnosis not present

## 2021-03-10 DIAGNOSIS — I10 Essential (primary) hypertension: Secondary | ICD-10-CM | POA: Diagnosis not present

## 2021-03-10 DIAGNOSIS — H25811 Combined forms of age-related cataract, right eye: Secondary | ICD-10-CM | POA: Diagnosis not present

## 2021-03-10 HISTORY — PX: CATARACT EXTRACTION W/PHACO: SHX586

## 2021-03-10 LAB — GLUCOSE, CAPILLARY
Glucose-Capillary: 116 mg/dL — ABNORMAL HIGH (ref 70–99)
Glucose-Capillary: 120 mg/dL — ABNORMAL HIGH (ref 70–99)

## 2021-03-10 SURGERY — PHACOEMULSIFICATION, CATARACT, WITH IOL INSERTION
Anesthesia: Monitor Anesthesia Care | Site: Eye | Laterality: Right

## 2021-03-10 MED ORDER — LACTATED RINGERS IV SOLN: INTRAVENOUS | Status: DC

## 2021-03-10 MED ORDER — MIDAZOLAM HCL 2 MG/2ML IJ SOLN
INTRAMUSCULAR | Status: DC | PRN
  Administered 2021-03-10 (×2): 1 mg via INTRAVENOUS

## 2021-03-10 MED ORDER — ARMC OPHTHALMIC DILATING DROPS
1.0000 "application " | OPHTHALMIC | Status: DC | PRN
  Administered 2021-03-10 (×3): 1 via OPHTHALMIC

## 2021-03-10 MED ORDER — BRIMONIDINE TARTRATE-TIMOLOL 0.2-0.5 % OP SOLN
OPHTHALMIC | Status: DC | PRN
  Administered 2021-03-10: 1 [drp] via OPHTHALMIC

## 2021-03-10 MED ORDER — SIGHTPATH DOSE#1 NA CHONDROIT SULF-NA HYALURON 40-17 MG/ML IO SOLN
INTRAOCULAR | Status: DC | PRN
  Administered 2021-03-10: 1 mL via INTRAOCULAR

## 2021-03-10 MED ORDER — MOXIFLOXACIN HCL 0.5 % OP SOLN
OPHTHALMIC | Status: DC | PRN
  Administered 2021-03-10: 0.2 mL via OPHTHALMIC

## 2021-03-10 MED ORDER — SIGHTPATH DOSE#1 BSS IO SOLN
INTRAOCULAR | Status: DC | PRN
  Administered 2021-03-10: 114 mL via OPHTHALMIC

## 2021-03-10 MED ORDER — SIGHTPATH DOSE#1 BSS IO SOLN
INTRAOCULAR | Status: DC | PRN
  Administered 2021-03-10: 1 mL via INTRAMUSCULAR

## 2021-03-10 MED ORDER — TETRACAINE HCL 0.5 % OP SOLN
1.0000 [drp] | OPHTHALMIC | Status: DC | PRN
  Administered 2021-03-10 (×3): 1 [drp] via OPHTHALMIC

## 2021-03-10 MED ORDER — SIGHTPATH DOSE#1 BSS IO SOLN
INTRAOCULAR | Status: DC | PRN
  Administered 2021-03-10: 15 mL

## 2021-03-10 MED ORDER — FENTANYL CITRATE (PF) 100 MCG/2ML IJ SOLN
INTRAMUSCULAR | Status: DC | PRN
  Administered 2021-03-10 (×2): 50 ug via INTRAVENOUS

## 2021-03-10 SURGICAL SUPPLY — 10 items
CATARACT SUITE SIGHTPATH (MISCELLANEOUS) ×2 IMPLANT
FEE CATARACT SUITE SIGHTPATH (MISCELLANEOUS) ×1 IMPLANT
GLOVE SURG ENC TEXT LTX SZ8 (GLOVE) ×2 IMPLANT
GLOVE SURG TRIUMPH 8.0 PF LTX (GLOVE) ×2 IMPLANT
LENS IOL TECNIS EYHANCE 20.5 (Intraocular Lens) ×1 IMPLANT
NDL FILTER BLUNT 18X1 1/2 (NEEDLE) ×1 IMPLANT
NEEDLE FILTER BLUNT 18X 1/2SAF (NEEDLE) ×1
NEEDLE FILTER BLUNT 18X1 1/2 (NEEDLE) ×1 IMPLANT
SYR 3ML LL SCALE MARK (SYRINGE) ×2 IMPLANT
WATER STERILE IRR 250ML POUR (IV SOLUTION) ×2 IMPLANT

## 2021-03-10 NOTE — Anesthesia Procedure Notes (Signed)
Procedure Name: MAC Date/Time: 03/10/2021 7:48 AM Performed by: Cameron Ali, CRNA Pre-anesthesia Checklist: Patient identified, Emergency Drugs available, Suction available, Timeout performed and Patient being monitored Patient Re-evaluated:Patient Re-evaluated prior to induction Oxygen Delivery Method: Nasal cannula Placement Confirmation: positive ETCO2

## 2021-03-10 NOTE — H&P (Signed)
[facility]   Primary Care Physician:  Rolm Gala, MD Ophthalmologist: Dr.Lucresia Simic  Pre-Procedure History & Physical: HPI:  [name] [name] is a 61 y.o. male here for cataract surgery.   Past Medical History:  Diagnosis Date   Diabetes mellitus without complication (HCC)    type 2    Heart murmur    Hx: of as achild   Hypertension    Inguinal hernia    Hx: of left groin   Kidney stones    Hx: of   OA (osteoarthritis) of hip    Hx: of   Sleep apnea    No CPAP    Past Surgical History:  Procedure Laterality Date   COLONOSCOPY     Hx: of   FOOT ARTHRODESIS Left 05/29/2014   Procedure: Left Triple Arthrodesis, Possible Achilles Lengthening;  Surgeon: Eldred Manges, MD;  Location: [facility] OR;  Service: Orthopedics;  Laterality: Left;   FOOT ARTHRODESIS Right 01/10/2015   Procedure: Right Triple Arthrodesis, Achilles Lengthening;  Surgeon: Eldred Manges, MD;  Location: [facility] OR;  Service: Orthopedics;  Laterality: Right;   FOOT ARTHRODESIS, TRIPLE Left 05/29/2014   HARDWARE REMOVAL Left 01/10/2015   Procedure: Left Foot Screw Removal ;  Surgeon: Eldred Manges, MD;  Location: [facility] OR;  Service: Orthopedics;  Laterality: Left;   TOTAL HIP ARTHROPLASTY Left 03/02/2013   DR [name]   TOTAL HIP ARTHROPLASTY Left 03/02/2013   Procedure: TOTAL HIP ARTHROPLASTY ANTERIOR APPROACH;  Surgeon: Eldred Manges, MD;  Location: [facility] OR;  Service: Orthopedics;  Laterality: Left;  Left Total Hip Arthroplasty Direct Anterior Approach   TOTAL HIP ARTHROPLASTY Right 05/06/2017   Procedure: RIGHT TOTAL HIP ARTHROPLASTY DIRECT ANTERIOR;  Surgeon: Eldred Manges, MD;  Location: [facility] OR;  Service: Orthopedics;  Laterality: Right;    Prior to Admission medications   Medication Sig Start Date End Date Taking? Authorizing Provider  Ascorbic Acid (VITAMIN C) 1000 MG tablet Take 1,000 mg by mouth daily.   Yes [provider]  aspirin EC 81 MG tablet Take 81 mg by mouth daily.   Yes [provider]   atorvastatin (LIPITOR) 10 MG tablet Take 10 mg by mouth daily.   Yes [provider]  b complex vitamins tablet Take 1 tablet by mouth daily.   Yes [provider]  carvedilol (COREG) 6.25 MG tablet Take 6.25 mg by mouth 2 (two) times daily with a meal.   Yes [provider]  Cholecalciferol (VITAMIN D-3) 125 MCG (5000 UT) TABS Take by mouth daily.   Yes [provider]  Cinnamon 500 MG TABS Take 2,000 mg by mouth daily.   Yes [provider]  empagliflozin (JARDIANCE) 25 MG TABS tablet Take 25 mg by mouth daily.   Yes [provider]  glimepiride (AMARYL) 4 MG tablet Take 4 mg by mouth 2 (two) times daily with a meal.    Yes [provider]  hydrochlorothiazide (HYDRODIURIL) 25 MG tablet Take 25 mg by mouth daily.    Yes [provider]  JANUMET XR 50-1000 MG TB24 Take 1 tablet by mouth 2 (two) times daily. 07/25/17  Yes [provider]  LANTUS SOLOSTAR 100 UNIT/ML Solostar Pen Inject 25-60 Units into the skin 2 (two) times daily. Semglee    Inject 60 units in the morning & 25 units in the evening 03/03/17  Yes [provider]  losartan (COZAAR) 100 MG tablet Take 100 mg by mouth daily.   Yes [provider]  zinc gluconate 50 MG tablet Take 50 mg by mouth daily.   Yes [provider]  CONTOUR NEXT TEST test strip  01/26/17   [provider]  glucose blood ([employer] TEST STRIPS) test strip Check blood sugar three times daily.  250.02  One touch ultra or other test strip of patient's choice. 07/25/12   [provider]  Insulin Pen Needle (B-D ULTRAFINE III SHORT PEN) 31G X 8 MM MISC Inject insulin once daily. 05/31/12   [provider]    Allergies as of 02/10/2021 - Review Complete 02/10/2018  Allergen Reaction Noted   Shellfish allergy Anaphylaxis 02/20/2013   Penicillins Hives and Other (See Comments) 02/20/2013    Family History  Problem Relation Age of Onset    Diabetes Mother    Hypertension Mother    Cancer - Other Mother    Cancer - Lung Father    Arthritis Sister    Heart disease Sister     Social History   Socioeconomic History   Marital status: Married    Spouse name: Not on file   Number of children: Not on file   Years of education: Not on file   Highest education level: Not on file  Occupational History   Not on file  Tobacco Use   Smoking status: Former    Types: Cigars, Cigarettes    Quit date: 1998    Years since quitting: 25.1   Smokeless tobacco: Current    Types: Snuff   Tobacco comments:    Quit smoking 1998  Vaping Use   Vaping Use: Never used  Substance and Sexual Activity   Alcohol use: Yes    Alcohol/week: 2.0 standard drinks    Types: 2 Shots of liquor per week    Comment: once a week    Drug use: No   Sexual activity: Not on file  Other Topics Concern   Not on file  Social History Narrative   Not on file   Social Determinants of Health   Financial Resource Strain: Not on file  Food Insecurity: Not on file  Transportation Needs: Not on file  Physical Activity: Not on file  Stress: Not on file  Social Connections: Not on file  Intimate Partner Violence: Not on file    Review of Systems: See HPI, otherwise negative ROS  Physical Exam: BP (!) 153/85    Pulse 80    Temp (!) 97.3 F (36.3 C) (Temporal)    Resp (!) 25    Ht 6' (1.829 m)    Wt (!) 145.6 kg    SpO2 97%    BMI 43.54 kg/m  General:   Alert, cooperative in NAD Head:  Normocephalic and atraumatic. Respiratory:  Normal work of breathing. Cardiovascular:  RRR  Impression/Plan: [name] [name] is here for cataract surgery.  Risks, benefits, limitations, and alternatives regarding cataract surgery have been reviewed with the patient.  Questions have been answered.  All parties agreeable.   [name], MD  03/10/2021, 7:34 AM

## 2021-03-10 NOTE — Transfer of Care (Signed)
Immediate Anesthesia Transfer of Care Note  Patient: [name] [name]  Procedure(s) Performed: CATARACT EXTRACTION PHACO AND INTRAOCULAR LENS PLACEMENT (IOC) RIGHT DIABETIC 20.16 01:59.8 (Right: Eye)  Patient Location: PACU  Anesthesia Type: MAC  Level of Consciousness: awake, alert  and patient cooperative  Airway and Oxygen Therapy: Patient Spontanous Breathing and Patient connected to supplemental oxygen  Post-op Assessment: Post-op Vital signs reviewed, Patient's Cardiovascular Status Stable, Respiratory Function Stable, Patent Airway and No signs of Nausea or vomiting  Post-op Vital Signs: Reviewed and stable  Complications: No notable events documented.

## 2021-03-10 NOTE — Anesthesia Preprocedure Evaluation (Signed)
Anesthesia Evaluation  Patient identified by MRN, date of birth, ID band Patient awake    Reviewed: Allergy & Precautions, NPO status , Patient's Chart, lab work & pertinent test results  Airway Mallampati: III  TM Distance: >3 FB Neck ROM: Full    Dental no notable dental hx.    Pulmonary sleep apnea , former smoker,    Pulmonary exam normal        Cardiovascular hypertension, Normal cardiovascular exam     Neuro/Psych negative neurological ROS  negative psych ROS   GI/Hepatic negative GI ROS, Neg liver ROS,   Endo/Other  diabetes, Type 2Morbid obesity (BMI 46)  Renal/GU Renal disease     Musculoskeletal  (+) Arthritis ,   Abdominal (+) + obese,   Peds  Hematology   Anesthesia Other Findings   Reproductive/Obstetrics                             Anesthesia Physical Anesthesia Plan  ASA: 3  Anesthesia Plan: MAC   Post-op Pain Management: Minimal or no pain anticipated   Induction: Intravenous  PONV Risk Score and Plan: 1 and TIVA, Midazolam and Treatment may vary due to age or medical condition  Airway Management Planned: Nasal Cannula and Natural Airway  Additional Equipment: None  Intra-op Plan:   Post-operative Plan:   Informed Consent: I have reviewed the patients History and Physical, chart, labs and discussed the procedure including the risks, benefits and alternatives for the proposed anesthesia with the patient or authorized representative who has indicated his/her understanding and acceptance.     Dental advisory given  Plan Discussed with: CRNA  Anesthesia Plan Comments:         Anesthesia Quick Evaluation

## 2021-03-10 NOTE — Anesthesia Postprocedure Evaluation (Signed)
Anesthesia Post Note  Patient: [name] [name]  Procedure(s) Performed: CATARACT EXTRACTION PHACO AND INTRAOCULAR LENS PLACEMENT (IOC) RIGHT DIABETIC 20.16 01:59.8 (Right: Eye)     Patient location during evaluation: PACU Anesthesia Type: MAC Level of consciousness: awake and alert Pain management: pain level controlled Vital Signs Assessment: post-procedure vital signs reviewed and stable Respiratory status: spontaneous breathing and nonlabored ventilation Cardiovascular status: blood pressure returned to baseline Postop Assessment: no apparent nausea or vomiting Anesthetic complications: no   No notable events documented.  Christena Sunderlin [employer]

## 2021-03-10 NOTE — Op Note (Signed)
PREOPERATIVE DIAGNOSIS:  Nuclear sclerotic cataract of the right eye.   POSTOPERATIVE DIAGNOSIS:  H25.041 Cataract   OPERATIVE PROCEDURE:ORPROCALL@   SURGEON:  [name] Manila, MD.   ANESTHESIA:  Anesthesiologist: Fletcher Anon, MD CRNA: Maree Krabbe, CRNA  1.      Managed anesthesia care. 2.      0.76ml of Shugarcaine was instilled in the eye following the paracentesis.   COMPLICATIONS:  None.   TECHNIQUE:   Stop and chop   DESCRIPTION OF PROCEDURE:  The patient was examined and consented in the preoperative holding area where the aforementioned topical anesthesia was applied to the right eye and then brought back to the Operating Room where the right eye was prepped and draped in the usual sterile ophthalmic fashion and a lid speculum was placed. A paracentesis was created with the side port blade and the anterior chamber was filled with viscoelastic. A near clear corneal incision was performed with the steel keratome. A continuous curvilinear capsulorrhexis was performed with a cystotome followed by the capsulorrhexis forceps. Hydrodissection and hydrodelineation were carried out with BSS on a blunt cannula. The lens was removed in a stop and chop  technique and the remaining cortical material was removed with the irrigation-aspiration handpiece. The capsular bag was inflated with viscoelastic and the Technis [id number]  lens was placed in the capsular bag without complication. The remaining viscoelastic was removed from the eye with the irrigation-aspiration handpiece. The wounds were hydrated. The anterior chamber was flushed with BSS and the eye was inflated to physiologic pressure. 0.52ml of Vigamox was placed in the anterior chamber. The wounds were found to be water tight. The eye was dressed with Combigan. The patient was given protective glasses to wear throughout the day and a shield with which to sleep tonight. The patient was also given drops with which to begin a drop regimen today and  will follow-up with me in one day. Implant Name Type Inv. Item Serial No. Manufacturer Lot No. LRB No. Used Action  LENS IOL TECNIS EYHANCE 20.5 - [id number] Intraocular Lens LENS IOL TECNIS EYHANCE 20.5 [id number] [employer]  Right 1 Implanted   Procedure(s) with comments: CATARACT EXTRACTION PHACO AND INTRAOCULAR LENS PLACEMENT (IOC) RIGHT DIABETIC 20.16 01:59.8 (Right) - Diabetic  Electronically signed: Galen [name] 03/10/2021 8:05 AM

## 2021-03-11 ENCOUNTER — Encounter: Payer: Self-pay | Admitting: Ophthalmology

## 2021-06-25 DIAGNOSIS — N189 Chronic kidney disease, unspecified: Secondary | ICD-10-CM | POA: Diagnosis not present

## 2021-06-25 DIAGNOSIS — E7800 Pure hypercholesterolemia, unspecified: Secondary | ICD-10-CM | POA: Diagnosis not present

## 2021-06-25 DIAGNOSIS — E1165 Type 2 diabetes mellitus with hyperglycemia: Secondary | ICD-10-CM | POA: Diagnosis not present

## 2021-06-30 DIAGNOSIS — I10 Essential (primary) hypertension: Secondary | ICD-10-CM | POA: Diagnosis not present

## 2021-06-30 DIAGNOSIS — E1165 Type 2 diabetes mellitus with hyperglycemia: Secondary | ICD-10-CM | POA: Diagnosis not present

## 2021-06-30 DIAGNOSIS — N189 Chronic kidney disease, unspecified: Secondary | ICD-10-CM | POA: Diagnosis not present

## 2021-06-30 DIAGNOSIS — E7800 Pure hypercholesterolemia, unspecified: Secondary | ICD-10-CM | POA: Diagnosis not present

## 2021-09-23 DIAGNOSIS — H43813 Vitreous degeneration, bilateral: Secondary | ICD-10-CM | POA: Diagnosis not present

## 2021-09-29 ENCOUNTER — Ambulatory Visit (INDEPENDENT_AMBULATORY_CARE_PROVIDER_SITE_OTHER): Payer: BC Managed Care – PPO | Admitting: Orthopaedic Surgery

## 2021-09-29 ENCOUNTER — Ambulatory Visit (INDEPENDENT_AMBULATORY_CARE_PROVIDER_SITE_OTHER): Payer: BC Managed Care – PPO

## 2021-09-29 ENCOUNTER — Encounter: Payer: Self-pay | Admitting: Orthopaedic Surgery

## 2021-09-29 VITALS — BP 135/84 | HR 92 | Ht 74.0 in | Wt 325.0 lb

## 2021-09-29 DIAGNOSIS — M545 Low back pain, unspecified: Secondary | ICD-10-CM

## 2021-09-29 DIAGNOSIS — M25552 Pain in left hip: Secondary | ICD-10-CM

## 2021-09-29 DIAGNOSIS — M47816 Spondylosis without myelopathy or radiculopathy, lumbar region: Secondary | ICD-10-CM

## 2021-09-29 NOTE — Progress Notes (Signed)
Office Visit Note   Patient: [name] [name]           Date of Birth: October 07, 1960           MRN: [id number] Visit Date: 09/29/2021              Requested by: Rolm Gala, MD [address], [address] [place],  [place] [address] PCP: Rolm Gala, MD   Assessment & Plan: Visit Diagnoses:  1. Acute bilateral low back pain, unspecified whether sciatica present   2. Pain in left hip     Plan: So patient symptoms may be related to the L1-2 level.  He is ambulatory no evidence of abnormality in the hip.  We will see how he does if he has increased symptoms he can return.  We discussed this may be related to L1-2 level spondylosis with foraminal narrowing.  He will continue to walk and try to work on some gradual weight loss.  Follow-Up Instructions: No follow-ups on file.   Orders:  Orders Placed This Encounter  Procedures   XR HIP UNILAT W OR W/O PELVIS 2-3 VIEWS LEFT   XR Lumbar Spine 2-3 Views   No orders of the defined types were placed in this encounter.     Procedures: No procedures performed   Clinical Data: No additional findings.   Subjective: Chief Complaint  Patient presents with   Lower Back - Pain   Left Hip - Pain    HPI 61 year old male post bilateral total hip arthroplasties in the past.  Had left total hip in 2015.  This is been a year since his surgery he states he has been having some discomfort in the groin anteriorly over the trochanter at times.  Had a right hip dislocation after his 2019 procedure had some therapy and has not had any instability in his right hip since that time.  Both x-rays right left hip shows good position.  He has had some discomfort for 2 months.  Sometimes he feels like it is tight pulling through the groin.  Has not really noticed numbness about the hip but states he does notice some numbness at the end of the day with his feet.  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  Patient continues to work.   He denies associated bowel or bladder symptoms.  Review of Systems updated unchanged   Objective: Vital Signs: BP 135/84   Pulse 92   Ht 6\' 2"  (1.88 m)   Wt (!) 325 lb (147.4 kg)   BMI 41.73 kg/m   Physical Exam Constitutional:      Appearance: He is well-developed.  HENT:     Head: Normocephalic and atraumatic.     Right Ear: External ear normal.     Left Ear: External ear normal.  Eyes:     Pupils: Pupils are equal, round, and reactive to light.  Neck:     Thyroid: No thyromegaly.     Trachea: No tracheal deviation.  Cardiovascular:     Rate and Rhythm: Normal rate.  Pulmonary:     Effort: Pulmonary effort is normal.     Breath sounds: No wheezing.  Abdominal:     General: Bowel sounds are normal.     Palpations: Abdomen is soft.  Musculoskeletal:     Cervical back: Neck supple.  Skin:    General: Skin is warm and dry.     Capillary Refill: Capillary refill takes less than 2 seconds.  Neurological:  Mental Status: He is alert and oriented to person, place, and time.  Psychiatric:        Behavior: Behavior normal.        Thought Content: Thought content normal.        Judgment: Judgment normal.     Ortho Exam no pain with range of motion of the hip.  Sensations intact in the thigh and normal gait noted.  Specialty Comments:  No specialty comments available.  Imaging: No results found.   PMFS History: Patient Active Problem List   Diagnosis Date Noted   Morbid (severe) obesity due to excess calories (HCC) 12/20/2017   Hip dislocation, right (HCC) 06/15/2017   Arthritis of right hip 05/06/2017   History of total right hip replacement 02/07/2017   Arthrodesis status 01/10/2015   Postoperative fever 05/31/2014   Acute respiratory failure with hypoxia (HCC) 05/31/2014   Status post arthrodesis 05/29/2014   Unilateral primary osteoarthritis, right hip 03/02/2013   Past Medical History:  Diagnosis Date   Diabetes mellitus without complication (HCC)     type 2    Heart murmur    Hx: of as achild   Hypertension    Inguinal hernia    Hx: of left groin   Kidney stones    Hx: of   OA (osteoarthritis) of hip    Hx: of   Sleep apnea    No CPAP    Family History  Problem Relation Age of Onset   Diabetes Mother    Hypertension Mother    Cancer - Other Mother    Cancer - Lung Father    Arthritis Sister    Heart disease Sister     Past Surgical History:  Procedure Laterality Date   CATARACT EXTRACTION W/PHACO Right 03/10/2021   Procedure: CATARACT EXTRACTION PHACO AND INTRAOCULAR LENS PLACEMENT (IOC) RIGHT DIABETIC 20.16 01:59.8;  Surgeon: [name], MD;  Location: [facility];  Service: Ophthalmology;  Laterality: Right;  Diabetic   COLONOSCOPY     Hx: of   FOOT ARTHRODESIS Left 05/29/2014   Procedure: Left Triple Arthrodesis, Possible Achilles Lengthening;  Surgeon: Eldred Manges, MD;  Location: [facility] OR;  Service: Orthopedics;  Laterality: Left;   FOOT ARTHRODESIS Right 01/10/2015   Procedure: Right Triple Arthrodesis, Achilles Lengthening;  Surgeon: Eldred Manges, MD;  Location: [facility] OR;  Service: Orthopedics;  Laterality: Right;   FOOT ARTHRODESIS, TRIPLE Left 05/29/2014   HARDWARE REMOVAL Left 01/10/2015   Procedure: Left Foot Screw Removal ;  Surgeon: Eldred Manges, MD;  Location: [facility] OR;  Service: Orthopedics;  Laterality: Left;   TOTAL HIP ARTHROPLASTY Left 03/02/2013   DR [name]   TOTAL HIP ARTHROPLASTY Left 03/02/2013   Procedure: TOTAL HIP ARTHROPLASTY ANTERIOR APPROACH;  Surgeon: Eldred Manges, MD;  Location: [facility] OR;  Service: Orthopedics;  Laterality: Left;  Left Total Hip Arthroplasty Direct Anterior Approach   TOTAL HIP ARTHROPLASTY Right 05/06/2017   Procedure: RIGHT TOTAL HIP ARTHROPLASTY DIRECT ANTERIOR;  Surgeon: Eldred Manges, MD;  Location: [facility] OR;  Service: Orthopedics;  Laterality: Right;   Social History   Occupational History   Not on file  Tobacco Use   Smoking status: Former    Types: Cigars,  Cigarettes    Quit date: 1998    Years since quitting: 25.6   Smokeless tobacco: Current    Types: Snuff   Tobacco comments:    Quit smoking 1998  Vaping Use   Vaping Use: Never used  Substance and Sexual  Activity   Alcohol use: Yes    Alcohol/week: 2.0 standard drinks of alcohol    Types: 2 Shots of liquor per week    Comment: once a week    Drug use: No   Sexual activity: Not on file

## 2021-11-15 ENCOUNTER — Ambulatory Visit
Admission: EM | Admit: 2021-11-15 | Discharge: 2021-11-15 | Disposition: A | Discharge status: Home / Self Care | Payer: BC Managed Care – PPO | Attending: Family Medicine | Admitting: Family Medicine

## 2021-11-15 DIAGNOSIS — U071 COVID-19: Secondary | ICD-10-CM

## 2021-11-15 MED ORDER — MOLNUPIRAVIR 200 MG PO CAPS: 4.0000 | ORAL_CAPSULE | Freq: Two times a day (BID) | ORAL | 0 refills | Status: AC

## 2021-11-15 MED ORDER — MOLNUPIRAVIR 200 MG PO CAPS: 4.0000 | ORAL_CAPSULE | Freq: Two times a day (BID) | ORAL | 0 refills | Status: DC

## 2021-11-15 NOTE — Discharge Instructions (Signed)
Your home test for COVID-19 was positive, meaning that you were infected with the novel coronavirus and could give the germ to others.  Please continue isolation at home for at least 5 days since the start of your symptoms. Once you complete your 5 day quarantine, you may return to normal activities as long as you've not had a fever for over 24 hours(without taking fever reducing medicine) and your symptoms are improving. Be sure to wear a mask until Day 11.    You can take Tylenol and/or Ibuprofen as needed for fever reduction and pain relief.    For cough: honey 1/2 to 1 teaspoon (you can dilute the honey in water or another fluid).  You can also use guaifenesin and dextromethorphan for cough. You can use a humidifier for chest congestion and cough.  If you don't have a humidifier, you can sit in the bathroom with the hot shower running.      For sore throat: try warm salt water gargles, cepacol lozenges, throat spray, warm tea or water with lemon/honey, popsicles or ice, or OTC cold relief medicine for throat discomfort.    For congestion: take a daily anti-histamine like Zyrtec, Claritin, and a oral decongestant, such as pseudoephedrine.  You can also use Flonase 1-2 sprays in each nostril daily.    It is important to stay hydrated: drink plenty of fluids (water, gatorade/powerade/pedialyte, juices, or teas) to keep your throat moisturized and help further relieve irritation/discomfort.    Return or go to the Emergency Department if symptoms worsen or do not improve in the next few days

## 2021-11-15 NOTE — ED Provider Notes (Addendum)
[facility]    CSN: [id number] Arrival date & time: 11/15/21  1143      History   Chief Complaint Chief Complaint  Patient presents with   Cough   Chills    HPI [name] [name] is a 61 y.o. male.   HPI   [name] [name] presents for after testing positive for COVID yesterday. Friday night he was out and about and when he returned home he started having chills and coughing.  He has not been able to eat much fluids as he does not have an appetite.  Denies any chest pain or shortness of breath.  He has felt warm and been taking over-the-counter medications without relief.  Has had an intermittent headache.  No vomiting or diarrhea.  Has body aches and nasal congestion but now its more runny. He brought his positive home COVID test here today.     Past Medical History:  Diagnosis Date   Diabetes mellitus without complication (HCC)    type 2    Heart murmur    Hx: of as achild   Hypertension    Inguinal hernia    Hx: of left groin   Kidney stones    Hx: of   OA (osteoarthritis) of hip    Hx: of   Sleep apnea    No CPAP    Patient Active Problem List   Diagnosis Date Noted   Morbid (severe) obesity due to excess calories (HCC) 12/20/2017   Hip dislocation, right (HCC) 06/15/2017   Arthritis of right hip 05/06/2017   History of total right hip replacement 02/07/2017   Arthrodesis status 01/10/2015   Postoperative fever 05/31/2014   Acute respiratory failure with hypoxia (HCC) 05/31/2014   Status post arthrodesis 05/29/2014   Unilateral primary osteoarthritis, right hip 03/02/2013    Past Surgical History:  Procedure Laterality Date   CATARACT EXTRACTION W/PHACO Right 03/10/2021   Procedure: CATARACT EXTRACTION PHACO AND INTRAOCULAR LENS PLACEMENT (IOC) RIGHT DIABETIC 20.16 01:59.8;  Surgeon: [name], MD;  Location: [facility];  Service: Ophthalmology;  Laterality: Right;  Diabetic   COLONOSCOPY     Hx: of   FOOT ARTHRODESIS Left  05/29/2014   Procedure: Left Triple Arthrodesis, Possible Achilles Lengthening;  Surgeon: Eldred Manges, MD;  Location: [facility] OR;  Service: Orthopedics;  Laterality: Left;   FOOT ARTHRODESIS Right 01/10/2015   Procedure: Right Triple Arthrodesis, Achilles Lengthening;  Surgeon: Eldred Manges, MD;  Location: [facility] OR;  Service: Orthopedics;  Laterality: Right;   FOOT ARTHRODESIS, TRIPLE Left 05/29/2014   HARDWARE REMOVAL Left 01/10/2015   Procedure: Left Foot Screw Removal ;  Surgeon: Eldred Manges, MD;  Location: [facility];  Service: Orthopedics;  Laterality: Left;   TOTAL HIP ARTHROPLASTY Left 03/02/2013   DR [name]   TOTAL HIP ARTHROPLASTY Left 03/02/2013   Procedure: TOTAL HIP ARTHROPLASTY ANTERIOR APPROACH;  Surgeon: Eldred Manges, MD;  Location: [facility] OR;  Service: Orthopedics;  Laterality: Left;  Left Total Hip Arthroplasty Direct Anterior Approach   TOTAL HIP ARTHROPLASTY Right 05/06/2017   Procedure: RIGHT TOTAL HIP ARTHROPLASTY DIRECT ANTERIOR;  Surgeon: Eldred Manges, MD;  Location: [facility] OR;  Service: Orthopedics;  Laterality: Right;       Home Medications    Prior to Admission medications   Medication Sig Start Date End Date Taking? Authorizing Provider  Ascorbic Acid (VITAMIN C) 1000 MG tablet Take 1,000 mg by mouth daily.   Yes [provider]  aspirin EC 81 MG tablet  Take 81 mg by mouth daily.   Yes [provider]  atorvastatin (LIPITOR) 10 MG tablet Take 10 mg by mouth daily.   Yes [provider]  b complex vitamins tablet Take 1 tablet by mouth daily.   Yes [provider]  carvedilol (COREG) 6.25 MG tablet Take 6.25 mg by mouth 2 (two) times daily with a meal.   Yes [provider]  Cholecalciferol (VITAMIN D-3) 125 MCG (5000 UT) TABS Take by mouth daily.   Yes [provider]  Cinnamon 500 MG TABS Take 2,000 mg by mouth daily.   Yes [provider]  CONTOUR NEXT TEST test strip  01/26/17  Yes [provider]  empagliflozin  (JARDIANCE) 25 MG TABS tablet Take 25 mg by mouth daily.   Yes [provider]  glimepiride (AMARYL) 4 MG tablet Take 4 mg by mouth 2 (two) times daily with a meal.    Yes [provider]  hydrochlorothiazide (HYDRODIURIL) 25 MG tablet Take 25 mg by mouth daily.    Yes [provider]  Insulin Pen Needle (B-D ULTRAFINE III SHORT PEN) 31G X 8 MM MISC Inject insulin once daily. 05/31/12  Yes [provider]  JANUMET XR 50-1000 MG TB24 Take 1 tablet by mouth 2 (two) times daily. 07/25/17  Yes [provider]  losartan (COZAAR) 100 MG tablet Take 100 mg by mouth daily.   Yes [provider]  zinc gluconate 50 MG tablet Take 50 mg by mouth daily.   Yes [provider]  LANTUS SOLOSTAR 100 UNIT/ML Solostar Pen Inject 25-60 Units into the skin 2 (two) times daily. Semglee    Inject 60 units in the morning & 25 units in the evening 03/03/17   [provider]  molnupiravir EUA (LAGEVRIO) 200 MG CAPS capsule Take 4 capsules (800 mg total) by mouth 2 (two) times daily for 5 days. 11/15/21 11/20/21  Lyndee Hensen, DO    Family History Family History  Problem Relation Age of Onset   Diabetes Mother    Hypertension Mother    Cancer - Other Mother    Cancer - Lung Father    Arthritis Sister    Heart disease Sister     Social History Social History   Tobacco Use   Smoking status: Former    Types: Cigars, Cigarettes    Quit date: 1998    Years since quitting: 25.8   Smokeless tobacco: Current    Types: Snuff   Tobacco comments:    Quit smoking 1998  Vaping Use   Vaping Use: Never used  Substance Use Topics   Alcohol use: Yes    Alcohol/week: 2.0 standard drinks of alcohol    Types: 2 Shots of liquor per week    Comment: once a week    Drug use: No     Allergies   Shellfish allergy and Penicillins   Review of Systems Review of Systems: negative unless otherwise stated in HPI.      Physical Exam Triage Vital  Signs ED Triage Vitals  Enc Vitals Group     BP 11/15/21 1259 107/72     Pulse Rate 11/15/21 1259 94     Resp --      Temp 11/15/21 1259 99.8 F (37.7 C)     Temp Source 11/15/21 1259 Oral     SpO2 11/15/21 1259 95 %     Weight 11/15/21 1257 (!) 340 lb (154.2 kg)     Height 11/15/21  1257 6\' 2"  (1.88 m)     Head Circumference --      Peak Flow --      Pain Score 11/15/21 1257 0     Pain Loc --      Pain Edu? --      Excl. in GC? --    No data found.  Updated Vital Signs BP 107/72 (BP Location: Right Arm)   Pulse 94   Temp 99.8 F (37.7 C) (Oral)   Ht 6\' 2"  (1.88 m)   Wt (!) 154.2 kg   SpO2 95%   BMI 43.65 kg/m   Visual Acuity Right Eye Distance:   Left Eye Distance:   Bilateral Distance:    Right Eye Near:   Left Eye Near:    Bilateral Near:     Physical Exam GEN:     alert, non-toxic appearing male in no distress     HENT:  mucus membranes moist, clear nasal discharge EYES:   pupils equal and reactive, EOMi ,  no scleral injection NECK:  normal ROM   RESP:  no increased work of breathing, clear to auscultation bilaterally CVS:   regular rate  and rhythm Skin:   warm and dry    UC Treatments / Results  Labs (all labs ordered are listed, but only abnormal results are displayed) Labs Reviewed - No data to display  EKG   Radiology No results found.  Procedures Procedures (including critical care time)  Medications Ordered in UC Medications - No data to display  Initial Impression / Assessment and Plan / UC Course  I have reviewed the triage vital signs and the nursing notes.  Pertinent labs & imaging results that were available during my care of the patient were reviewed by me and considered in my medical decision making (see chart for details).       Pt is a 61 y.o. male who presents for 2 days of respiratory symptoms after testing positive for COVID yesterday. Overall, he is well-appearing, well-hydrated and in no respiratory distress.   Discussed symptomatic treatment.  His nasal congestion bothers him the most and we several things to help. Reviewed symptomatic treatment.  Pulmonary exam he has equal aeration bilaterally, imaging deferred.    He interested in treatment for COVID. After shared decision making, he is interested in [facility].  Rx sent to pharmacy.  Quarantine instructions provided.  ED and return precautions and understanding voiced. Discussed MDM, treatment plan and plan for follow-up with patient/parent who agrees with plan.     Final Clinical Impressions(s) / UC Diagnoses   Final diagnoses:  COVID-19     Discharge Instructions      Your home test for COVID-19 was positive, meaning that you were infected with the novel coronavirus and could give the germ to others.  Please continue isolation at home for at least 5 days since the start of your symptoms. Once you complete your 5 day quarantine, you may return to normal activities as long as you've not had a fever for over 24 hours(without taking fever reducing medicine) and your symptoms are improving. Be sure to wear a mask until Day 11.    You can take Tylenol and/or Ibuprofen as needed for fever reduction and pain relief.    For cough: honey 1/2 to 1 teaspoon (you can dilute the honey in water or another fluid).  You can also use guaifenesin and dextromethorphan for cough. You can use a humidifier for chest congestion and cough.  If you don't have a humidifier, you can sit in the bathroom with the hot shower running.      For sore throat: try warm salt water gargles, cepacol lozenges, throat spray, warm tea or water with lemon/honey, popsicles or ice, or OTC cold relief medicine for throat discomfort.    For congestion: take a daily anti-histamine like Zyrtec, Claritin, and a oral decongestant, such as pseudoephedrine.  You can also use Flonase 1-2 sprays in each nostril daily.    It is important to stay hydrated: drink plenty of fluids (water,  gatorade/powerade/pedialyte, juices, or teas) to keep your throat moisturized and help further relieve irritation/discomfort.    Return or go to the Emergency Department if symptoms worsen or do not improve in the next few days      ED Prescriptions     Medication Sig Dispense Auth. Provider   molnupiravir EUA (LAGEVRIO) 200 MG CAPS capsule Take 4 capsules (800 mg total) by mouth 2 (two) times daily for 5 days. 40 capsule Naryiah Schley, DO      PDMP not reviewed this encounter.   Katha Cabal, DO 11/15/21 1722    Katha Cabal, DO 11/15/21 1723

## 2021-11-15 NOTE — ED Triage Notes (Signed)
Pt states he went out Friday night & came back started having chills, positive home covid test yesterday, cough when he first gets up, loss of appetite, pt states he has been taking some decongestants & chest clearing up

## 2021-12-23 DIAGNOSIS — E7800 Pure hypercholesterolemia, unspecified: Secondary | ICD-10-CM | POA: Diagnosis not present

## 2021-12-23 DIAGNOSIS — I10 Essential (primary) hypertension: Secondary | ICD-10-CM | POA: Diagnosis not present

## 2021-12-23 DIAGNOSIS — E1165 Type 2 diabetes mellitus with hyperglycemia: Secondary | ICD-10-CM | POA: Diagnosis not present

## 2021-12-30 DIAGNOSIS — E1165 Type 2 diabetes mellitus with hyperglycemia: Secondary | ICD-10-CM | POA: Diagnosis not present

## 2021-12-30 DIAGNOSIS — I10 Essential (primary) hypertension: Secondary | ICD-10-CM | POA: Diagnosis not present

## 2021-12-30 DIAGNOSIS — Z23 Encounter for immunization: Secondary | ICD-10-CM | POA: Diagnosis not present

## 2021-12-30 DIAGNOSIS — E7800 Pure hypercholesterolemia, unspecified: Secondary | ICD-10-CM | POA: Diagnosis not present

## 2022-03-24 DIAGNOSIS — E1165 Type 2 diabetes mellitus with hyperglycemia: Secondary | ICD-10-CM | POA: Diagnosis not present

## 2022-03-31 DIAGNOSIS — I10 Essential (primary) hypertension: Secondary | ICD-10-CM | POA: Diagnosis not present

## 2022-03-31 DIAGNOSIS — E1165 Type 2 diabetes mellitus with hyperglycemia: Secondary | ICD-10-CM | POA: Diagnosis not present

## 2022-04-22 DIAGNOSIS — H35379 Puckering of macula, unspecified eye: Secondary | ICD-10-CM | POA: Diagnosis not present

## 2022-04-22 DIAGNOSIS — E119 Type 2 diabetes mellitus without complications: Secondary | ICD-10-CM | POA: Diagnosis not present

## 2022-04-22 DIAGNOSIS — H43813 Vitreous degeneration, bilateral: Secondary | ICD-10-CM | POA: Diagnosis not present

## 2022-08-04 DIAGNOSIS — E1165 Type 2 diabetes mellitus with hyperglycemia: Secondary | ICD-10-CM | POA: Diagnosis not present

## 2022-08-11 DIAGNOSIS — E1165 Type 2 diabetes mellitus with hyperglycemia: Secondary | ICD-10-CM | POA: Diagnosis not present

## 2022-08-11 DIAGNOSIS — I10 Essential (primary) hypertension: Secondary | ICD-10-CM | POA: Diagnosis not present

## 2022-08-12 ENCOUNTER — Telehealth: Payer: Self-pay

## 2022-08-12 ENCOUNTER — Other Ambulatory Visit: Payer: Self-pay

## 2022-08-12 DIAGNOSIS — Z1211 Encounter for screening for malignant neoplasm of colon: Secondary | ICD-10-CM

## 2022-08-12 MED ORDER — NA SULFATE-K SULFATE-MG SULF 17.5-3.13-1.6 GM/177ML PO SOLN: 1.0000 | Freq: Once | ORAL | 0 refills | Status: AC

## 2022-08-12 NOTE — Telephone Encounter (Signed)
Gastroenterology Pre-Procedure Review  Request Date: 10/01/22 Requesting Physician: Dr. Allegra Lai  PATIENT REVIEW QUESTIONS: The patient responded to the following health history questions as indicated:    1. Are you having any GI issues? no 2. Do you have a personal history of Polyps? no 3. Do you have a family history of Colon Cancer or Polyps? yes (Mom and sister colon polyps) 4. Diabetes Mellitus? yes (Pt takes Farxiga (3) Janumet (2) Amaryl (1) stop dates noted on instructions) 5. Joint replacements in the past 12 months?no 6. Major health problems in the past 3 months?no 7. Any artificial heart valves, MVP, or defibrillator?no    MEDICATIONS & ALLERGIES:    Patient reports the following regarding taking any anticoagulation/antiplatelet therapy:   Plavix, Coumadin, Eliquis, Xarelto, Lovenox, Pradaxa, Brilinta, or Effient? no Aspirin? yes (81mg  daily)  Patient confirms/reports the following medications:  Current Outpatient Medications  Medication Sig Dispense Refill   Ascorbic Acid (VITAMIN C) 1000 MG tablet Take 1,000 mg by mouth daily.     aspirin EC 81 MG tablet Take 81 mg by mouth daily.     atorvastatin (LIPITOR) 10 MG tablet Take 10 mg by mouth daily.     b complex vitamins tablet Take 1 tablet by mouth daily.     carvedilol (COREG) 6.25 MG tablet Take 6.25 mg by mouth 2 (two) times daily with a meal.     Cholecalciferol (VITAMIN D-3) 125 MCG (5000 UT) TABS Take by mouth daily.     Cinnamon 500 MG TABS Take 2,000 mg by mouth daily.     CONTOUR NEXT TEST test strip   2   empagliflozin (JARDIANCE) 25 MG TABS tablet Take 25 mg by mouth daily.     glimepiride (AMARYL) 4 MG tablet Take 4 mg by mouth 2 (two) times daily with a meal.      hydrochlorothiazide (HYDRODIURIL) 25 MG tablet Take 25 mg by mouth daily.      Insulin Pen Needle (B-D ULTRAFINE III SHORT PEN) 31G X 8 MM MISC Inject insulin once daily.     JANUMET XR 50-1000 MG TB24 Take 1 tablet by mouth 2 (two) times daily.  6    LANTUS SOLOSTAR 100 UNIT/ML Solostar Pen Inject 25-60 Units into the skin 2 (two) times daily. Semglee    Inject 60 units in the morning & 25 units in the evening  2   losartan (COZAAR) 100 MG tablet Take 100 mg by mouth daily.     zinc gluconate 50 MG tablet Take 50 mg by mouth daily.     No current facility-administered medications for this visit.    Patient confirms/reports the following allergies:  Allergies  Allergen Reactions   Shellfish Allergy Anaphylaxis   Penicillins Hives and Other (See Comments)    PATIENT HAS HAD A PCN REACTION WITH IMMEDIATE RASH, FACIAL/TONGUE/THROAT SWELLING, SOB, OR LIGHTHEADEDNESS WITH HYPOTENSION:  #  #  YES  #  #  Has patient had a PCN reaction causing severe rash involving mucus membranes or skin necrosis: No Has patient had a PCN reaction that required hospitalization No Has patient had a PCN reaction occurring within the last 10 years: No     No orders of the defined types were placed in this encounter.   AUTHORIZATION INFORMATION Primary Insurance: 1D#: Group #:  Secondary Insurance: 1D#: Group #:  SCHEDULE INFORMATION: Date:  Time: Location:

## 2022-09-03 DIAGNOSIS — E119 Type 2 diabetes mellitus without complications: Secondary | ICD-10-CM | POA: Diagnosis not present

## 2022-09-03 DIAGNOSIS — Z961 Presence of intraocular lens: Secondary | ICD-10-CM | POA: Diagnosis not present

## 2022-09-03 DIAGNOSIS — H3321 Serous retinal detachment, right eye: Secondary | ICD-10-CM | POA: Diagnosis not present

## 2022-09-24 ENCOUNTER — Encounter: Payer: Self-pay | Admitting: Gastroenterology

## 2022-09-24 ENCOUNTER — Other Ambulatory Visit: Payer: Self-pay

## 2022-09-30 ENCOUNTER — Encounter: Payer: Self-pay | Admitting: Gastroenterology

## 2022-10-01 ENCOUNTER — Ambulatory Visit: Payer: BC Managed Care – PPO | Admitting: Certified Registered"

## 2022-10-01 ENCOUNTER — Encounter
Admission: RE | Disposition: A | Discharge status: Home / Self Care | Payer: Self-pay | Source: Home / Self Care | Attending: Gastroenterology

## 2022-10-01 ENCOUNTER — Ambulatory Visit
Admission: RE | Admit: 2022-10-01 | Discharge: 2022-10-01 | Disposition: A | Discharge status: Home / Self Care | Payer: BC Managed Care – PPO | Attending: Gastroenterology | Admitting: Gastroenterology

## 2022-10-01 DIAGNOSIS — E7800 Pure hypercholesterolemia, unspecified: Secondary | ICD-10-CM | POA: Diagnosis not present

## 2022-10-01 DIAGNOSIS — Z1211 Encounter for screening for malignant neoplasm of colon: Secondary | ICD-10-CM | POA: Diagnosis not present

## 2022-10-01 DIAGNOSIS — K621 Rectal polyp: Secondary | ICD-10-CM

## 2022-10-01 DIAGNOSIS — D214 Benign neoplasm of connective and other soft tissue of abdomen: Secondary | ICD-10-CM | POA: Diagnosis not present

## 2022-10-01 DIAGNOSIS — D124 Benign neoplasm of descending colon: Secondary | ICD-10-CM | POA: Diagnosis not present

## 2022-10-01 DIAGNOSIS — K635 Polyp of colon: Secondary | ICD-10-CM

## 2022-10-01 DIAGNOSIS — D125 Benign neoplasm of sigmoid colon: Secondary | ICD-10-CM

## 2022-10-01 DIAGNOSIS — D128 Benign neoplasm of rectum: Secondary | ICD-10-CM | POA: Diagnosis not present

## 2022-10-01 DIAGNOSIS — K6389 Other specified diseases of intestine: Secondary | ICD-10-CM | POA: Diagnosis not present

## 2022-10-01 DIAGNOSIS — D122 Benign neoplasm of ascending colon: Secondary | ICD-10-CM | POA: Diagnosis not present

## 2022-10-01 HISTORY — PX: COLONOSCOPY WITH PROPOFOL: SHX5780

## 2022-10-01 HISTORY — PX: POLYPECTOMY: SHX5525

## 2022-10-01 LAB — GLUCOSE, CAPILLARY: Glucose-Capillary: 156 mg/dL — ABNORMAL HIGH (ref 70–99)

## 2022-10-01 SURGERY — COLONOSCOPY WITH PROPOFOL
Anesthesia: General

## 2022-10-01 MED ORDER — SODIUM CHLORIDE 0.9 % IV SOLN
INTRAVENOUS | Status: DC
  Administered 2022-10-01: 20 mL/h via INTRAVENOUS

## 2022-10-01 MED ORDER — EPHEDRINE 5 MG/ML INJ
INTRAVENOUS | Status: AC
  Filled 2022-10-01: qty 5

## 2022-10-01 MED ORDER — PROPOFOL 500 MG/50ML IV EMUL
INTRAVENOUS | Status: DC | PRN
  Administered 2022-10-01: 70 mg via INTRAVENOUS
  Administered 2022-10-01: 100 ug/kg/min via INTRAVENOUS

## 2022-10-01 MED ORDER — DEXMEDETOMIDINE HCL IN NACL 200 MCG/50ML IV SOLN
INTRAVENOUS | Status: DC | PRN
Start: 2022-10-01 — End: 2022-10-01
  Administered 2022-10-01 (×2): 10 ug via INTRAVENOUS

## 2022-10-01 MED ORDER — LIDOCAINE HCL (CARDIAC) PF 100 MG/5ML IV SOSY
PREFILLED_SYRINGE | INTRAVENOUS | Status: DC | PRN
  Administered 2022-10-01: 100 mg via INTRAVENOUS

## 2022-10-01 MED ORDER — PROPOFOL 1000 MG/100ML IV EMUL
INTRAVENOUS | Status: AC
  Filled 2022-10-01: qty 100

## 2022-10-01 MED ORDER — PHENYLEPHRINE 80 MCG/ML (10ML) SYRINGE FOR IV PUSH (FOR BLOOD PRESSURE SUPPORT)
PREFILLED_SYRINGE | INTRAVENOUS | Status: DC | PRN
  Administered 2022-10-01 (×2): 80 ug via INTRAVENOUS

## 2022-10-01 MED ORDER — EPHEDRINE SULFATE (PRESSORS) 50 MG/ML IJ SOLN
INTRAMUSCULAR | Status: DC | PRN
  Administered 2022-10-01 (×2): 10 mg via INTRAVENOUS

## 2022-10-01 NOTE — Transfer of Care (Signed)
Immediate Anesthesia Transfer of Care Note  Patient: [name] [name]  Procedure(s) Performed: COLONOSCOPY WITH PROPOFOL POLYPECTOMY  Patient Location: PACU  Anesthesia Type:MAC  Level of Consciousness: awake, alert , and oriented  Airway & Oxygen Therapy: Patient Spontanous Breathing  Post-op Assessment: Report given to RN and Post -op Vital signs reviewed and stable  Post vital signs: stable  Last Vitals:  Vitals Value Taken Time  BP 108/66 10/01/22 0907  Temp 35.8 C 10/01/22 0907  Pulse 76 10/01/22 0908  Resp 21 10/01/22 0908  SpO2 99 % 10/01/22 0908  Vitals shown include unfiled device data.  Last Pain:  Vitals:   10/01/22 0907  TempSrc: Temporal  PainSc: 0-No pain         Complications: No notable events documented.

## 2022-10-01 NOTE — Anesthesia Postprocedure Evaluation (Signed)
Anesthesia Post Note  Patient: [name] [name]  Procedure(s) Performed: COLONOSCOPY WITH PROPOFOL POLYPECTOMY  Patient location during evaluation: Endoscopy Anesthesia Type: General Level of consciousness: awake and alert Pain management: pain level controlled Vital Signs Assessment: post-procedure vital signs reviewed and stable Respiratory status: spontaneous breathing, nonlabored ventilation, respiratory function stable and patient connected to nasal cannula oxygen Cardiovascular status: blood pressure returned to baseline and stable Postop Assessment: no apparent nausea or vomiting Anesthetic complications: no   No notable events documented.   Last Vitals:  Vitals:   10/01/22 0927 10/01/22 0937  BP: 110/71 114/78  Pulse: 74   Resp: (!) 29   Temp:    SpO2: 96% 96%    Last Pain:  Vitals:   10/01/22 0917  TempSrc:   PainSc: 0-No pain                 Corinda Gubler

## 2022-10-01 NOTE — Anesthesia Preprocedure Evaluation (Signed)
Anesthesia Evaluation  Patient identified by MRN, date of birth, ID band Patient awake    Reviewed: Allergy & Precautions, NPO status , Patient's Chart, lab work & pertinent test results  History of Anesthesia Complications Negative for: history of anesthetic complications  Airway Mallampati: III  TM Distance: >3 FB Neck ROM: Full    Dental no notable dental hx. (+) Teeth Intact   Pulmonary sleep apnea , neg COPD, Patient abstained from smoking.Not current smoker, former smoker   Pulmonary exam normal breath sounds clear to auscultation       Cardiovascular Exercise Tolerance: Good METShypertension, Pt. on medications (-) CAD and (-) Past MI (-) dysrhythmias  Rhythm:Regular Rate:Normal - Systolic murmurs    Neuro/Psych negative neurological ROS  negative psych ROS   GI/Hepatic ,neg GERD  ,,(+)     (-) substance abuse    Endo/Other  diabetes, Well Controlled, Insulin Dependent    Renal/GU negative Renal ROS     Musculoskeletal   Abdominal  (+) + obese  Peds  Hematology   Anesthesia Other Findings Past Medical History: No date: Diabetes mellitus without complication (HCC)     Comment:  type 2  No date: Heart murmur     Comment:  Hx: of as achild No date: Hypertension No date: Inguinal hernia     Comment:  Hx: of left groin No date: Kidney stones     Comment:  Hx: of No date: OA (osteoarthritis) of hip     Comment:  Hx: of No date: Sleep apnea     Comment:  No CPAP  Reproductive/Obstetrics                             Anesthesia Physical Anesthesia Plan  ASA: 3  Anesthesia Plan: General   Post-op Pain Management: Minimal or no pain anticipated   Induction: Intravenous  PONV Risk Score and Plan: 2 and Propofol infusion, TIVA and Ondansetron  Airway Management Planned: Nasal Cannula  Additional Equipment: None  Intra-op Plan:   Post-operative Plan:   Informed  Consent: I have reviewed the patients History and Physical, chart, labs and discussed the procedure including the risks, benefits and alternatives for the proposed anesthesia with the patient or authorized representative who has indicated his/her understanding and acceptance.     Dental advisory given  Plan Discussed with: CRNA and Surgeon  Anesthesia Plan Comments: (Discussed risks of anesthesia with patient, including possibility of difficulty with spontaneous ventilation under anesthesia necessitating airway intervention, PONV, and rare risks such as cardiac or respiratory or neurological events, and allergic reactions. Discussed the role of CRNA in patient's perioperative care. Patient understands.)       Anesthesia Quick Evaluation

## 2022-10-01 NOTE — H&P (Signed)
Arlyss Repress, MD [address]  [address]  [place], [place] [address]  Main: [phone]  Fax: [phone] Pager: [phone]  Primary Care Physician:  Mariea Stable, NP Primary Gastroenterologist:  Dr. Arlyss Repress  Pre-Procedure History & Physical: HPI:  [name] [name] is a 62 y.o. male is here for a colonoscopy.   Past Medical History:  Diagnosis Date   Diabetes mellitus without complication (HCC)    type 2    Heart murmur    Hx: of as achild   Hypertension    Inguinal hernia    Hx: of left groin   Kidney stones    Hx: of   OA (osteoarthritis) of hip    Hx: of   Sleep apnea    No CPAP    Past Surgical History:  Procedure Laterality Date   CATARACT EXTRACTION W/PHACO Right 03/10/2021   Procedure: CATARACT EXTRACTION PHACO AND INTRAOCULAR LENS PLACEMENT (IOC) RIGHT DIABETIC 20.16 01:59.8;  Surgeon: [name], MD;  Location: [facility];  Service: Ophthalmology;  Laterality: Right;  Diabetic   COLONOSCOPY     Hx: of   FOOT ARTHRODESIS Left 05/29/2014   Procedure: Left Triple Arthrodesis, Possible Achilles Lengthening;  Surgeon: Eldred Manges, MD;  Location: [facility] OR;  Service: Orthopedics;  Laterality: Left;   FOOT ARTHRODESIS Right 01/10/2015   Procedure: Right Triple Arthrodesis, Achilles Lengthening;  Surgeon: Eldred Manges, MD;  Location: [facility] OR;  Service: Orthopedics;  Laterality: Right;   FOOT ARTHRODESIS, TRIPLE Left 05/29/2014   HARDWARE REMOVAL Left 01/10/2015   Procedure: Left Foot Screw Removal ;  Surgeon: Eldred Manges, MD;  Location: [facility] OR;  Service: Orthopedics;  Laterality: Left;   TOTAL HIP ARTHROPLASTY Left 03/02/2013   DR [name]   TOTAL HIP ARTHROPLASTY Left 03/02/2013   Procedure: TOTAL HIP ARTHROPLASTY ANTERIOR APPROACH;  Surgeon: Eldred Manges, MD;  Location: [facility] OR;  Service: Orthopedics;  Laterality: Left;  Left Total Hip Arthroplasty Direct Anterior Approach   TOTAL HIP ARTHROPLASTY Right 05/06/2017   Procedure: RIGHT TOTAL  HIP ARTHROPLASTY DIRECT ANTERIOR;  Surgeon: Eldred Manges, MD;  Location: [facility] OR;  Service: Orthopedics;  Laterality: Right;    Prior to Admission medications   Medication Sig Start Date End Date Taking? Authorizing Provider  Ascorbic Acid (VITAMIN C) 1000 MG tablet Take 1,000 mg by mouth daily.    [provider]  aspirin EC 81 MG tablet Take 81 mg by mouth daily.    [provider]  atorvastatin (LIPITOR) 10 MG tablet Take 10 mg by mouth daily.    [provider]  b complex vitamins tablet Take 1 tablet by mouth daily.    [provider]  carvedilol (COREG) 6.25 MG tablet Take 6.25 mg by mouth 2 (two) times daily with a meal.    [provider]  Cholecalciferol (VITAMIN D-3) 125 MCG (5000 UT) TABS Take by mouth daily.    [provider]  Cinnamon 500 MG TABS Take 2,000 mg by mouth daily.    [provider]  CONTOUR NEXT TEST test strip  01/26/17   [provider]  empagliflozin (JARDIANCE) 25 MG TABS tablet Take 25 mg by mouth daily. Patient not taking: Reported on 08/12/2022    [provider]  glimepiride (AMARYL) 4 MG tablet Take 4 mg by mouth 2 (two) times daily with a meal.     [provider]  hydrochlorothiazide (HYDRODIURIL) 25 MG tablet Take 25 mg by mouth  daily.     [provider]  Insulin Pen Needle (B-D ULTRAFINE III SHORT PEN) 31G X 8 MM MISC Inject insulin once daily. 05/31/12   [provider]  JANUMET XR 50-1000 MG TB24 Take 1 tablet by mouth 2 (two) times daily. 07/25/17   [provider]  losartan (COZAAR) 100 MG tablet Take 100 mg by mouth daily.    [provider]  zinc gluconate 50 MG tablet Take 50 mg by mouth daily.    [provider]    Allergies as of 08/12/2022 - Review Complete 08/12/2022  Allergen Reaction Noted   Shellfish allergy Anaphylaxis 02/20/2013   Penicillins Hives and Other (See Comments) 02/20/2013    Family History   Problem Relation Age of Onset   Diabetes Mother    Hypertension Mother    Cancer - Other Mother    Cancer - Lung Father    Arthritis Sister    Heart disease Sister     Social History   Socioeconomic History   Marital status: Married    Spouse name: Not on file   Number of children: Not on file   Years of education: Not on file   Highest education level: Not on file  Occupational History   Not on file  Tobacco Use   Smoking status: Former    Current packs/day: 0.00    Types: Cigars, Cigarettes    Quit date: 1998    Years since quitting: 26.6   Smokeless tobacco: Current    Types: Snuff   Tobacco comments:    Quit smoking 1998  Vaping Use   Vaping status: Never Used  Substance and Sexual Activity   Alcohol use: Yes    Alcohol/week: 2.0 standard drinks of alcohol    Types: 2 Shots of liquor per week    Comment: once a week    Drug use: No   Sexual activity: Not on file  Other Topics Concern   Not on file  Social History Narrative   Not on file   Social Determinants of Health   Financial Resource Strain: Not on file  Food Insecurity: Not on file  Transportation Needs: Not on file  Physical Activity: Not on file  Stress: Not on file  Social Connections: Not on file  Intimate Partner Violence: Not on file    Review of Systems: See HPI, otherwise negative ROS  Physical Exam: BP 127/83   Pulse 76   Temp (!) 96.9 F (36.1 C) (Temporal)   Resp 20   Ht 6\' 2"  (1.88 m)   Wt (!) 140.3 kg   SpO2 98%   BMI 39.72 kg/m  General:   Alert,  pleasant and cooperative in NAD Head:  Normocephalic and atraumatic. Neck:  Supple; no masses or thyromegaly. Lungs:  Clear throughout to auscultation.    Heart:  Regular rate and rhythm. Abdomen:  Soft, nontender and nondistended. Normal bowel sounds, without guarding, and without rebound.   Neurologic:  Alert and  oriented x4;  grossly normal neurologically.  Impression/Plan: [name] [name] is here for an  colonoscopy to be performed for colon cancer screening  Risks, benefits, limitations, and alternatives regarding  colonoscopy have been reviewed with the patient.  Questions have been answered.  All parties agreeable.   Lannette Donath, MD  10/01/2022, 8:26 AM

## 2022-10-01 NOTE — Op Note (Signed)
[facility] Gastroenterology Patient Name: [name] [name] Procedure Date: 10/01/2022 8:23 AM MRN: [id number] Account #: [id number] Date of Birth: 06/04/1960 Admit Type: Outpatient Age: 62 Room: [facility] ENDO ROOM 2 Gender: Male Note Status: Finalized Instrument Name: Colonoscope [id number] Procedure:             Colonoscopy Indications:           Screening for colorectal malignant neoplasm, Last                         colonoscopy: November 2012 Providers:             Toney Reil MD, MD Referring MD:          Dennie Maizes (Referring MD) Medicines:             General Anesthesia Complications:         No immediate complications. Estimated blood loss: None. Procedure:             Pre-Anesthesia Assessment:                        - Prior to the procedure, a History and Physical was                         performed, and patient medications and allergies were                         reviewed. The patient is competent. The risks and                         benefits of the procedure and the sedation options and                         risks were discussed with the patient. All questions                         were answered and informed consent was obtained.                         Patient identification and proposed procedure were                         verified by the physician, the nurse, the                         anesthesiologist, the anesthetist and the technician                         in the pre-procedure area in the procedure room in the                         endoscopy suite. Mental Status Examination: alert and                         oriented. Airway Examination: normal oropharyngeal                         airway and neck mobility. Respiratory Examination:  clear to auscultation. CV Examination: normal.                         Prophylactic Antibiotics: The patient does not require                         prophylactic antibiotics.  Prior Anticoagulants: The                         patient has taken no anticoagulant or antiplatelet                         agents. ASA Grade Assessment: III - A patient with                         severe systemic disease. After reviewing the risks and                         benefits, the patient was deemed in satisfactory                         condition to undergo the procedure. The anesthesia                         plan was to use general anesthesia. Immediately prior                         to administration of medications, the patient was                         re-assessed for adequacy to receive sedatives. The                         heart rate, respiratory rate, oxygen saturations,                         blood pressure, adequacy of pulmonary ventilation, and                         response to care were monitored throughout the                         procedure. The physical status of the patient was                         re-assessed after the procedure.                        After obtaining informed consent, the colonoscope was                         passed under direct vision. Throughout the procedure,                         the patient's blood pressure, pulse, and oxygen                         saturations were monitored continuously. The  Colonoscope was introduced through the anus and                         advanced to the the cecum, identified by appendiceal                         orifice and ileocecal valve. The colonoscopy was                         performed without difficulty. The patient tolerated                         the procedure well. The quality of the bowel                         preparation was evaluated using the BBPS [facility] Bowel                         Preparation Scale) with scores of: Right Colon = 3,                         Transverse Colon = 3 and Left Colon = 3 (entire mucosa                         seen well with no  residual staining, small fragments                         of stool or opaque liquid). The total BBPS score                         equals 9. The ileocecal valve, appendiceal orifice,                         and rectum were photographed. Findings:      The perianal and digital rectal examinations were normal. Pertinent       negatives include normal sphincter tone and no palpable rectal lesions.      A 4 mm polyp was found in the ascending colon. The polyp was sessile.       The polyp was removed with a cold snare. Resection and retrieval were       complete.      Two sessile polyps were found in the descending colon. The polyps were 3       to 4 mm in size. These polyps were removed with a cold snare. Resection       and retrieval were complete.      Seven sessile polyps were found in the rectum and sigmoid colon. The       polyps were 4 to 7 mm in size. These polyps were removed with a cold       snare. Resection and retrieval were complete. Estimated blood loss: none.      The retroflexed view of the distal rectum and anal verge was normal and       showed no anal or rectal abnormalities. Impression:            - One 4 mm polyp in the ascending colon, removed with  a cold snare. Resected and retrieved.                        - Two 3 to 4 mm polyps in the descending colon,                         removed with a cold snare. Resected and retrieved.                        - Seven 4 to 7 mm polyps in the rectum and in the                         sigmoid colon, removed with a cold snare. Resected and                         retrieved.                        - The distal rectum and anal verge are normal on                         retroflexion view. Recommendation:        - Discharge patient to home (with escort).                        - Resume previous diet today.                        - Continue present medications.                        - Await pathology  results.                        - Repeat colonoscopy in 1- 3 years for surveillance of                         multiple polyps. Procedure Code(s):     --- Professional ---                        [phone], Colonoscopy, flexible; with removal of                         tumor(s), polyp(s), or other lesion(s) by snare                         technique Diagnosis Code(s):     --- Professional ---                        Z12.11, Encounter for screening for malignant neoplasm                         of colon                        D12.2, Benign neoplasm of ascending colon                        D12.4, Benign neoplasm of descending colon  D12.8, Benign neoplasm of rectum                        D12.5, Benign neoplasm of sigmoid colon CPT copyright 2022 [employer]. All rights reserved. The codes documented in this report are preliminary and upon coder review may  be revised to meet current compliance requirements. Dr. Libby Maw Toney Reil MD, MD 10/01/2022 9:03:08 AM This report has been signed electronically. Number of Addenda: 0 Note Initiated On: 10/01/2022 8:23 AM Scope Withdrawal Time: 0 hours 18 minutes 55 seconds  Total Procedure Duration: 0 hours 22 minutes 17 seconds  Estimated Blood Loss:  Estimated blood loss: none.      [facility]

## 2022-10-04 ENCOUNTER — Encounter: Payer: Self-pay | Admitting: Gastroenterology

## 2022-10-04 DIAGNOSIS — H3321 Serous retinal detachment, right eye: Secondary | ICD-10-CM | POA: Diagnosis not present

## 2022-10-04 NOTE — Group Note (Deleted)

## 2022-10-05 ENCOUNTER — Encounter: Payer: Self-pay | Admitting: Gastroenterology

## 2022-11-03 DIAGNOSIS — Z91013 Allergy to seafood: Secondary | ICD-10-CM | POA: Diagnosis not present

## 2022-11-03 DIAGNOSIS — Z880 Allergy status to penicillin: Secondary | ICD-10-CM | POA: Diagnosis not present

## 2022-11-03 DIAGNOSIS — E785 Hyperlipidemia, unspecified: Secondary | ICD-10-CM | POA: Diagnosis not present

## 2022-11-03 DIAGNOSIS — Z79899 Other long term (current) drug therapy: Secondary | ICD-10-CM | POA: Diagnosis not present

## 2022-11-03 DIAGNOSIS — I10 Essential (primary) hypertension: Secondary | ICD-10-CM | POA: Diagnosis not present

## 2022-11-03 DIAGNOSIS — Z6841 Body Mass Index (BMI) 40.0 and over, adult: Secondary | ICD-10-CM | POA: Diagnosis not present

## 2022-11-03 DIAGNOSIS — H33001 Unspecified retinal detachment with retinal break, right eye: Secondary | ICD-10-CM | POA: Diagnosis not present

## 2022-11-03 DIAGNOSIS — E119 Type 2 diabetes mellitus without complications: Secondary | ICD-10-CM | POA: Diagnosis not present

## 2022-11-03 DIAGNOSIS — Z7982 Long term (current) use of aspirin: Secondary | ICD-10-CM | POA: Diagnosis not present

## 2022-11-04 DIAGNOSIS — H33001 Unspecified retinal detachment with retinal break, right eye: Secondary | ICD-10-CM | POA: Diagnosis not present

## 2022-12-21 DIAGNOSIS — H3321 Serous retinal detachment, right eye: Secondary | ICD-10-CM | POA: Diagnosis not present

## 2023-02-15 DIAGNOSIS — E1165 Type 2 diabetes mellitus with hyperglycemia: Secondary | ICD-10-CM | POA: Diagnosis not present

## 2023-02-28 DIAGNOSIS — H26491 Other secondary cataract, right eye: Secondary | ICD-10-CM | POA: Diagnosis not present

## 2023-02-28 DIAGNOSIS — Z961 Presence of intraocular lens: Secondary | ICD-10-CM | POA: Diagnosis not present

## 2023-02-28 DIAGNOSIS — H3321 Serous retinal detachment, right eye: Secondary | ICD-10-CM | POA: Diagnosis not present

## 2023-02-28 DIAGNOSIS — E119 Type 2 diabetes mellitus without complications: Secondary | ICD-10-CM | POA: Diagnosis not present

## 2023-03-01 DIAGNOSIS — I10 Essential (primary) hypertension: Secondary | ICD-10-CM | POA: Diagnosis not present

## 2023-03-01 DIAGNOSIS — E1165 Type 2 diabetes mellitus with hyperglycemia: Secondary | ICD-10-CM | POA: Diagnosis not present

## 2023-03-01 DIAGNOSIS — E7800 Pure hypercholesterolemia, unspecified: Secondary | ICD-10-CM | POA: Diagnosis not present

## 2023-03-07 DIAGNOSIS — E119 Type 2 diabetes mellitus without complications: Secondary | ICD-10-CM | POA: Diagnosis not present

## 2023-05-30 DIAGNOSIS — E1165 Type 2 diabetes mellitus with hyperglycemia: Secondary | ICD-10-CM | POA: Diagnosis not present

## 2023-06-06 DIAGNOSIS — I10 Essential (primary) hypertension: Secondary | ICD-10-CM | POA: Diagnosis not present

## 2023-06-06 DIAGNOSIS — E1165 Type 2 diabetes mellitus with hyperglycemia: Secondary | ICD-10-CM | POA: Diagnosis not present

## 2023-09-05 DIAGNOSIS — H3321 Serous retinal detachment, right eye: Secondary | ICD-10-CM | POA: Diagnosis not present

## 2023-09-05 DIAGNOSIS — E119 Type 2 diabetes mellitus without complications: Secondary | ICD-10-CM | POA: Diagnosis not present

## 2023-09-05 DIAGNOSIS — H26491 Other secondary cataract, right eye: Secondary | ICD-10-CM | POA: Diagnosis not present

## 2023-09-05 DIAGNOSIS — Z961 Presence of intraocular lens: Secondary | ICD-10-CM | POA: Diagnosis not present

## 2023-10-03 DIAGNOSIS — E1165 Type 2 diabetes mellitus with hyperglycemia: Secondary | ICD-10-CM | POA: Diagnosis not present

## 2023-10-10 DIAGNOSIS — N189 Chronic kidney disease, unspecified: Secondary | ICD-10-CM | POA: Diagnosis not present

## 2023-10-10 DIAGNOSIS — I10 Essential (primary) hypertension: Secondary | ICD-10-CM | POA: Diagnosis not present

## 2023-10-10 DIAGNOSIS — Z23 Encounter for immunization: Secondary | ICD-10-CM | POA: Diagnosis not present

## 2023-10-10 DIAGNOSIS — R3129 Other microscopic hematuria: Secondary | ICD-10-CM | POA: Diagnosis not present

## 2023-10-10 DIAGNOSIS — E1165 Type 2 diabetes mellitus with hyperglycemia: Secondary | ICD-10-CM | POA: Diagnosis not present

## 2023-11-15 DIAGNOSIS — Z961 Presence of intraocular lens: Secondary | ICD-10-CM | POA: Diagnosis not present

## 2023-11-15 DIAGNOSIS — E119 Type 2 diabetes mellitus without complications: Secondary | ICD-10-CM | POA: Diagnosis not present

## 2023-11-15 DIAGNOSIS — H26491 Other secondary cataract, right eye: Secondary | ICD-10-CM | POA: Diagnosis not present

## 2023-11-15 DIAGNOSIS — H3321 Serous retinal detachment, right eye: Secondary | ICD-10-CM | POA: Diagnosis not present

## 2023-11-21 DIAGNOSIS — H26491 Other secondary cataract, right eye: Secondary | ICD-10-CM | POA: Diagnosis not present
